# Patient Record
Sex: Female | Born: 1955 | Race: Black or African American | Hispanic: No | Smoking: Current every day smoker
Health system: Southern US, Community
[De-identification: ages and names within clinical notes are randomized; demographics above are authoritative.]

## PROBLEM LIST (undated history)

## (undated) DIAGNOSIS — I219 Acute myocardial infarction, unspecified: Secondary | ICD-10-CM

## (undated) DIAGNOSIS — R7303 Prediabetes: Secondary | ICD-10-CM

## (undated) DIAGNOSIS — Z87442 Personal history of urinary calculi: Secondary | ICD-10-CM

## (undated) DIAGNOSIS — N6315 Unspecified lump in the right breast, overlapping quadrants: Secondary | ICD-10-CM

## (undated) DIAGNOSIS — Z973 Presence of spectacles and contact lenses: Secondary | ICD-10-CM

## (undated) DIAGNOSIS — Z8619 Personal history of other infectious and parasitic diseases: Secondary | ICD-10-CM

## (undated) DIAGNOSIS — M1990 Unspecified osteoarthritis, unspecified site: Secondary | ICD-10-CM

## (undated) DIAGNOSIS — I10 Essential (primary) hypertension: Secondary | ICD-10-CM

## (undated) DIAGNOSIS — H9319 Tinnitus, unspecified ear: Secondary | ICD-10-CM

## (undated) DIAGNOSIS — E785 Hyperlipidemia, unspecified: Secondary | ICD-10-CM

## (undated) DIAGNOSIS — E119 Type 2 diabetes mellitus without complications: Secondary | ICD-10-CM

## (undated) HISTORY — DX: Essential (primary) hypertension: I10

## (undated) HISTORY — PX: LITHOTRIPSY: SUR834

## (undated) HISTORY — DX: Unspecified lump in the right breast, overlapping quadrants: N63.15

## (undated) HISTORY — PX: BREAST LUMPECTOMY: SHX2

---

## 1994-11-16 DIAGNOSIS — I219 Acute myocardial infarction, unspecified: Secondary | ICD-10-CM | POA: Insufficient documentation

## 1994-11-16 HISTORY — PX: CORONARY ANGIOPLASTY: SHX604

## 1994-11-16 HISTORY — DX: Acute myocardial infarction, unspecified: I21.9

## 2001-06-02 ENCOUNTER — Ambulatory Visit (HOSPITAL_COMMUNITY): Admission: RE | Admit: 2001-06-02 | Discharge: 2001-06-02 | Payer: Self-pay | Admitting: Internal Medicine

## 2001-06-02 ENCOUNTER — Encounter: Payer: Self-pay | Admitting: Internal Medicine

## 2001-06-16 ENCOUNTER — Other Ambulatory Visit: Admission: RE | Admit: 2001-06-16 | Discharge: 2001-06-16 | Payer: Self-pay | Admitting: Internal Medicine

## 2002-06-26 ENCOUNTER — Emergency Department (HOSPITAL_COMMUNITY): Admission: EM | Admit: 2002-06-26 | Discharge: 2002-06-26 | Payer: Self-pay | Admitting: Emergency Medicine

## 2002-06-26 ENCOUNTER — Encounter: Payer: Self-pay | Admitting: Emergency Medicine

## 2002-07-06 ENCOUNTER — Ambulatory Visit (HOSPITAL_COMMUNITY): Admission: RE | Admit: 2002-07-06 | Discharge: 2002-07-06 | Payer: Self-pay | Admitting: Urology

## 2002-07-06 ENCOUNTER — Encounter: Payer: Self-pay | Admitting: Urology

## 2002-07-21 ENCOUNTER — Ambulatory Visit (HOSPITAL_COMMUNITY): Admission: RE | Admit: 2002-07-21 | Discharge: 2002-07-21 | Payer: Self-pay | Admitting: Internal Medicine

## 2002-07-21 ENCOUNTER — Encounter: Payer: Self-pay | Admitting: Internal Medicine

## 2002-09-06 ENCOUNTER — Ambulatory Visit (HOSPITAL_COMMUNITY): Admission: RE | Admit: 2002-09-06 | Discharge: 2002-09-06 | Payer: Self-pay | Admitting: Urology

## 2002-09-06 ENCOUNTER — Encounter: Payer: Self-pay | Admitting: Urology

## 2003-08-09 ENCOUNTER — Ambulatory Visit (HOSPITAL_COMMUNITY): Admission: RE | Admit: 2003-08-09 | Discharge: 2003-08-09 | Payer: Self-pay | Admitting: Family Medicine

## 2003-08-09 ENCOUNTER — Encounter: Payer: Self-pay | Admitting: Family Medicine

## 2003-08-24 ENCOUNTER — Ambulatory Visit (HOSPITAL_COMMUNITY): Admission: RE | Admit: 2003-08-24 | Discharge: 2003-08-24 | Payer: Self-pay | Admitting: Family Medicine

## 2003-08-24 ENCOUNTER — Encounter: Payer: Self-pay | Admitting: Family Medicine

## 2003-09-11 ENCOUNTER — Ambulatory Visit (HOSPITAL_COMMUNITY): Admission: RE | Admit: 2003-09-11 | Discharge: 2003-09-11 | Payer: Self-pay | Admitting: General Surgery

## 2004-08-29 ENCOUNTER — Ambulatory Visit (HOSPITAL_COMMUNITY): Admission: RE | Admit: 2004-08-29 | Discharge: 2004-08-29 | Payer: Self-pay | Admitting: Family Medicine

## 2005-08-31 ENCOUNTER — Ambulatory Visit (HOSPITAL_COMMUNITY): Admission: RE | Admit: 2005-08-31 | Discharge: 2005-08-31 | Payer: Self-pay | Admitting: Family Medicine

## 2005-09-03 ENCOUNTER — Ambulatory Visit (HOSPITAL_COMMUNITY): Admission: RE | Admit: 2005-09-03 | Discharge: 2005-09-03 | Payer: Self-pay | Admitting: Family Medicine

## 2005-09-09 ENCOUNTER — Ambulatory Visit: Payer: Self-pay | Admitting: *Deleted

## 2005-11-19 ENCOUNTER — Ambulatory Visit (HOSPITAL_COMMUNITY): Admission: RE | Admit: 2005-11-19 | Discharge: 2005-11-19 | Payer: Self-pay | Admitting: General Surgery

## 2005-11-19 ENCOUNTER — Encounter (INDEPENDENT_AMBULATORY_CARE_PROVIDER_SITE_OTHER): Payer: Self-pay | Admitting: General Surgery

## 2005-12-31 ENCOUNTER — Ambulatory Visit (HOSPITAL_COMMUNITY): Admission: RE | Admit: 2005-12-31 | Discharge: 2005-12-31 | Payer: Self-pay | Admitting: Family Medicine

## 2006-01-06 ENCOUNTER — Ambulatory Visit: Payer: Self-pay | Admitting: Orthopedic Surgery

## 2006-12-07 ENCOUNTER — Ambulatory Visit (HOSPITAL_COMMUNITY): Admission: RE | Admit: 2006-12-07 | Discharge: 2006-12-07 | Payer: Self-pay | Admitting: Family Medicine

## 2006-12-07 ENCOUNTER — Ambulatory Visit: Payer: Self-pay | Admitting: Family Medicine

## 2006-12-13 ENCOUNTER — Encounter: Payer: Self-pay | Admitting: Family Medicine

## 2006-12-13 DIAGNOSIS — I2510 Atherosclerotic heart disease of native coronary artery without angina pectoris: Secondary | ICD-10-CM | POA: Insufficient documentation

## 2006-12-13 DIAGNOSIS — I252 Old myocardial infarction: Secondary | ICD-10-CM | POA: Insufficient documentation

## 2006-12-13 DIAGNOSIS — E785 Hyperlipidemia, unspecified: Secondary | ICD-10-CM | POA: Insufficient documentation

## 2006-12-13 DIAGNOSIS — M1990 Unspecified osteoarthritis, unspecified site: Secondary | ICD-10-CM | POA: Insufficient documentation

## 2006-12-13 DIAGNOSIS — Z860100 Personal history of colon polyps, unspecified: Secondary | ICD-10-CM | POA: Insufficient documentation

## 2006-12-13 DIAGNOSIS — I10 Essential (primary) hypertension: Secondary | ICD-10-CM | POA: Insufficient documentation

## 2006-12-13 DIAGNOSIS — F17200 Nicotine dependence, unspecified, uncomplicated: Secondary | ICD-10-CM | POA: Insufficient documentation

## 2006-12-13 LAB — CONVERTED CEMR LAB
ALT: 36 units/L — ABNORMAL HIGH (ref 0–35)
AST: 19 units/L (ref 0–37)
Albumin: 3.8 g/dL (ref 3.5–5.2)
Alkaline Phosphatase: 206 units/L — ABNORMAL HIGH (ref 39–117)
BUN: 12 mg/dL (ref 6–23)
Basophils Absolute: 0 10*3/uL (ref 0.0–0.1)
Basophils Relative: 1 % (ref 0–1)
CO2: 21 meq/L (ref 19–32)
Calcium: 9 mg/dL (ref 8.4–10.5)
Chloride: 104 meq/L (ref 96–112)
Cholesterol: 146 mg/dL (ref 0–200)
Creatinine, Ser: 0.75 mg/dL (ref 0.40–1.20)
Eosinophils Absolute: 0.1 10*3/uL (ref 0.0–0.7)
Eosinophils Relative: 2 % (ref 0–5)
Glucose, Bld: 115 mg/dL — ABNORMAL HIGH (ref 70–99)
HCT: 42.4 % (ref 36.0–46.0)
HDL: 38 mg/dL — ABNORMAL LOW (ref >39)
Hemoglobin: 14.3 g/dL (ref 12.0–15.0)
LDL Cholesterol: 87 mg/dL (ref 0–99)
Lymphocytes Relative: 31 % (ref 12–46)
Lymphs Abs: 2.4 10*3/uL (ref 0.7–3.3)
MCHC: 33.7 g/dL (ref 30.0–36.0)
MCV: 89.3 fL (ref 78.0–100.0)
Monocytes Absolute: 0.5 10*3/uL (ref 0.2–0.7)
Monocytes Relative: 6 % (ref 3–11)
Neutro Abs: 4.7 10*3/uL (ref 1.7–7.7)
Neutrophils Relative %: 61 % (ref 43–77)
Platelets: 236 10*3/uL (ref 150–400)
Potassium: 3.8 meq/L (ref 3.5–5.3)
RBC: 4.75 M/uL (ref 3.87–5.11)
RDW: 14.2 % — ABNORMAL HIGH (ref 11.5–14.0)
Sodium: 141 meq/L (ref 135–145)
TSH: 2.422 microintl units/mL (ref 0.350–5.50)
Total Bilirubin: 0.5 mg/dL (ref 0.3–1.2)
Total CHOL/HDL Ratio: 3.8
Total Protein: 6.8 g/dL (ref 6.0–8.3)
Triglycerides: 105 mg/dL (ref <150)
VLDL: 21 mg/dL (ref 0–40)
WBC: 7.7 10*3/uL (ref 4.0–10.5)

## 2007-01-13 ENCOUNTER — Encounter (INDEPENDENT_AMBULATORY_CARE_PROVIDER_SITE_OTHER): Payer: Self-pay | Admitting: Family Medicine

## 2008-07-13 ENCOUNTER — Ambulatory Visit: Payer: Self-pay | Admitting: Family Medicine

## 2008-07-13 LAB — CONVERTED CEMR LAB
Bilirubin Urine: NEGATIVE
Cholesterol, target level: 200 mg/dL
Glucose, Urine, Semiquant: NEGATIVE
HDL goal, serum: 40 mg/dL
Ketones, urine, test strip: NEGATIVE
LDL Goal: 100 mg/dL
Nitrite: NEGATIVE
Protein, U semiquant: 30
Specific Gravity, Urine: 1.015
Urobilinogen, UA: 0.2
pH: 6

## 2008-07-14 ENCOUNTER — Encounter (INDEPENDENT_AMBULATORY_CARE_PROVIDER_SITE_OTHER): Payer: Self-pay | Admitting: Family Medicine

## 2008-07-17 LAB — CONVERTED CEMR LAB
Bilirubin Urine: NEGATIVE
Ketones, ur: NEGATIVE mg/dL
Leukocytes, UA: NEGATIVE
Nitrite: NEGATIVE
Protein, ur: NEGATIVE mg/dL
Specific Gravity, Urine: 1.013 (ref 1.005–1.03)
Urine Glucose: NEGATIVE mg/dL
Urobilinogen, UA: 0.2 (ref 0.0–1.0)
pH: 6 (ref 5.0–8.0)

## 2008-07-20 ENCOUNTER — Encounter (INDEPENDENT_AMBULATORY_CARE_PROVIDER_SITE_OTHER): Payer: Self-pay | Admitting: Family Medicine

## 2008-07-27 LAB — CONVERTED CEMR LAB
ALT: 18 units/L (ref 0–35)
AST: 16 units/L (ref 0–37)
Albumin: 3.9 g/dL (ref 3.5–5.2)
Alkaline Phosphatase: 176 units/L — ABNORMAL HIGH (ref 39–117)
BUN: 10 mg/dL (ref 6–23)
Basophils Absolute: 0.1 10*3/uL (ref 0.0–0.1)
Basophils Relative: 1 % (ref 0–1)
CO2: 25 meq/L (ref 19–32)
Calcium: 9.2 mg/dL (ref 8.4–10.5)
Chloride: 106 meq/L (ref 96–112)
Cholesterol: 316 mg/dL — ABNORMAL HIGH (ref 0–200)
Creatinine, Ser: 0.92 mg/dL (ref 0.40–1.20)
Eosinophils Absolute: 0.1 10*3/uL (ref 0.0–0.7)
Eosinophils Relative: 2 % (ref 0–5)
Glucose, Bld: 117 mg/dL — ABNORMAL HIGH (ref 70–99)
HCT: 46.7 % — ABNORMAL HIGH (ref 36.0–46.0)
HDL: 45 mg/dL (ref >39)
Hemoglobin: 15.2 g/dL — ABNORMAL HIGH (ref 12.0–15.0)
LDL Cholesterol: 242 mg/dL — ABNORMAL HIGH (ref 0–99)
Lymphocytes Relative: 43 % (ref 12–46)
Lymphs Abs: 3 10*3/uL (ref 0.7–4.0)
MCHC: 32.5 g/dL (ref 30.0–36.0)
MCV: 89 fL (ref 78.0–100.0)
Monocytes Absolute: 0.5 10*3/uL (ref 0.1–1.0)
Monocytes Relative: 7 % (ref 3–12)
Neutro Abs: 3.4 10*3/uL (ref 1.7–7.7)
Neutrophils Relative %: 48 % (ref 43–77)
Platelets: 231 10*3/uL (ref 150–400)
Potassium: 4.2 meq/L (ref 3.5–5.3)
RBC: 5.25 M/uL — ABNORMAL HIGH (ref 3.87–5.11)
RDW: 14.9 % (ref 11.5–15.5)
Sodium: 141 meq/L (ref 135–145)
TSH: 2.584 microintl units/mL (ref 0.350–4.50)
Total Bilirubin: 0.5 mg/dL (ref 0.3–1.2)
Total CHOL/HDL Ratio: 7
Total Protein: 7.3 g/dL (ref 6.0–8.3)
Triglycerides: 147 mg/dL (ref <150)
VLDL: 29 mg/dL (ref 0–40)
WBC: 7.1 10*3/uL (ref 4.0–10.5)

## 2008-07-31 ENCOUNTER — Other Ambulatory Visit: Admission: RE | Admit: 2008-07-31 | Discharge: 2008-07-31 | Payer: Self-pay | Admitting: Family Medicine

## 2008-07-31 ENCOUNTER — Encounter (INDEPENDENT_AMBULATORY_CARE_PROVIDER_SITE_OTHER): Payer: Self-pay | Admitting: Family Medicine

## 2008-07-31 ENCOUNTER — Ambulatory Visit: Payer: Self-pay | Admitting: Family Medicine

## 2008-07-31 DIAGNOSIS — Z87442 Personal history of urinary calculi: Secondary | ICD-10-CM | POA: Insufficient documentation

## 2008-07-31 DIAGNOSIS — D751 Secondary polycythemia: Secondary | ICD-10-CM | POA: Insufficient documentation

## 2008-07-31 DIAGNOSIS — R748 Abnormal levels of other serum enzymes: Secondary | ICD-10-CM | POA: Insufficient documentation

## 2008-07-31 DIAGNOSIS — R3129 Other microscopic hematuria: Secondary | ICD-10-CM | POA: Insufficient documentation

## 2008-07-31 LAB — CONVERTED CEMR LAB
Glucose, Urine, Semiquant: NEGATIVE
Ketones, urine, test strip: NEGATIVE
LDL Goal: 70 mg/dL
Nitrite: NEGATIVE
Protein, U semiquant: 30
Specific Gravity, Urine: 1.015
Urobilinogen, UA: 1
pH: 6.5

## 2008-08-02 ENCOUNTER — Ambulatory Visit (HOSPITAL_COMMUNITY): Admission: RE | Admit: 2008-08-02 | Discharge: 2008-08-02 | Payer: Self-pay | Admitting: *Deleted

## 2008-08-02 ENCOUNTER — Encounter (INDEPENDENT_AMBULATORY_CARE_PROVIDER_SITE_OTHER): Payer: Self-pay | Admitting: Family Medicine

## 2008-08-02 DIAGNOSIS — M948X9 Other specified disorders of cartilage, unspecified sites: Secondary | ICD-10-CM | POA: Insufficient documentation

## 2008-08-08 ENCOUNTER — Ambulatory Visit (HOSPITAL_COMMUNITY): Admission: RE | Admit: 2008-08-08 | Discharge: 2008-08-08 | Payer: Self-pay | Admitting: Family Medicine

## 2008-08-24 ENCOUNTER — Ambulatory Visit (HOSPITAL_COMMUNITY): Admission: RE | Admit: 2008-08-24 | Discharge: 2008-08-24 | Payer: Self-pay | Admitting: Urology

## 2008-08-29 ENCOUNTER — Ambulatory Visit: Payer: Self-pay | Admitting: Family Medicine

## 2008-08-30 ENCOUNTER — Encounter (INDEPENDENT_AMBULATORY_CARE_PROVIDER_SITE_OTHER): Payer: Self-pay | Admitting: Family Medicine

## 2008-08-31 LAB — CONVERTED CEMR LAB
ALT: 17 units/L (ref 0–35)
AST: 15 units/L (ref 0–37)
Albumin: 4.2 g/dL (ref 3.5–5.2)
Alkaline Phosphatase: 187 units/L — ABNORMAL HIGH (ref 39–117)
BUN: 10 mg/dL (ref 6–23)
CO2: 22 meq/L (ref 19–32)
Calcium: 10 mg/dL (ref 8.4–10.5)
Chloride: 102 meq/L (ref 96–112)
Creatinine, Ser: 0.88 mg/dL (ref 0.40–1.20)
Glucose, Bld: 118 mg/dL — ABNORMAL HIGH (ref 70–99)
Potassium: 3.8 meq/L (ref 3.5–5.3)
Sodium: 136 meq/L (ref 135–145)
Total Bilirubin: 0.4 mg/dL (ref 0.3–1.2)
Total Protein: 7.3 g/dL (ref 6.0–8.3)

## 2008-10-10 ENCOUNTER — Ambulatory Visit: Payer: Self-pay | Admitting: Family Medicine

## 2008-10-16 LAB — CONVERTED CEMR LAB
ALT: 14 units/L (ref 0–35)
AST: 15 units/L (ref 0–37)
Albumin: 3.9 g/dL (ref 3.5–5.2)
Alkaline Phosphatase: 169 units/L — ABNORMAL HIGH (ref 39–117)
BUN: 10 mg/dL (ref 6–23)
CO2: 22 meq/L (ref 19–32)
Calcium: 9.3 mg/dL (ref 8.4–10.5)
Chloride: 105 meq/L (ref 96–112)
Cholesterol: 155 mg/dL (ref 0–200)
Creatinine, Ser: 0.89 mg/dL (ref 0.40–1.20)
Glucose, Bld: 110 mg/dL — ABNORMAL HIGH (ref 70–99)
HDL: 34 mg/dL — ABNORMAL LOW (ref >39)
LDL Cholesterol: 100 mg/dL — ABNORMAL HIGH (ref 0–99)
Potassium: 3.8 meq/L (ref 3.5–5.3)
Sodium: 140 meq/L (ref 135–145)
Total Bilirubin: 0.4 mg/dL (ref 0.3–1.2)
Total CHOL/HDL Ratio: 4.6
Total Protein: 7.1 g/dL (ref 6.0–8.3)
Triglycerides: 105 mg/dL (ref <150)
VLDL: 21 mg/dL (ref 0–40)

## 2009-12-10 ENCOUNTER — Ambulatory Visit (HOSPITAL_COMMUNITY): Admission: RE | Admit: 2009-12-10 | Discharge: 2009-12-10 | Payer: Self-pay | Admitting: Internal Medicine

## 2010-12-06 ENCOUNTER — Encounter: Payer: Self-pay | Admitting: General Surgery

## 2010-12-08 ENCOUNTER — Encounter: Payer: Self-pay | Admitting: Family Medicine

## 2011-04-03 NOTE — H&P (Signed)
NAME:  Leah [name], Leah [name]                 ACCOUNT NO.:  [id number]   MEDICAL RECORD NO.:  [id number]          PATIENT TYPE:  AMB   LOCATION:  DAY                           FACILITY:  [facility]   PHYSICIAN:  Jerolyn Shin C. Katrinka Blazing, M.D.   DATE OF BIRTH:  December 26, 1955   DATE OF ADMISSION:  DATE OF DISCHARGE:  LH                                HISTORY & PHYSICAL   HISTORY:  A 55 year old female referred for screening colonoscopy.  She has  no abdominal pain.  No family history of colon cancer or colon problems.  Negative stool guaiac's.  She is scheduled for routine initial screening  colonoscopy.   PAST HISTORY:  She has atherosclerotic heart disease status post right  coronary artery stenting.  She also has hypertension.   SURGERY:  Right breast biopsy.   MEDICATIONS:  1.  Lipitor 80 mg daily.  2.  Hyzaar 80/12.5 mg daily.  3.  Aspirin 81 mg daily.   EXAMINATION:  VITAL SIGNS:  Blood pressure 136/76, pulse 68, respirations  20, weight 239 pounds.  HEENT:  Unremarkable.  NECK:  Supple.  No JVD, bruit, adenopathy, or thyromegaly.  CHEST:  Clear to auscultation.  HEART:  Regular rate and rhythm without murmur, gallop, or rub.  ABDOMEN:  Soft, nontender; no masses.  RECTAL:  Normal sphincter tone; no masses.  Stool guaiac negative.  EXTREMITIES:  No cyanosis, clubbing, or edema.  NEUROLOGIC:  No focal motor, sensory, or cerebellar deficit.   IMPRESSION:  1.  Need for screening colonoscopy.  2.  Atherosclerotic heart disease, stable.  3.  Hypertension.   PLAN:  Screening colonoscopy.      Dirk Dress. Katrinka Blazing, M.D.  Electronically Signed     LCS/MEDQ  D:  11/18/2005  T:  11/18/2005  Job:  [id number]

## 2011-04-03 NOTE — H&P (Signed)
   NAME:  Leah [name], Leah [name]                           ACCOUNT NO.:  [id number]   MEDICAL RECORD NO.:  [id number]                   PATIENT TYPE:  AMB   LOCATION:  DAY                                  FACILITY:  [facility]   PHYSICIAN:  Leroy C. Katrinka Blazing, M.D.                DATE OF BIRTH:  1956/04/30   DATE OF ADMISSION:  DATE OF DISCHARGE:                                HISTORY & PHYSICAL   HISTORY OF PRESENT ILLNESS:  [age]-year-old female with [name] history of  finding of [name] 7 x 5 x 6-mm nodule of her right breast.  The patient was given  the option of having ultrasound-guided biopsy or needle localization with  excision.  The patient opted for excision.   PAST HISTORY:  She has coronary artery disease, status post myocardial  infarction, status post stenting of right coronary artery in 1998.  She has  been very vigorous in her activity and is asymptomatic.  Other problems  include hypertension, hyperlipidemia, osteoarthritis and history of kidney  stones.   MEDICATIONS:  1. Toprol-XL 50 mg one and [name] half tablets daily.  2. Lipitor 20 mg daily.  3. Hyzaar 50/12.5 mg daily.  4. Aspirin 81 mg daily.   SURGERY:  None.   ALLERGIES:  CODEINE.   SOCIAL HISTORY:  The patient smokes one pack of cigarettes per day.   PHYSICAL EXAMINATION:  VITAL SIGNS:  Blood pressure 150/78, pulse 88,  respirations 20.  Weight 239 pounds.  HEENT:  Unremarkable.  NECK:  Neck is supple without JVD or bruit.  CHEST:  Chest clear to auscultation.  HEART:  Heart regular rate and rhythm without murmur, gallop or rub.  BREASTS:  Macromastia without dominant masses.  ABDOMEN:  Abdomen obese, soft, nontender.  No masses.  Normoactive bowel  sounds.  EXTREMITIES:  No cyanosis, clubbing or edema.  NEUROLOGIC:  No focal motor, sensory or cerebellar deficit.   IMPRESSION:  1. Right breast mass.  2. Atherosclerotic heart disease, status post myocardial infarction, status     post stenting of right coronary artery,  symptomatic.  3. Hypertension.  4.     Osteoarthritis.  5. Hyperlipidemia.   PLAN:  Needle localization and right partial mastectomy.     ___________________________________________                                         Dirk Dress Katrinka Blazing, M.D.   LCS/MEDQ  D:  09/10/2003  T:  09/11/2003  Job:  [id number]

## 2011-04-03 NOTE — Op Note (Signed)
   NAME:  [name], Leah A                           ACCOUNT NO.:  [id number]   MEDICAL RECORD NO.:  [id number]                   PATIENT TYPE:  AMB   LOCATION:  DAY                                  FACILITY:  [facility]   PHYSICIAN:  Leroy C. Katrinka Blazing, M.D.                DATE OF BIRTH:  05-24-1956   DATE OF PROCEDURE:  DATE OF DISCHARGE:                                 OPERATIVE REPORT   PREOPERATIVE DIAGNOSIS:  Right breast mass.   POSTOPERATIVE DIAGNOSIS:  Right breast mass.   PROCEDURE:  Right partial mastectomy.   SURGEON:  Dirk Dress. Katrinka Blazing, M.D.   DESCRIPTION OF PROCEDURE:  The patient was taken to radiology prior to the  procedure to have needle localization.  The needle localization was done;  however, only one view was sent.  This was what appeared to be a true  lateral film.  The needle, however, was somewhat serpiginous in its course,  and it was very difficult to determine exactly where the needle was in  reference to the lesion.  Nevertheless, the patient was prepped and draped  in a sterile field.   The portable C-arm was brought in, and I attempted to localize the lesion in  two views, but intensity was not adequate to see the wire in reference to  the lesion.  Therefore, a curvilinear incision was made in the upper outer  quadrant between 1 and 3 o'clock.  The lesion was followed down along what I  thought might be the course of the wire.  While dissecting about 2 cm away  from the course of the wire, I came in contact with the curved edge of the  wire.  I therefore did a much wider dissection circumferentially around the  wire, and I extended the dissection about 2 cm distal to the tip of the wire  in order to try to encompass the mass.  Once the mass was excised, it was  sent to radiology.  The radiologist report was that the specimen contained  the original lesion.  The wound was therefore closed using 2-0 Biosyn and 3-  0 Biosyn without difficulty.  The skin was closed  with subcuticular 4-0  Dexon.  A dressing was placed.   The patient was awakened from anesthesia uneventfully, transferred to a bed,  and taken to the postanesthetic care unit.      ___________________________________________                                            Dirk Dress. Katrinka Blazing, M.D.   LCS/MEDQ  D:  09/11/2003  T:  09/11/2003  Job:  [id number]

## 2011-08-31 ENCOUNTER — Other Ambulatory Visit: Payer: Self-pay | Admitting: Obstetrics & Gynecology

## 2011-08-31 ENCOUNTER — Other Ambulatory Visit (HOSPITAL_COMMUNITY)
Admission: RE | Admit: 2011-08-31 | Discharge: 2011-08-31 | Disposition: A | Discharge status: Home / Self Care | Payer: BC Managed Care – PPO | Source: Ambulatory Visit | Attending: Obstetrics & Gynecology | Admitting: Obstetrics & Gynecology

## 2011-08-31 DIAGNOSIS — Z01419 Encounter for gynecological examination (general) (routine) without abnormal findings: Secondary | ICD-10-CM | POA: Insufficient documentation

## 2015-05-09 ENCOUNTER — Other Ambulatory Visit: Payer: Self-pay | Admitting: Obstetrics & Gynecology

## 2015-05-10 ENCOUNTER — Ambulatory Visit (INDEPENDENT_AMBULATORY_CARE_PROVIDER_SITE_OTHER): Payer: BLUE CROSS/BLUE SHIELD | Admitting: Obstetrics & Gynecology

## 2015-05-10 ENCOUNTER — Encounter: Payer: Self-pay | Admitting: Obstetrics & Gynecology

## 2015-05-10 ENCOUNTER — Other Ambulatory Visit (HOSPITAL_COMMUNITY)
Admission: RE | Admit: 2015-05-10 | Discharge: 2015-05-10 | Disposition: A | Discharge status: Home / Self Care | Payer: BLUE CROSS/BLUE SHIELD | Source: Ambulatory Visit | Attending: Obstetrics & Gynecology | Admitting: Obstetrics & Gynecology

## 2015-05-10 ENCOUNTER — Other Ambulatory Visit (HOSPITAL_COMMUNITY): Payer: Self-pay | Admitting: Internal Medicine

## 2015-05-10 VITALS — BP 148/78 | HR 60 | Ht 63.0 in | Wt 194.5 lb

## 2015-05-10 DIAGNOSIS — Z1231 Encounter for screening mammogram for malignant neoplasm of breast: Secondary | ICD-10-CM

## 2015-05-10 DIAGNOSIS — Z1212 Encounter for screening for malignant neoplasm of rectum: Secondary | ICD-10-CM

## 2015-05-10 DIAGNOSIS — Z01411 Encounter for gynecological examination (general) (routine) with abnormal findings: Secondary | ICD-10-CM | POA: Diagnosis not present

## 2015-05-10 DIAGNOSIS — Z1211 Encounter for screening for malignant neoplasm of colon: Secondary | ICD-10-CM

## 2015-05-10 DIAGNOSIS — Z01419 Encounter for gynecological examination (general) (routine) without abnormal findings: Secondary | ICD-10-CM

## 2015-05-10 DIAGNOSIS — Z1151 Encounter for screening for human papillomavirus (HPV): Secondary | ICD-10-CM | POA: Diagnosis present

## 2015-05-10 NOTE — Progress Notes (Signed)
Patient ID: [name], female   DOB: 10/24/1956, 59 y.o.   MRN: [id number] Subjective:     KASUMI DITULLIO is a 59 y.o. female here for a routine exam.  No LMP recorded. Patient is postmenopausal. No obstetric history on file. Birth Control Method:  none Menstrual Calendar(currently): post menopausal since 2003  Current complaints: none, some GI upset today.   Current acute medical issues:  none   Recent Gynecologic History No LMP recorded. Patient is postmenopausal. Last Pap: 2012  normal Last mammogram: 2009,  normal  Past Medical History  Diagnosis Date  . Hypertension   . Herpes     Past Surgical History  Procedure Laterality Date  . Breast lumpectomy      OB History    No data available      History   Social History  . Marital Status: Married    Spouse Name: N/A  . Number of Children: N/A  . Years of Education: N/A   Social History Main Topics  . Smoking status: Current Every Day Smoker -- 1.00 packs/day    Types: Cigarettes  . Smokeless tobacco: Never Used  . Alcohol Use: No  . Drug Use: No  . Sexual Activity: Not on file   Other Topics Concern  . None   Social History Narrative  . None    Family History  Problem Relation Age of Onset  . Heart disease Mother   . Diabetes Mother   . Cancer Father     bone  . Stroke Maternal Grandmother      Current outpatient prescriptions:  .  aspirin 81 MG tablet, Take 81 mg by mouth daily., Disp: , Rfl:  .  Cholecalciferol (VITAMIN D-3) 1000 UNITS CAPS, Take 1 capsule by mouth 2 (two) times daily., Disp: , Rfl:  .  Cyanocobalamin (VITAMIN B 12 PO), Take 1 tablet by mouth daily., Disp: , Rfl:  .  folic acid (FOLVITE) 295 MCG tablet, Take 800 mcg by mouth daily., Disp: , Rfl:  .  metoprolol (LOPRESSOR) 50 MG tablet, Take 50 mg by mouth 2 (two) times daily. , Disp: , Rfl: 2 .  Selenium 200 MCG CAPS, Take 1 capsule by mouth daily., Disp: , Rfl:  .  vitamin E 400 UNIT capsule, Take 400 Units by mouth daily.,  Disp: , Rfl:   Review of Systems  Review of Systems  Constitutional: Negative for fever, chills, weight loss, malaise/fatigue and diaphoresis.  HENT: Negative for hearing loss, ear pain, nosebleeds, congestion, sore throat, neck pain, tinnitus and ear discharge.   Eyes: Negative for blurred vision, double vision, photophobia, pain, discharge and redness.  Respiratory: Negative for cough, hemoptysis, sputum production, shortness of breath, wheezing and stridor.   Cardiovascular: Negative for chest pain, palpitations, orthopnea, claudication, leg swelling and PND.  Gastrointestinal: negative for abdominal pain. Negative for heartburn, nausea, vomiting, diarrhea, constipation, blood in stool and melena.  Genitourinary: Negative for dysuria, urgency, frequency, hematuria and flank pain.  Musculoskeletal: Negative for myalgias, back pain, joint pain and falls.  Skin: Negative for itching and rash.  Neurological: Negative for dizziness, tingling, tremors, sensory change, speech change, focal weakness, seizures, loss of consciousness, weakness and headaches.  Endo/Heme/Allergies: Negative for environmental allergies and polydipsia. Does not bruise/bleed easily.  Psychiatric/Behavioral: Negative for depression, suicidal ideas, hallucinations, memory loss and substance abuse. The patient is not nervous/anxious and does not have insomnia.        Objective:  Blood pressure 148/78, pulse 60, height 5\' 3"  (  1.6 m), weight 194 lb 8 oz (88.225 kg).   Physical Exam  Vitals reviewed. Constitutional: She is oriented to person, place, and time. She appears well-developed and well-nourished.  HENT:  Head: Normocephalic and atraumatic.        Right Ear: External ear normal.  Left Ear: External ear normal.  Nose: Nose normal.  Mouth/Throat: Oropharynx is clear and moist.  Eyes: Conjunctivae and EOM are normal. Pupils are equal, round, and reactive to light. Right eye exhibits no discharge. Left eye exhibits  no discharge. No scleral icterus.  Neck: Normal range of motion. Neck supple. No tracheal deviation present. No thyromegaly present.  Cardiovascular: Normal rate, regular rhythm, normal heart sounds and intact distal pulses.  Exam reveals no gallop and no friction rub.   No murmur heard. Respiratory: Effort normal and breath sounds normal. No respiratory distress. She has no wheezes. She has no rales. She exhibits no tenderness.  GI: Soft. Bowel sounds are normal. She exhibits no distension and no mass. There is no tenderness. There is no rebound and no guarding.  Genitourinary:  Breasts no masses skin changes or nipple changes bilaterally      Vulva is normal without lesions Vagina is pink moist without discharge Cervix normal in appearance and pap is done Uterus is normal size shape and contour Adnexa is negative with normal sized ovaries  {Rectal    hemoccult negative, normal tone, no masses  Musculoskeletal: Normal range of motion. She exhibits no edema and no tenderness.  Neurological: She is alert and oriented to person, place, and time. She has normal reflexes. She displays normal reflexes. No cranial nerve deficit. She exhibits normal muscle tone. Coordination normal.  Skin: Skin is warm and dry. No rash noted. No erythema. No pallor.  Psychiatric: She has a normal mood and affect. Her behavior is normal. Judgment and thought content normal.       Assessment:    Healthy female exam.    Plan:    Follow up in: 2 years.

## 2015-05-13 LAB — CYTOLOGY - PAP

## 2015-05-24 ENCOUNTER — Ambulatory Visit (HOSPITAL_COMMUNITY)
Admission: RE | Admit: 2015-05-24 | Discharge: 2015-05-24 | Disposition: A | Discharge status: Home / Self Care | Payer: BLUE CROSS/BLUE SHIELD | Source: Ambulatory Visit | Attending: Internal Medicine | Admitting: Internal Medicine

## 2015-05-24 DIAGNOSIS — Z1231 Encounter for screening mammogram for malignant neoplasm of breast: Secondary | ICD-10-CM | POA: Diagnosis present

## 2015-08-21 ENCOUNTER — Encounter (HOSPITAL_COMMUNITY): Payer: Self-pay | Admitting: Emergency Medicine

## 2015-08-21 ENCOUNTER — Emergency Department (HOSPITAL_COMMUNITY): Payer: BLUE CROSS/BLUE SHIELD

## 2015-08-21 ENCOUNTER — Emergency Department (HOSPITAL_COMMUNITY)
Admission: EM | Admit: 2015-08-21 | Discharge: 2015-08-21 | Disposition: A | Discharge status: Home / Self Care | Payer: BLUE CROSS/BLUE SHIELD | Attending: Emergency Medicine | Admitting: Emergency Medicine

## 2015-08-21 DIAGNOSIS — Z8619 Personal history of other infectious and parasitic diseases: Secondary | ICD-10-CM | POA: Insufficient documentation

## 2015-08-21 DIAGNOSIS — Z79899 Other long term (current) drug therapy: Secondary | ICD-10-CM | POA: Diagnosis not present

## 2015-08-21 DIAGNOSIS — R109 Unspecified abdominal pain: Secondary | ICD-10-CM

## 2015-08-21 DIAGNOSIS — Z7982 Long term (current) use of aspirin: Secondary | ICD-10-CM | POA: Diagnosis not present

## 2015-08-21 DIAGNOSIS — Z720 Tobacco use: Secondary | ICD-10-CM | POA: Diagnosis not present

## 2015-08-21 DIAGNOSIS — R1031 Right lower quadrant pain: Secondary | ICD-10-CM | POA: Insufficient documentation

## 2015-08-21 DIAGNOSIS — I10 Essential (primary) hypertension: Secondary | ICD-10-CM | POA: Insufficient documentation

## 2015-08-21 LAB — CBC WITH DIFFERENTIAL/PLATELET
Basophils Absolute: 0.1 10*3/uL (ref 0.0–0.1)
Basophils Relative: 1 %
EOS ABS: 0.1 10*3/uL (ref 0.0–0.7)
EOS PCT: 1 %
HCT: 46.6 % — ABNORMAL HIGH (ref 36.0–46.0)
Hemoglobin: 16.3 g/dL — ABNORMAL HIGH (ref 12.0–15.0)
LYMPHS ABS: 1.9 10*3/uL (ref 0.7–4.0)
LYMPHS PCT: 31 %
MCH: 30.9 pg (ref 26.0–34.0)
MCHC: 35 g/dL (ref 30.0–36.0)
MCV: 88.3 fL (ref 78.0–100.0)
MONO ABS: 0.3 10*3/uL (ref 0.1–1.0)
MONOS PCT: 5 %
Neutro Abs: 3.8 10*3/uL (ref 1.7–7.7)
Neutrophils Relative %: 62 %
PLATELETS: 200 10*3/uL (ref 150–400)
RBC: 5.28 MIL/uL — ABNORMAL HIGH (ref 3.87–5.11)
RDW: 13.8 % (ref 11.5–15.5)
WBC: 6.2 10*3/uL (ref 4.0–10.5)

## 2015-08-21 LAB — COMPREHENSIVE METABOLIC PANEL
ALK PHOS: 130 U/L — AB (ref 38–126)
ALT: 17 U/L (ref 14–54)
AST: 21 U/L (ref 15–41)
Albumin: 3.9 g/dL (ref 3.5–5.0)
Anion gap: 11 (ref 5–15)
BUN: 9 mg/dL (ref 6–20)
CALCIUM: 8.7 mg/dL — AB (ref 8.9–10.3)
CHLORIDE: 105 mmol/L (ref 101–111)
CO2: 23 mmol/L (ref 22–32)
CREATININE: 0.82 mg/dL (ref 0.44–1.00)
Glucose, Bld: 105 mg/dL — ABNORMAL HIGH (ref 65–99)
Potassium: 4 mmol/L (ref 3.5–5.1)
SODIUM: 139 mmol/L (ref 135–145)
Total Bilirubin: 0.5 mg/dL (ref 0.3–1.2)
Total Protein: 7.6 g/dL (ref 6.5–8.1)

## 2015-08-21 LAB — LIPASE, BLOOD: Lipase: 21 U/L — ABNORMAL LOW (ref 22–51)

## 2015-08-21 MED ORDER — IOHEXOL 300 MG/ML  SOLN
100.0000 mL | Freq: Once | INTRAMUSCULAR | Status: AC | PRN
  Administered 2015-08-21: 100 mL via INTRAVENOUS

## 2015-08-21 MED ORDER — ONDANSETRON HCL 4 MG/2ML IJ SOLN
4.0000 mg | Freq: Once | INTRAMUSCULAR | Status: AC
  Administered 2015-08-21: 4 mg via INTRAVENOUS

## 2015-08-21 MED ORDER — ACETAMINOPHEN 325 MG PO TABS
650.0000 mg | ORAL_TABLET | Freq: Once | ORAL | Status: AC
Start: 2015-08-21 — End: 2015-08-21
  Administered 2015-08-21: 650 mg via ORAL
  Filled 2015-08-21: qty 2

## 2015-08-21 MED ORDER — ONDANSETRON HCL 4 MG/2ML IJ SOLN
4.0000 mg | Freq: Once | INTRAMUSCULAR | Status: DC
  Filled 2015-08-21: qty 2

## 2015-08-21 NOTE — ED Notes (Signed)
Assumed care of patient from [place], [place]. Pt in CT at this time.

## 2015-08-21 NOTE — ED Provider Notes (Signed)
CSN: [id number]     Arrival date & time 08/21/15  1307 History   First MD Initiated Contact with Patient 08/21/15 1605     Chief Complaint  Patient presents with  . Abdominal Pain     (Consider location/radiation/quality/duration/timing/severity/associated sxs/prior Treatment) Patient is a 59 y.o. female presenting with abdominal pain. The history is provided by the patient.  Abdominal Pain Associated symptoms: no chest pain, no diarrhea, no nausea, no shortness of breath and no vomiting    patient has had right lower quadrant abdominal pain for the last month. It'll come and go. Seems to be relieved by having a bowel movement. No fevers. No nausea vomiting. States her stool has been a little darker. She's been treated by her primary care doctor, Dr. Anastasio Champion and he was thinking it was constipation. No real weight loss. States pain is always in the same spot and goes away.  States that today it did not go away with a bowel movement.  Past Medical History  Diagnosis Date  . Hypertension   . Herpes    Past Surgical History  Procedure Laterality Date  . Breast lumpectomy     Family History  Problem Relation Age of Onset  . Heart disease Mother   . Diabetes Mother   . Cancer Father     bone  . Stroke Maternal Grandmother    Social History  Substance Use Topics  . Smoking status: Current Every Day Smoker -- 1.00 packs/day    Types: Cigarettes  . Smokeless tobacco: Never Used  . Alcohol Use: No   OB History    No data available     Review of Systems  Constitutional: Negative for activity change and appetite change.  Eyes: Negative for pain.  Respiratory: Negative for chest tightness and shortness of breath.   Cardiovascular: Negative for chest pain and leg swelling.  Gastrointestinal: Positive for abdominal pain. Negative for nausea, vomiting and diarrhea.  Genitourinary: Negative for flank pain.  Musculoskeletal: Negative for back pain and neck stiffness.  Skin: Negative  for rash.  Neurological: Negative for weakness, numbness and headaches.  Psychiatric/Behavioral: Negative for behavioral problems.      Allergies  Codeine  Home Medications   Prior to Admission medications   Medication Sig Start Date End Date Taking? Authorizing Provider  aspirin EC 81 MG tablet Take 81 mg by mouth daily.   Yes Historical Provider, MD  Cholecalciferol (VITAMIN D-3) 1000 UNITS CAPS Take 1,000 Units by mouth daily.    Yes Historical Provider, MD  metoprolol (LOPRESSOR) 50 MG tablet Take 50 mg by mouth 2 (two) times daily.  04/30/15  Yes Historical Provider, MD  vitamin B-12 (CYANOCOBALAMIN) 1000 MCG tablet Take 1,000 mcg by mouth daily.   Yes Historical Provider, MD  vitamin E 400 UNIT capsule Take 400 Units by mouth daily.   Yes Historical Provider, MD   BP 165/63 mmHg  Pulse 61  Temp(Src) 97.8 F (36.6 C) (Oral)  Resp 20  Ht 5\' 3"  (1.6 m)  Wt 190 lb (86.183 kg)  BMI 33.67 kg/m2  SpO2 100% Physical Exam  Constitutional: She is oriented to person, place, and time. She appears well-developed and well-nourished.  HENT:  Head: Normocephalic and atraumatic.  Eyes: Pupils are equal, round, and reactive to light.  Neck: Normal range of motion. Neck supple.  Cardiovascular: Normal rate, regular rhythm and normal heart sounds.   No murmur heard. Pulmonary/Chest: Effort normal and breath sounds normal. No respiratory distress. She has no wheezes.  She has no rales.  Abdominal: Soft. She exhibits no distension. There is tenderness. There is no rebound and no guarding.  Mild right lower quadrant tenderness without rebound guarding or mass.  Musculoskeletal: Normal range of motion.  Neurological: She is alert and oriented to person, place, and time. No cranial nerve deficit.  Skin: Skin is warm and dry.  Psychiatric: She has a normal mood and affect. Her speech is normal.  Nursing note and vitals reviewed.   ED Course  Procedures (including critical care time) Labs  Review Labs Reviewed  COMPREHENSIVE METABOLIC PANEL - Abnormal; Notable for the following:    Glucose, Bld 105 (*)    Calcium 8.7 (*)    Alkaline Phosphatase 130 (*)    All other components within normal limits  LIPASE, BLOOD - Abnormal; Notable for the following:    Lipase 21 (*)    All other components within normal limits  CBC WITH DIFFERENTIAL/PLATELET - Abnormal; Notable for the following:    RBC 5.28 (*)    Hemoglobin 16.3 (*)    HCT 46.6 (*)    All other components within normal limits  URINALYSIS, ROUTINE W REFLEX MICROSCOPIC (NOT AT [facility])  OCCULT BLOOD X 1 CARD TO LAB, STOOL    Imaging Review Ct Abdomen Pelvis W Contrast  08/21/2015   CLINICAL DATA:  Right-sided abdominal pain for the last month.  EXAM: CT ABDOMEN AND PELVIS WITH CONTRAST  TECHNIQUE: Multidetector CT imaging of the abdomen and pelvis was performed using the standard protocol following bolus administration of intravenous contrast.  CONTRAST:  164mL OMNIPAQUE IOHEXOL 300 MG/ML  SOLN  COMPARISON:  Multiple exams, including 08/02/2008  FINDINGS: Body habitus reduces diagnostic sensitivity and specificity.  Lower chest: Mild right middle lobe scarring. Mild scarring in the lingula. Right coronary artery atherosclerotic calcification.  Hepatobiliary: Indistinct 9 mm hypodensity in the right hepatic lobe on image 15 series 2, stable from 06/26/2002, accordingly highly likely to be benign and incidental.  Pancreas: Unremarkable  Spleen: Unremarkable  Adrenals/Urinary Tract: 3 dominant staghorn calculi of the right kidney, including a 1.6 cm right mid kidney calculus, a 1.5 cm right kidney lower pole calculus, and a 2.0 cm collecting system calculus. There is abnormal stranding surrounding the collecting system along with enhancement of the adjacent collecting system wall which is thin and probably from irritation. No definite left-sided calculi. There is fullness of the right collecting system and mild right hydronephrosis but  no significant hydroureter. Mild scarring posterolaterally in the right mid kidney, image 29 series 2.  Stomach/Bowel: The cecum sits down in the pelvis. Normal appendix. Amount of colonic stool normal.  Vascular/Lymphatic: Aortoiliac atherosclerotic vascular disease.  Reproductive: Unremarkable  Other: No supplemental non-categorized findings.  Musculoskeletal: Sclerosis along the iliac sides of both sacroiliac joints. Sclerotic lesion along the left superior endplate of L2, with some central lucency. Remote superior endplate compression at L4. Disc bulge at L3-4.  IMPRESSION: 1. Staghorn calculi in the right kidney. One is in the collecting system and associated with inflammatory findings in the collecting system, along with mild right hydronephrosis. This stone is probably intermittently obstructing the UPJ. There is also some scarring in the right mid kidney. 2.  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 4. Slow enlargement of a sclerotic lesion in the left superior endplate of L2 over the past 13 years, has been negative on prior bone scan workup, likely a slowly enlarging benign bone island.   Electronically Signed   By: Thayer Jew  Janeece Fitting M.D.   On: 08/21/2015 19:18   I have personally reviewed and evaluated these images and lab results as part of my medical decision-making.   EKG Interpretation None      MDM   Final diagnoses:  Abdominal pain     patient with right-sided abdominal pain for weeks. Labs overall reassuring. Does have staghorn calculi in right kidney. This is known to patient. Tenderness appears. More inferior than this but this is a possible cause. Will have follow-up with primary care doctor , with whom I discussed the patient.    Davonna Belling, MD 08/21/15 2012

## 2015-08-21 NOTE — Discharge Instructions (Signed)

## 2015-08-21 NOTE — ED Notes (Signed)
Patient vomited after finished contrast.

## 2015-08-21 NOTE — ED Notes (Signed)
Pt states that she has been having right sided abd pain for the past month.  States that it is usually relieved by a bowel movement.  States that it did not help today and she called her doctor and he sent her here.

## 2015-08-22 LAB — POC OCCULT BLOOD, ED
Fecal Occult Bld: NEGATIVE
Fecal Occult Bld: NEGATIVE

## 2015-11-20 ENCOUNTER — Ambulatory Visit (INDEPENDENT_AMBULATORY_CARE_PROVIDER_SITE_OTHER): Payer: BLUE CROSS/BLUE SHIELD | Admitting: Urology

## 2015-11-20 DIAGNOSIS — N200 Calculus of kidney: Secondary | ICD-10-CM

## 2015-11-20 DIAGNOSIS — R1031 Right lower quadrant pain: Secondary | ICD-10-CM | POA: Diagnosis not present

## 2015-11-21 ENCOUNTER — Other Ambulatory Visit: Payer: Self-pay | Admitting: Urology

## 2015-12-20 NOTE — Patient Instructions (Signed)
YOUR PROCEDURE IS SCHEDULED ON :  12/30/15  REPORT TO [facility] MAIN ENTRANCE FOLLOW SIGNS TO EAST ELEVATOR - GO TO 3rd FLOOR CHECK IN AT 3 EAST NURSES STATION (SHORT STAY) AT:  9:30 AM  CALL THIS NUMBER IF YOU HAVE PROBLEMS THE MORNING OF SURGERY [phone]  REMEMBER:ONLY 1 PER PERSON MAY GO TO SHORT STAY WITH YOU TO GET READY THE MORNING OF YOUR SURGERY  DO NOT EAT FOOD OR DRINK LIQUIDS AFTER MIDNIGHT  TAKE THESE MEDICINES THE MORNING OF SURGERY:  YOU MAY NOT HAVE ANY METAL ON YOUR BODY INCLUDING HAIR PINS AND PIERCING'S. DO NOT WEAR JEWELRY, MAKEUP, LOTIONS, POWDERS OR PERFUMES. DO NOT WEAR NAIL POLISH. DO NOT SHAVE 48 HRS PRIOR TO SURGERY. MEN MAY SHAVE FACE AND NECK.  DO NOT [facility]. [facility] IS NOT RESPONSIBLE FOR VALUABLES.  CONTACTS, DENTURES OR PARTIALS MAY NOT BE WORN TO SURGERY. LEAVE SUITCASE IN CAR. CAN BE BROUGHT TO ROOM AFTER SURGERY.  PATIENTS DISCHARGED THE DAY OF SURGERY WILL NOT BE ALLOWED TO DRIVE HOME.  PLEASE READ OVER THE FOLLOWING INSTRUCTION SHEETS _________________________________________________________________________________                                          [facility] - PREPARING FOR SURGERY  Before surgery, you can play an important role.  Because skin is not sterile, your skin needs to be as free of germs as possible.  You can reduce the number of germs on your skin by washing with CHG (chlorahexidine gluconate) soap before surgery.  CHG is an antiseptic cleaner which kills germs and bonds with the skin to continue killing germs even after washing. Please DO NOT use if you have an allergy to CHG or antibacterial soaps.  If your skin becomes reddened/irritated stop using the CHG and inform your nurse when you arrive at Short Stay. Do not shave (including legs and underarms) for at least 48 hours prior to the first CHG shower.  You may shave your face. Please follow these instructions carefully:   1.   Shower with CHG Soap the night before surgery and the  morning of Surgery.   2.  If you choose to wash your hair, wash your hair first as usual with your  normal  Shampoo.   3.  After you shampoo, rinse your hair and body thoroughly to remove the  shampoo.                                         4.  Use CHG as you would any other liquid soap.  You can apply chg directly  to the skin and wash . Gently wash with scrungie or clean wascloth    5.  Apply the CHG Soap to your body ONLY FROM THE NECK DOWN.   Do not use on open                           Wound or open sores. Avoid contact with eyes, ears mouth and genitals (private parts).                        Genitals (private parts) with your normal soap.  6.  Wash thoroughly, paying special attention to the area where your surgery  will be performed.   7.  Thoroughly rinse your body with warm water from the neck down.   8.  DO NOT shower/wash with your normal soap after using and rinsing off  the CHG Soap .                9.  Pat yourself dry with a clean towel.             10.  Wear clean night clothes to bed after shower             11.  Place clean sheets on your bed the night of your first shower and do not  sleep with pets.  Day of Surgery : Do not apply any lotions/deodorants the morning of surgery.  Please wear clean clothes to the hospital/surgery center.  FAILURE TO FOLLOW THESE INSTRUCTIONS MAY RESULT IN THE CANCELLATION OF YOUR SURGERY    PATIENT SIGNATURE_________________________________  ______________________________________________________________________

## 2015-12-26 ENCOUNTER — Encounter (HOSPITAL_COMMUNITY)
Admission: RE | Admit: 2015-12-26 | Discharge: 2015-12-26 | Disposition: A | Discharge status: Home / Self Care | Payer: BLUE CROSS/BLUE SHIELD | Source: Ambulatory Visit | Attending: Internal Medicine | Admitting: Internal Medicine

## 2015-12-27 ENCOUNTER — Encounter (HOSPITAL_COMMUNITY)
Admission: RE | Admit: 2015-12-27 | Discharge: 2015-12-27 | Disposition: A | Discharge status: Home / Self Care | Payer: BLUE CROSS/BLUE SHIELD | Source: Ambulatory Visit | Attending: Urology | Admitting: Urology

## 2015-12-27 ENCOUNTER — Encounter (HOSPITAL_COMMUNITY): Payer: Self-pay

## 2015-12-27 DIAGNOSIS — M1990 Unspecified osteoarthritis, unspecified site: Secondary | ICD-10-CM | POA: Diagnosis not present

## 2015-12-27 DIAGNOSIS — E119 Type 2 diabetes mellitus without complications: Secondary | ICD-10-CM | POA: Diagnosis not present

## 2015-12-27 DIAGNOSIS — I2510 Atherosclerotic heart disease of native coronary artery without angina pectoris: Secondary | ICD-10-CM | POA: Diagnosis not present

## 2015-12-27 DIAGNOSIS — F17210 Nicotine dependence, cigarettes, uncomplicated: Secondary | ICD-10-CM | POA: Diagnosis not present

## 2015-12-27 DIAGNOSIS — Z955 Presence of coronary angioplasty implant and graft: Secondary | ICD-10-CM | POA: Diagnosis not present

## 2015-12-27 DIAGNOSIS — Z79899 Other long term (current) drug therapy: Secondary | ICD-10-CM | POA: Diagnosis not present

## 2015-12-27 DIAGNOSIS — R109 Unspecified abdominal pain: Secondary | ICD-10-CM | POA: Diagnosis present

## 2015-12-27 DIAGNOSIS — E785 Hyperlipidemia, unspecified: Secondary | ICD-10-CM | POA: Diagnosis not present

## 2015-12-27 DIAGNOSIS — Z6832 Body mass index (BMI) 32.0-32.9, adult: Secondary | ICD-10-CM | POA: Diagnosis not present

## 2015-12-27 DIAGNOSIS — N200 Calculus of kidney: Secondary | ICD-10-CM | POA: Diagnosis not present

## 2015-12-27 DIAGNOSIS — I252 Old myocardial infarction: Secondary | ICD-10-CM | POA: Diagnosis not present

## 2015-12-27 DIAGNOSIS — Z791 Long term (current) use of non-steroidal anti-inflammatories (NSAID): Secondary | ICD-10-CM | POA: Diagnosis not present

## 2015-12-27 DIAGNOSIS — I10 Essential (primary) hypertension: Secondary | ICD-10-CM | POA: Diagnosis not present

## 2015-12-27 DIAGNOSIS — E669 Obesity, unspecified: Secondary | ICD-10-CM | POA: Diagnosis not present

## 2015-12-27 DIAGNOSIS — Z7982 Long term (current) use of aspirin: Secondary | ICD-10-CM | POA: Diagnosis not present

## 2015-12-27 HISTORY — DX: Personal history of other infectious and parasitic diseases: Z86.19

## 2015-12-27 HISTORY — DX: Hyperlipidemia, unspecified: E78.5

## 2015-12-27 HISTORY — DX: Prediabetes: R73.03

## 2015-12-27 HISTORY — DX: Personal history of urinary calculi: Z87.442

## 2015-12-27 HISTORY — DX: Acute myocardial infarction, unspecified: I21.9

## 2015-12-27 HISTORY — DX: Unspecified osteoarthritis, unspecified site: M19.90

## 2015-12-27 LAB — CBC
HEMATOCRIT: 47.4 % — AB (ref 36.0–46.0)
Hemoglobin: 16.3 g/dL — ABNORMAL HIGH (ref 12.0–15.0)
MCH: 30.1 pg (ref 26.0–34.0)
MCHC: 34.4 g/dL (ref 30.0–36.0)
MCV: 87.6 fL (ref 78.0–100.0)
PLATELETS: 222 10*3/uL (ref 150–400)
RBC: 5.41 MIL/uL — AB (ref 3.87–5.11)
RDW: 13.8 % (ref 11.5–15.5)
WBC: 7.9 10*3/uL (ref 4.0–10.5)

## 2015-12-27 LAB — BASIC METABOLIC PANEL
Anion gap: 9 (ref 5–15)
BUN: 9 mg/dL (ref 6–20)
CHLORIDE: 104 mmol/L (ref 101–111)
CO2: 28 mmol/L (ref 22–32)
CREATININE: 0.88 mg/dL (ref 0.44–1.00)
Calcium: 9.7 mg/dL (ref 8.9–10.3)
GFR calc non Af Amer: >60 mL/min (ref >60)
Glucose, Bld: 121 mg/dL — ABNORMAL HIGH (ref 65–99)
POTASSIUM: 4.9 mmol/L (ref 3.5–5.1)
Sodium: 141 mmol/L (ref 135–145)

## 2015-12-27 NOTE — Patient Instructions (Signed)
YOUR PROCEDURE IS SCHEDULED ON : 12/30/15  REPORT TO [facility] MAIN ENTRANCE FOLLOW SIGNS TO EAST ELEVATOR - GO TO 3rd FLOOR CHECK IN AT 3 EAST NURSES STATION (SHORT STAY) AT: 9:30 AM  CALL THIS NUMBER IF YOU HAVE PROBLEMS THE MORNING OF SURGERY [phone]  REMEMBER:ONLY 1 PER PERSON MAY GO TO SHORT STAY WITH YOU TO GET READY THE MORNING OF YOUR SURGERY  DO NOT EAT FOOD OR DRINK LIQUIDS AFTER MIDNIGHT  TAKE THESE MEDICINES THE MORNING OF SURGERY:  ATORVASTATIN/ AMLODIPINE / METOPROLOL   YOU MAY NOT HAVE ANY METAL ON YOUR BODY INCLUDING HAIR PINS AND PIERCING'S. DO NOT WEAR JEWELRY, MAKEUP, LOTIONS, POWDERS OR PERFUMES. DO NOT WEAR NAIL POLISH. DO NOT SHAVE 48 HRS PRIOR TO SURGERY. MEN MAY SHAVE FACE AND NECK.  DO NOT [place]. [facility] IS NOT RESPONSIBLE FOR VALUABLES.  CONTACTS, DENTURES OR PARTIALS MAY NOT BE WORN TO SURGERY. LEAVE SUITCASE IN CAR. CAN BE BROUGHT TO ROOM AFTER SURGERY.  PATIENTS DISCHARGED THE DAY OF SURGERY WILL NOT BE ALLOWED TO DRIVE HOME.  PLEASE READ OVER THE FOLLOWING INSTRUCTION SHEETS _________________________________________________________________________________                                           - PREPARING FOR SURGERY  Before surgery, you can play an important role.  Because skin is not sterile, your skin needs to be as free of germs as possible.  You can reduce the number of germs on your skin by washing with CHG (chlorahexidine gluconate) soap before surgery.  CHG is an antiseptic cleaner which kills germs and bonds with the skin to continue killing germs even after washing. Please DO NOT use if you have an allergy to CHG or antibacterial soaps.  If your skin becomes reddened/irritated stop using the CHG and inform your nurse when you arrive at Short Stay. Do not shave (including legs and underarms) for at least 48 hours prior to the first CHG shower.  You may shave your face. Please  follow these instructions carefully:   1.  Shower with CHG Soap the night before surgery and the  morning of Surgery.   2.  If you choose to wash your hair, wash your hair first as usual with your  normal  Shampoo.   3.  After you shampoo, rinse your hair and body thoroughly to remove the  shampoo.                                         4.  Use CHG as you would any other liquid soap.  You can apply chg directly  to the skin and wash . Gently wash with scrungie or clean wascloth    5.  Apply the CHG Soap to your body ONLY FROM THE NECK DOWN.   Do not use on open                           Wound or open sores. Avoid contact with eyes, ears mouth and genitals (private parts).                        Genitals (private parts) with your normal  soap.              6.  Wash thoroughly, paying special attention to the area where your surgery  will be performed.   7.  Thoroughly rinse your body with warm water from the neck down.   8.  DO NOT shower/wash with your normal soap after using and rinsing off  the CHG Soap .                9.  Pat yourself dry with a clean towel.             10.  Wear clean night clothes to bed after shower             11.  Place clean sheets on your bed the night of your first shower and do not  sleep with pets.  Day of Surgery : Do not apply any lotions/deodorants the morning of surgery.  Please wear clean clothes to the hospital/surgery center.  FAILURE TO FOLLOW THESE INSTRUCTIONS MAY RESULT IN THE CANCELLATION OF YOUR SURGERY    PATIENT SIGNATURE_________________________________  ______________________________________________________________________

## 2015-12-30 ENCOUNTER — Ambulatory Visit (HOSPITAL_COMMUNITY): Payer: BLUE CROSS/BLUE SHIELD

## 2015-12-30 ENCOUNTER — Encounter (HOSPITAL_COMMUNITY): Payer: Self-pay | Admitting: Certified Registered Nurse Anesthetist

## 2015-12-30 ENCOUNTER — Encounter (HOSPITAL_COMMUNITY)
Admission: RE | Disposition: A | Discharge status: Home / Self Care | Payer: Self-pay | Source: Ambulatory Visit | Attending: Urology

## 2015-12-30 ENCOUNTER — Ambulatory Visit (HOSPITAL_COMMUNITY): Payer: BLUE CROSS/BLUE SHIELD | Admitting: Certified Registered Nurse Anesthetist

## 2015-12-30 ENCOUNTER — Observation Stay (HOSPITAL_COMMUNITY)
Admission: RE | Admit: 2015-12-30 | Discharge: 2015-12-31 | Disposition: A | Discharge status: Home / Self Care | Payer: BLUE CROSS/BLUE SHIELD | Source: Ambulatory Visit | Attending: Urology | Admitting: Urology

## 2015-12-30 DIAGNOSIS — I2510 Atherosclerotic heart disease of native coronary artery without angina pectoris: Secondary | ICD-10-CM | POA: Insufficient documentation

## 2015-12-30 DIAGNOSIS — M1990 Unspecified osteoarthritis, unspecified site: Secondary | ICD-10-CM | POA: Insufficient documentation

## 2015-12-30 DIAGNOSIS — E669 Obesity, unspecified: Secondary | ICD-10-CM | POA: Insufficient documentation

## 2015-12-30 DIAGNOSIS — E785 Hyperlipidemia, unspecified: Secondary | ICD-10-CM | POA: Insufficient documentation

## 2015-12-30 DIAGNOSIS — F17210 Nicotine dependence, cigarettes, uncomplicated: Secondary | ICD-10-CM | POA: Insufficient documentation

## 2015-12-30 DIAGNOSIS — I10 Essential (primary) hypertension: Secondary | ICD-10-CM | POA: Insufficient documentation

## 2015-12-30 DIAGNOSIS — Z6832 Body mass index (BMI) 32.0-32.9, adult: Secondary | ICD-10-CM | POA: Insufficient documentation

## 2015-12-30 DIAGNOSIS — Z79899 Other long term (current) drug therapy: Secondary | ICD-10-CM | POA: Insufficient documentation

## 2015-12-30 DIAGNOSIS — E119 Type 2 diabetes mellitus without complications: Secondary | ICD-10-CM | POA: Insufficient documentation

## 2015-12-30 DIAGNOSIS — Z7982 Long term (current) use of aspirin: Secondary | ICD-10-CM | POA: Insufficient documentation

## 2015-12-30 DIAGNOSIS — I252 Old myocardial infarction: Secondary | ICD-10-CM | POA: Insufficient documentation

## 2015-12-30 DIAGNOSIS — N200 Calculus of kidney: Principal | ICD-10-CM | POA: Insufficient documentation

## 2015-12-30 DIAGNOSIS — Z955 Presence of coronary angioplasty implant and graft: Secondary | ICD-10-CM | POA: Insufficient documentation

## 2015-12-30 DIAGNOSIS — Z791 Long term (current) use of non-steroidal anti-inflammatories (NSAID): Secondary | ICD-10-CM | POA: Insufficient documentation

## 2015-12-30 HISTORY — DX: Type 2 diabetes mellitus without complications: E11.9

## 2015-12-30 HISTORY — PX: CYSTOSCOPY WITH STENT PLACEMENT: SHX5790

## 2015-12-30 HISTORY — PX: NEPHROLITHOTOMY: SHX5134

## 2015-12-30 LAB — CBC
HEMATOCRIT: 41.4 % (ref 36.0–46.0)
HEMOGLOBIN: 13.4 g/dL (ref 12.0–15.0)
MCH: 29.1 pg (ref 26.0–34.0)
MCHC: 32.4 g/dL (ref 30.0–36.0)
MCV: 90 fL (ref 78.0–100.0)
Platelets: 157 10*3/uL (ref 150–400)
RBC: 4.6 MIL/uL (ref 3.87–5.11)
RDW: 14.1 % (ref 11.5–15.5)
WBC: 7.6 10*3/uL (ref 4.0–10.5)

## 2015-12-30 LAB — GLUCOSE, CAPILLARY
Glucose-Capillary: 106 mg/dL — ABNORMAL HIGH (ref 65–99)
Glucose-Capillary: 130 mg/dL — ABNORMAL HIGH (ref 65–99)

## 2015-12-30 LAB — BASIC METABOLIC PANEL
ANION GAP: 8 (ref 5–15)
BUN: 13 mg/dL (ref 6–20)
CALCIUM: 8.6 mg/dL — AB (ref 8.9–10.3)
CHLORIDE: 106 mmol/L (ref 101–111)
CO2: 25 mmol/L (ref 22–32)
Creatinine, Ser: 1.09 mg/dL — ABNORMAL HIGH (ref 0.44–1.00)
GFR calc non Af Amer: 54 mL/min — ABNORMAL LOW (ref >60)
Glucose, Bld: 133 mg/dL — ABNORMAL HIGH (ref 65–99)
POTASSIUM: 4 mmol/L (ref 3.5–5.1)
Sodium: 139 mmol/L (ref 135–145)

## 2015-12-30 SURGERY — NEPHROLITHOTOMY PERCUTANEOUS
Anesthesia: General | Laterality: Right

## 2015-12-30 MED ORDER — HYDROMORPHONE HCL 1 MG/ML IJ SOLN
INTRAMUSCULAR | Status: AC
  Filled 2015-12-30: qty 1

## 2015-12-30 MED ORDER — ROCURONIUM BROMIDE 100 MG/10ML IV SOLN
INTRAVENOUS | Status: AC
  Filled 2015-12-30: qty 1

## 2015-12-30 MED ORDER — FENTANYL CITRATE (PF) 100 MCG/2ML IJ SOLN
INTRAMUSCULAR | Status: DC | PRN
  Administered 2015-12-30 (×2): 50 ug via INTRAVENOUS

## 2015-12-30 MED ORDER — CEFAZOLIN SODIUM-DEXTROSE 2-3 GM-% IV SOLR
2.0000 g | INTRAVENOUS | Status: AC
  Administered 2015-12-30: 2 g via INTRAVENOUS

## 2015-12-30 MED ORDER — ONDANSETRON HCL 4 MG/2ML IJ SOLN: 4.0000 mg | INTRAMUSCULAR | Status: DC | PRN

## 2015-12-30 MED ORDER — ACETAMINOPHEN 325 MG PO TABS: 650.0000 mg | ORAL_TABLET | ORAL | Status: DC | PRN

## 2015-12-30 MED ORDER — CEFAZOLIN SODIUM-DEXTROSE 2-3 GM-% IV SOLR
INTRAVENOUS | Status: AC
  Filled 2015-12-30: qty 50

## 2015-12-30 MED ORDER — IOHEXOL 300 MG/ML  SOLN
INTRAMUSCULAR | Status: DC | PRN
  Administered 2015-12-30: 50 mL

## 2015-12-30 MED ORDER — SUCCINYLCHOLINE CHLORIDE 20 MG/ML IJ SOLN
INTRAMUSCULAR | Status: DC | PRN
  Administered 2015-12-30: 100 mg via INTRAVENOUS

## 2015-12-30 MED ORDER — PHENYLEPHRINE HCL 10 MG/ML IJ SOLN
INTRAMUSCULAR | Status: AC
  Filled 2015-12-30: qty 1

## 2015-12-30 MED ORDER — SODIUM CHLORIDE 0.9 % IJ SOLN
INTRAMUSCULAR | Status: AC
  Filled 2015-12-30: qty 10

## 2015-12-30 MED ORDER — LIDOCAINE HCL (CARDIAC) 20 MG/ML IV SOLN
INTRAVENOUS | Status: DC | PRN
  Administered 2015-12-30: 100 mg via INTRAVENOUS

## 2015-12-30 MED ORDER — HYDROMORPHONE HCL 1 MG/ML IJ SOLN
0.2500 mg | INTRAMUSCULAR | Status: DC | PRN
  Administered 2015-12-30: 0.5 mg via INTRAVENOUS
  Administered 2015-12-30 (×2): 0.25 mg via INTRAVENOUS
  Administered 2015-12-30: 0.5 mg via INTRAVENOUS

## 2015-12-30 MED ORDER — PHENYLEPHRINE HCL 10 MG/ML IJ SOLN: 10.0000 mg | INTRAMUSCULAR | Status: DC | PRN

## 2015-12-30 MED ORDER — STERILE WATER FOR IRRIGATION IR SOLN
Status: DC | PRN
  Administered 2015-12-30: 3000 mL

## 2015-12-30 MED ORDER — ONDANSETRON HCL 4 MG/2ML IJ SOLN
INTRAMUSCULAR | Status: AC
  Filled 2015-12-30: qty 2

## 2015-12-30 MED ORDER — DIPHENHYDRAMINE HCL 50 MG/ML IJ SOLN: 12.5000 mg | Freq: Four times a day (QID) | INTRAMUSCULAR | Status: DC | PRN

## 2015-12-30 MED ORDER — ATORVASTATIN CALCIUM 40 MG PO TABS
80.0000 mg | ORAL_TABLET | Freq: Every morning | ORAL | Status: DC
  Administered 2015-12-31: 80 mg via ORAL
  Filled 2015-12-30: qty 2

## 2015-12-30 MED ORDER — FENTANYL CITRATE (PF) 250 MCG/5ML IJ SOLN
INTRAMUSCULAR | Status: AC
Start: 2015-12-30 — End: 2015-12-30
  Filled 2015-12-30: qty 5

## 2015-12-30 MED ORDER — PROPOFOL 10 MG/ML IV BOLUS
INTRAVENOUS | Status: DC | PRN
  Administered 2015-12-30: 130 mg via INTRAVENOUS

## 2015-12-30 MED ORDER — METOCLOPRAMIDE HCL 5 MG/ML IJ SOLN: 10.0000 mg | Freq: Once | INTRAMUSCULAR | Status: DC | PRN

## 2015-12-30 MED ORDER — MEPERIDINE HCL 50 MG/ML IJ SOLN: 6.2500 mg | INTRAMUSCULAR | Status: DC | PRN

## 2015-12-30 MED ORDER — BELLADONNA ALKALOIDS-OPIUM 16.2-60 MG RE SUPP: 1.0000 | Freq: Four times a day (QID) | RECTAL | Status: DC | PRN

## 2015-12-30 MED ORDER — AMLODIPINE BESYLATE 10 MG PO TABS
10.0000 mg | ORAL_TABLET | Freq: Every day | ORAL | Status: DC
  Administered 2015-12-31: 10 mg via ORAL
  Filled 2015-12-30: qty 1

## 2015-12-30 MED ORDER — MIDAZOLAM HCL 2 MG/2ML IJ SOLN
INTRAMUSCULAR | Status: AC
  Filled 2015-12-30: qty 2

## 2015-12-30 MED ORDER — METOPROLOL SUCCINATE ER 50 MG PO TB24
50.0000 mg | ORAL_TABLET | Freq: Every day | ORAL | Status: DC
  Administered 2015-12-31: 50 mg via ORAL
  Filled 2015-12-30: qty 1

## 2015-12-30 MED ORDER — EPHEDRINE SULFATE 50 MG/ML IJ SOLN
INTRAMUSCULAR | Status: DC | PRN
  Administered 2015-12-30: 5 mg via INTRAVENOUS
  Administered 2015-12-30 (×2): 10 mg via INTRAVENOUS

## 2015-12-30 MED ORDER — PHENYLEPHRINE HCL 10 MG/ML IJ SOLN
INTRAMUSCULAR | Status: DC | PRN
  Administered 2015-12-30: 80 ug via INTRAVENOUS
  Administered 2015-12-30: 40 ug via INTRAVENOUS
  Administered 2015-12-30: 80 ug via INTRAVENOUS

## 2015-12-30 MED ORDER — FENTANYL CITRATE (PF) 250 MCG/5ML IJ SOLN
INTRAMUSCULAR | Status: AC
  Filled 2015-12-30: qty 5

## 2015-12-30 MED ORDER — SUGAMMADEX SODIUM 200 MG/2ML IV SOLN
INTRAVENOUS | Status: AC
  Filled 2015-12-30: qty 2

## 2015-12-30 MED ORDER — ZOLPIDEM TARTRATE 5 MG PO TABS: 5.0000 mg | ORAL_TABLET | Freq: Every evening | ORAL | Status: DC | PRN

## 2015-12-30 MED ORDER — OXYCODONE-ACETAMINOPHEN 5-325 MG PO TABS: 1.0000 | ORAL_TABLET | ORAL | Status: DC | PRN

## 2015-12-30 MED ORDER — LACTATED RINGERS IV SOLN
INTRAVENOUS | Status: DC
  Administered 2015-12-30: 13:00:00 via INTRAVENOUS
  Administered 2015-12-30: 1000 mL via INTRAVENOUS

## 2015-12-30 MED ORDER — SODIUM CHLORIDE 0.9 % IV SOLN
INTRAVENOUS | Status: DC
  Administered 2015-12-30 – 2015-12-31 (×2): via INTRAVENOUS

## 2015-12-30 MED ORDER — DEXAMETHASONE SODIUM PHOSPHATE 10 MG/ML IJ SOLN
INTRAMUSCULAR | Status: AC
  Filled 2015-12-30: qty 1

## 2015-12-30 MED ORDER — DEXAMETHASONE SODIUM PHOSPHATE 10 MG/ML IJ SOLN
INTRAMUSCULAR | Status: DC | PRN
  Administered 2015-12-30: 10 mg via INTRAVENOUS

## 2015-12-30 MED ORDER — ASPIRIN EC 81 MG PO TBEC
81.0000 mg | DELAYED_RELEASE_TABLET | Freq: Every morning | ORAL | Status: DC
Start: 2015-12-31 — End: 2015-12-31
  Administered 2015-12-31: 81 mg via ORAL
  Filled 2015-12-30: qty 1

## 2015-12-30 MED ORDER — SUCCINYLCHOLINE 20MG/ML (10ML) SYRINGE FOR MEDFUSION PUMP - OPTIME: INTRAMUSCULAR | Status: DC | PRN

## 2015-12-30 MED ORDER — HYDROMORPHONE HCL 1 MG/ML IJ SOLN: 0.5000 mg | INTRAMUSCULAR | Status: DC | PRN

## 2015-12-30 MED ORDER — ROCURONIUM BROMIDE 100 MG/10ML IV SOLN
INTRAVENOUS | Status: DC | PRN
  Administered 2015-12-30: 35 mg via INTRAVENOUS

## 2015-12-30 MED ORDER — CEFAZOLIN SODIUM 1-5 GM-% IV SOLN
1.0000 g | Freq: Three times a day (TID) | INTRAVENOUS | Status: AC
  Administered 2015-12-30 – 2015-12-31 (×2): 1 g via INTRAVENOUS
  Filled 2015-12-30 (×2): qty 50

## 2015-12-30 MED ORDER — ONDANSETRON HCL 4 MG/2ML IJ SOLN
INTRAMUSCULAR | Status: DC | PRN
  Administered 2015-12-30: 4 mg via INTRAVENOUS

## 2015-12-30 MED ORDER — DIPHENHYDRAMINE HCL 12.5 MG/5ML PO ELIX: 12.5000 mg | ORAL_SOLUTION | Freq: Four times a day (QID) | ORAL | Status: DC | PRN

## 2015-12-30 MED ORDER — MIDAZOLAM HCL 5 MG/5ML IJ SOLN
INTRAMUSCULAR | Status: DC | PRN
  Administered 2015-12-30: 2 mg via INTRAVENOUS

## 2015-12-30 MED ORDER — EPHEDRINE SULFATE 50 MG/ML IJ SOLN
INTRAMUSCULAR | Status: AC
  Filled 2015-12-30: qty 1

## 2015-12-30 MED ORDER — LIDOCAINE HCL (CARDIAC) 20 MG/ML IV SOLN
INTRAVENOUS | Status: AC
  Filled 2015-12-30: qty 5

## 2015-12-30 MED ORDER — PROPOFOL 10 MG/ML IV BOLUS
INTRAVENOUS | Status: AC
  Filled 2015-12-30: qty 20

## 2015-12-30 MED ORDER — SODIUM CHLORIDE 0.9 % IR SOLN
Status: DC | PRN
  Administered 2015-12-30: 9000 mL via INTRAVESICAL

## 2015-12-30 MED ORDER — ARTIFICIAL TEARS OP OINT
TOPICAL_OINTMENT | OPHTHALMIC | Status: AC
  Filled 2015-12-30: qty 3.5

## 2015-12-30 MED ORDER — PHENYLEPHRINE 40 MCG/ML (10ML) SYRINGE FOR IV PUSH (FOR BLOOD PRESSURE SUPPORT)
PREFILLED_SYRINGE | INTRAVENOUS | Status: AC
  Filled 2015-12-30: qty 10

## 2015-12-30 MED ORDER — PHENYLEPHRINE HCL 10 MG/ML IJ SOLN
10.0000 mg | INTRAVENOUS | Status: DC | PRN
  Administered 2015-12-30: 25 ug/min via INTRAVENOUS

## 2015-12-30 SURGICAL SUPPLY — 66 items
APL SKNCLS STERI-STRIP NONHPOA (GAUZE/BANDAGES/DRESSINGS) ×2
BAG URINE DRAINAGE (UROLOGICAL SUPPLIES) ×4 IMPLANT
BASKET STONE NITINOL 3FRX115MB (UROLOGICAL SUPPLIES) IMPLANT
BASKET ZERO TIP NITINOL 2.4FR (BASKET) IMPLANT
BENZOIN TINCTURE PRP APPL 2/3 (GAUZE/BANDAGES/DRESSINGS) ×4 IMPLANT
BLADE SURG 15 STRL LF DISP TIS (BLADE) ×1 IMPLANT
BLADE SURG 15 STRL SS (BLADE) ×2
BSKT STON RTRVL ZERO TP 2.4FR (BASKET)
CATCHER STONE W/TUBE ADAPTER (UROLOGICAL SUPPLIES) IMPLANT
CATH FOLEY 2W COUNCIL 20FR 5CC (CATHETERS) ×1 IMPLANT
CATH FOLEY 2W COUNCIL 5CC 18FR (CATHETERS) ×1 IMPLANT
CATH FOLEY 2WAY SLVR  5CC 16FR (CATHETERS) ×1
CATH FOLEY 2WAY SLVR  5CC 18FR (CATHETERS)
CATH FOLEY 2WAY SLVR 5CC 16FR (CATHETERS) ×1 IMPLANT
CATH FOLEY 2WAY SLVR 5CC 18FR (CATHETERS) ×1 IMPLANT
CATH IMAGER II 65CM (CATHETERS) ×1 IMPLANT
CATH INTERMIT  6FR 70CM (CATHETERS) ×2 IMPLANT
CATH ROBINSON RED A/P 20FR (CATHETERS) IMPLANT
CATH X-FORCE N30 NEPHROSTOMY (TUBING) ×3 IMPLANT
COVER SURGICAL LIGHT HANDLE (MISCELLANEOUS) ×1 IMPLANT
DRAPE C-ARM 42X120 X-RAY (DRAPES) ×2 IMPLANT
DRAPE LINGEMAN PERC (DRAPES) ×2 IMPLANT
DRAPE SHEET LG 3/4 BI-LAMINATE (DRAPES) ×2 IMPLANT
DRAPE SURG IRRIG POUCH 19X23 (DRAPES) ×2 IMPLANT
DRSG PAD ABDOMINAL 8X10 ST (GAUZE/BANDAGES/DRESSINGS) ×4 IMPLANT
DRSG TEGADERM 8X12 (GAUZE/BANDAGES/DRESSINGS) ×4 IMPLANT
FIBER LASER FLEXIVA 1000 (UROLOGICAL SUPPLIES) IMPLANT
FIBER LASER FLEXIVA 200 (UROLOGICAL SUPPLIES) IMPLANT
FIBER LASER FLEXIVA 365 (UROLOGICAL SUPPLIES) IMPLANT
FIBER LASER FLEXIVA 550 (UROLOGICAL SUPPLIES) IMPLANT
FIBER LASER TRAC TIP (UROLOGICAL SUPPLIES) IMPLANT
GAUZE SPONGE 4X4 12PLY STRL (GAUZE/BANDAGES/DRESSINGS) ×2 IMPLANT
GLOVE BIO SURGEON STRL SZ8 (GLOVE) ×5 IMPLANT
GLOVE BIOGEL PI IND STRL 8 (GLOVE) IMPLANT
GLOVE BIOGEL PI INDICATOR 8 (GLOVE)
GOWN STRL REUS W/TWL LRG LVL3 (GOWN DISPOSABLE) ×4 IMPLANT
GOWN STRL REUS W/TWL XL LVL3 (GOWN DISPOSABLE) ×2 IMPLANT
GUIDEWIRE AMPLAZ .035X145 (WIRE) ×2 IMPLANT
GUIDEWIRE ANG ZIPWIRE 038X150 (WIRE) ×1 IMPLANT
GUIDEWIRE STR DUAL SENSOR (WIRE) ×4 IMPLANT
INSERT SILICONE FOR G14464 (MISCELLANEOUS) ×1 IMPLANT
KIT BASIN OR (CUSTOM PROCEDURE TRAY) ×2 IMPLANT
MANIFOLD NEPTUNE II (INSTRUMENTS) ×2 IMPLANT
NDL TROCAR 18X15 ECHO (NEEDLE) IMPLANT
NDL TROCAR 18X20 (NEEDLE) IMPLANT
NEEDLE TROCAR 18X15 ECHO (NEEDLE) IMPLANT
NEEDLE TROCAR 18X20 (NEEDLE) IMPLANT
NS IRRIG 1000ML POUR BTL (IV SOLUTION) ×2 IMPLANT
PACK CYSTO (CUSTOM PROCEDURE TRAY) ×2 IMPLANT
PROBE LITHOCLAST ULTRA 3.8X403 (UROLOGICAL SUPPLIES) IMPLANT
PROBE PNEUMATIC 1.0MMX570MM (UROLOGICAL SUPPLIES) IMPLANT
SET AMPLATZ RENAL DILATOR (MISCELLANEOUS) ×1 IMPLANT
SET IRRIG Y TYPE TUR BLADDER L (SET/KITS/TRAYS/PACK) ×4 IMPLANT
SHEATH PEELAWAY SET 9 (SHEATH) ×1 IMPLANT
SPONGE LAP 4X18 X RAY DECT (DISPOSABLE) ×2 IMPLANT
STENT CONTOUR 6FRX26X.038 (STENTS) IMPLANT
STONE CATCHER W/TUBE ADAPTER (UROLOGICAL SUPPLIES) IMPLANT
SUT SILK 2 0 30  PSL (SUTURE) ×1
SUT SILK 2 0 30 PSL (SUTURE) ×1 IMPLANT
SYR 20CC LL (SYRINGE) ×4 IMPLANT
SYRINGE 10CC LL (SYRINGE) ×2 IMPLANT
SYRINGE 60CC LL (MISCELLANEOUS) ×1 IMPLANT
TOWEL OR 17X26 10 PK STRL BLUE (TOWEL DISPOSABLE) ×2 IMPLANT
TUBING CONNECTING 10 (TUBING) ×4 IMPLANT
TUBING TUR DISP (UROLOGICAL SUPPLIES) ×1 IMPLANT
WATER STERILE IRR 1500ML POUR (IV SOLUTION) ×1 IMPLANT

## 2015-12-30 NOTE — Progress Notes (Signed)
CBC and B Met results noted.

## 2015-12-30 NOTE — Progress Notes (Signed)
CBC and B MET drawn by lab.

## 2015-12-30 NOTE — Anesthesia Postprocedure Evaluation (Signed)
Anesthesia Post Note  Patient: Leah [name]  Procedure(s) Performed: Procedure(s) (LRB): RIght Nephrostomy with access (Right) CYSTOSCOPY WITH STENT PLACEMENT (Right)  Patient location during evaluation: PACU Anesthesia Type: General Level of consciousness: awake and alert and oriented Pain management: pain level controlled Vital Signs Assessment: post-procedure vital signs reviewed and stable Respiratory status: nonlabored ventilation, respiratory function stable and spontaneous breathing Cardiovascular status: blood pressure returned to baseline and stable Postop Assessment: no signs of nausea or vomiting Anesthetic complications: no    Last Vitals:  Filed Vitals:   12/30/15 1545 12/30/15 1600  BP: 127/77 133/69  Pulse: 65 67  Temp:    Resp: 12 12    Last Pain:  Filed Vitals:   12/30/15 1601  PainSc: 4                  Brach Birdsall A.

## 2015-12-30 NOTE — Anesthesia Procedure Notes (Signed)
Procedure Name: Intubation Date/Time: 12/30/2015 11:58 AM Performed by: Maxwell Caul Pre-anesthesia Checklist: Patient identified, Emergency Drugs available, Suction available and Patient being monitored Patient Re-evaluated:Patient Re-evaluated prior to inductionOxygen Delivery Method: Circle System Utilized Preoxygenation: Pre-oxygenation with 100% oxygen Intubation Type: IV induction Ventilation: Mask ventilation without difficulty Laryngoscope Size: Mac and 4 Grade View: Grade I Tube type: Oral Tube size: 7.5 mm Number of attempts: 1 Airway Equipment and Method: Stylet Placement Confirmation: ETT inserted through vocal cords under direct vision,  positive ETCO2 and breath sounds checked- equal and bilateral Secured at: 21 cm Tube secured with: Tape Dental Injury: Teeth and Oropharynx as per pre-operative assessment

## 2015-12-30 NOTE — H&P (Signed)
Urology Admission H&P  Chief Complaint: right flank pain  History of Present Illness: Leah [name] is a 60yo with a hx of right abdominal pain who underwent CT scan and was found to have a large right renal pelvis stone burden. Currently she has dull intermittent nonradiating right abdominal pain. She denies LUTS. No gross hematuria  Past Medical History  Diagnosis Date  . Hypertension   . Myocardial infarction ([place]) 1996     HAD ANGIOPLASTY WITH 2 STENTS  . Hyperlipidemia   . Arthritis   . History of shingles   . History of kidney stones   . Borderline diabetes   . Diabetes mellitus without complication ([place])     no meds   Past Surgical History  Procedure Laterality Date  . Breast lumpectomy  >10 YRS AGO    RT BREAST  . Coronary angioplasty  1996    ANGIOPLASTY / 2 STENTS  . Lithotripsy      Home Medications:  Prescriptions prior to admission  Medication Sig Dispense Refill Last Dose  . amLODipine (NORVASC) 10 MG tablet Take 10 mg by mouth daily.   12/30/2015 at 0730  . aspirin EC 81 MG tablet Take 81 mg by mouth every morning.    12/27/2015  . atorvastatin (LIPITOR) 80 MG tablet Take 80 mg by mouth every morning.   12/30/2015 at 0730  . Cholecalciferol (VITAMIN D-3) 1000 UNITS CAPS Take 1,000 Units by mouth 2 (two) times daily.    2 wk  . folic acid (FOLVITE) Q000111Q MCG tablet Take 1 mcg by mouth every morning.   2 wk  . ibuprofen (ADVIL) 200 MG tablet Take 200 mg by mouth every 6 (six) hours as needed for headache or moderate pain.   12/28/2015  . lisinopril (PRINIVIL,ZESTRIL) 10 MG tablet Take 10 mg by mouth every morning.   12/30/2015 at 0730  . metoprolol succinate (TOPROL-XL) 50 MG 24 hr tablet Take 50 mg by mouth daily. Take with or immediately following a meal.   12/30/2015 at 0730  . Multiple Vitamin (MULTIVITAMIN WITH MINERALS) TABS tablet Take 1 tablet by mouth every morning.   2 wk  . Omega-3 Fatty Acids (OMEGA 3 500 PO) Take 500 mg by mouth every morning.   2 wk  . Selenium  200 MCG TABS Take 200 mcg by mouth every morning.   2 wk   Allergies:  Allergies  Allergen Reactions  . Codeine     REACTION: " Makes me high and balcked out in the 1980s"    Family History  Problem Relation Age of Onset  . Heart disease Mother   . Diabetes Mother   . Cancer Father     bone  . Stroke Maternal Grandmother    Social History:  reports that she has been smoking Cigarettes.  She has been smoking about 1.00 pack per day. She has never used smokeless tobacco. She reports that she does not drink alcohol or use illicit drugs.  Review of Systems  Genitourinary: Positive for flank pain.  All other systems reviewed and are negative.   Physical Exam:  Vital signs in last 24 hours: Temp:  [98.3 F (36.8 C)] 98.3 F (36.8 C) (02/13 0909) Pulse Rate:  [84] 84 (02/13 0909) Resp:  [18] 18 (02/13 0909) BP: (152)/(75) 152/75 mmHg (02/13 0909) SpO2:  [100 %] 100 % (02/13 0909) Weight:  [82.555 kg (182 lb)] 82.555 kg (182 lb) (02/13 0941) Physical Exam  Constitutional: She is oriented to person, place, and  time. She appears well-developed and well-nourished.  HENT:  Head: Normocephalic and atraumatic.  Eyes: EOM are normal. Pupils are equal, round, and reactive to light.  Neck: Normal range of motion. No thyromegaly present.  Cardiovascular: Normal rate and regular rhythm.   Respiratory: Effort normal. No respiratory distress.  GI: Soft. She exhibits no distension and no mass. There is no tenderness. There is no rebound and no guarding.  Musculoskeletal: Normal range of motion.  Neurological: She is alert and oriented to person, place, and time.  Skin: Skin is warm and dry.  Psychiatric: She has a normal mood and affect. Her behavior is normal. Judgment and thought content normal.    Laboratory Data:  Results for orders placed or performed during the hospital encounter of 12/30/15 (from the past 24 hour(s))  Glucose, capillary     Status: Abnormal   Collection Time:  12/30/15  9:42 AM  Result Value Ref Range   Glucose-Capillary 106 (H) 65 - 99 mg/dL   No results found for this or any previous visit (from the past 240 hour(s)). Creatinine:  Recent Labs  12/27/15 1150  CREATININE 0.88   Baseline Creatinine: 0.9  Impression/Assessment:  59yo with right renal calculi  Plan:  The risks/benefits/alternatives to R PCNL was explained to the patient and she understands and wishes to proceed with surgery  Emileo Semel L 12/30/2015, 11:44 AM

## 2015-12-30 NOTE — Transfer of Care (Signed)
Immediate Anesthesia Transfer of Care Note  Patient: Leah [name]  Procedure(s) Performed: Procedure(s): RIght Nephrostomy with access (Right) CYSTOSCOPY WITH STENT PLACEMENT (Right)  Patient Location: PACU  Anesthesia Type:General  Level of Consciousness:  sedated, patient cooperative and responds to stimulation  Airway & Oxygen Therapy:Patient Spontanous Breathing and Patient connected to face mask oxgen  Post-op Assessment:  Report given to PACU RN and Post -op Vital signs reviewed and stable  Post vital signs:  Reviewed and stable  Last Vitals:  Filed Vitals:   12/30/15 0909  BP: 152/75  Pulse: 84  Temp: 36.8 C  Resp: 18    Complications: No apparent anesthesia complications

## 2015-12-30 NOTE — Brief Op Note (Signed)
12/30/2015  2:18 PM  PATIENT:  Leah [name]  60 y.o. female  PRE-OPERATIVE DIAGNOSIS:  RIGHT RENAL CALCULI  POST-OPERATIVE DIAGNOSIS:  RIGHT RENAL CALCULI  PROCEDURE:  Procedure(s): RIght Nephrostomy with access (Right) CYSTOSCOPY WITH STENT PLACEMENT (Right)  SURGEON:  Surgeon(s) and Role:    * Cleon Gustin, MD - Primary  PHYSICIAN ASSISTANT:   ASSISTANTS: none   ANESTHESIA:   general  EBL:  Total I/O In: 1000 [I.V.:1000] Out: 525 [Urine:125; Blood:400]  BLOOD ADMINISTERED:none  DRAINS: Urinary Catheter (Foley), right nephrostomy tube  LOCAL MEDICATIONS USED:  NONE  SPECIMEN:  No Specimen  DISPOSITION OF SPECIMEN:  N/A  COUNTS:  YES  TOURNIQUET:  * No tourniquets in log *  DICTATION: .Note written in EPIC  PLAN OF CARE: Admit for overnight observation  PATIENT DISPOSITION:  PACU - hemodynamically stable.   Delay start of Pharmacological VTE agent (>24hrs) due to surgical blood loss or risk of bleeding: not applicable

## 2015-12-30 NOTE — Anesthesia Preprocedure Evaluation (Addendum)
Anesthesia Evaluation  Patient identified by MRN, date of birth, ID band Patient awake    Reviewed: Allergy & Precautions, NPO status , Patient's Chart, lab work & pertinent test results, reviewed documented beta blocker date and time   Airway Mallampati: II  TM Distance: >3 FB Neck ROM: Full    Dental no notable dental hx. (+) Edentulous Upper, Edentulous Lower   Pulmonary Current Smoker,    Pulmonary exam normal breath sounds clear to auscultation       Cardiovascular Exercise Tolerance: Good hypertension, Pt. on medications and Pt. on home beta blockers + CAD and + Past MI  Normal cardiovascular exam Rhythm:Regular Rate:Normal  MI in 1996 PTCA with stent x 2   Neuro/Psych PSYCHIATRIC DISORDERS negative neurological ROS     GI/Hepatic negative GI ROS, Neg liver ROS,   Endo/Other  diabetes, Well Controlled, Type 2Obesity Hyperlipidemia  Renal/GU Right renal calculus  negative genitourinary   Musculoskeletal  (+) Arthritis ,   Abdominal (+) + obese,   Peds  Hematology Erythrocytosis   Anesthesia Other Findings   Reproductive/Obstetrics negative OB ROS                           Anesthesia Physical Anesthesia Plan  ASA: III  Anesthesia Plan: General   Post-op Pain Management:    Induction: Intravenous  Airway Management Planned: Oral ETT  Additional Equipment:   Intra-op Plan:   Post-operative Plan: Extubation in OR  Informed Consent: I have reviewed the patients History and Physical, chart, labs and discussed the procedure including the risks, benefits and alternatives for the proposed anesthesia with the patient or authorized representative who has indicated his/her understanding and acceptance.   Dental advisory given  Plan Discussed with: CRNA, Anesthesiologist and Surgeon  Anesthesia Plan Comments:         Anesthesia Quick Evaluation

## 2015-12-31 ENCOUNTER — Encounter (HOSPITAL_COMMUNITY): Payer: Self-pay | Admitting: Urology

## 2015-12-31 ENCOUNTER — Other Ambulatory Visit: Payer: Self-pay | Admitting: Urology

## 2015-12-31 DIAGNOSIS — N200 Calculus of kidney: Secondary | ICD-10-CM | POA: Diagnosis not present

## 2015-12-31 LAB — CBC
HCT: 38.2 % (ref 36.0–46.0)
Hemoglobin: 12.5 g/dL (ref 12.0–15.0)
MCH: 29.2 pg (ref 26.0–34.0)
MCHC: 32.7 g/dL (ref 30.0–36.0)
MCV: 89.3 fL (ref 78.0–100.0)
PLATELETS: 168 10*3/uL (ref 150–400)
RBC: 4.28 MIL/uL (ref 3.87–5.11)
RDW: 14.2 % (ref 11.5–15.5)
WBC: 15 10*3/uL — ABNORMAL HIGH (ref 4.0–10.5)

## 2015-12-31 LAB — BASIC METABOLIC PANEL
Anion gap: 9 (ref 5–15)
BUN: 13 mg/dL (ref 6–20)
CHLORIDE: 107 mmol/L (ref 101–111)
CO2: 24 mmol/L (ref 22–32)
Calcium: 8.6 mg/dL — ABNORMAL LOW (ref 8.9–10.3)
Creatinine, Ser: 0.87 mg/dL (ref 0.44–1.00)
GFR calc Af Amer: >60 mL/min (ref >60)
GFR calc non Af Amer: >60 mL/min (ref >60)
GLUCOSE: 152 mg/dL — AB (ref 65–99)
POTASSIUM: 3.9 mmol/L (ref 3.5–5.1)
Sodium: 140 mmol/L (ref 135–145)

## 2015-12-31 MED ORDER — OXYCODONE-ACETAMINOPHEN 5-325 MG PO TABS: 1.0000 | ORAL_TABLET | ORAL | Status: DC | PRN

## 2015-12-31 NOTE — Discharge Instructions (Signed)
Percutaneous Nephrostomy, Care After °Refer to this sheet in the next few weeks. These instructions provide you with information on caring for yourself after your procedure. Your health care provider may also give you more specific instructions. Your treatment has been planned according to current medical practices, but problems sometimes occur. Call your health care provider if you have any problems or questions after your procedure. °WHAT TO EXPECT AFTER THE PROCEDURE °You will need to remain lying down for several hours. °HOME CARE INSTRUCTIONS °· Your nephrostomy tube is connected to a leg bag or bedside drainage bag. Always keep the tubing, the leg bag, or the bedside drainage bags below the level of the kidney so that the urine drains freely. °· During the day, if you are connecting the nephrostomy tube to a leg bag, be sure there are no kinks in the tubing and that the urine is draining freely. °· At night, you may want to connect the nephrostomy tube or the leg bag to a larger bedside drainage bag. °· Change the dressing as often as directed by your health care provider, or if it becomes wet. °¨ Gently remove the tapes and dressing from around the nephrostomy tube. Be careful not to pull on the tube while removing the dressing. °¨ Wash the skin around the tube, rinse well, and dry. °¨ Place two split drain sponges in and around the tube exit site. °¨ Place tape around edge of the dressing. °¨ Secure the nephrostomy tubing. Remember to make certain that the nephrostomy tube does not kink or become pinched closed. It can be useful to wrap any exposed tubing going from the nephrostomy tube to any of the connecting tubes to either the leg bag or drainage bag with an elastic bandage. °· Every three weeks, replace the leg bag, drainage bag, and any extension tubing connected to your nephrostomy tube. Your health care provider will explain how to change the drainage bag and extension tubing. °SEEK MEDICAL CARE  IF: °· You experience any problems with any of the valves or tubing. °· You have persistent pain or soreness in your back. °· You have a fever or chills. °SEEK IMMEDIATE MEDICAL CARE IF: °· You have abdominal pain during the first week. °· You have a new appearance of blood in your urine. °· You have back pain that is not relieved by your medicine. °· You have drainage, redness, swelling, or pain at the tube insertion site. °· You have decreased urine output. °· Your nephrostomy tube comes out. °  °This information is not intended to replace advice given to you by your health care provider. Make sure you discuss any questions you have with your health care provider. °  °Document Released: 06/25/2004 Document Revised: 11/23/2014 Document Reviewed: 06/29/2013 °Elsevier Interactive Patient Education ©2016 [employer] ° °

## 2016-01-06 NOTE — Op Note (Signed)
Preoperative diagnosis: Right renal stone  Postoperative diagnosis: Same  Procedure 1.  Right percutaneous nephrostolithotomy for stone less than 2 cm 2.  Right nephrostogram 3.  Intraoperative fluoroscopy, under 1 hour, with interpretation 4.  Percutaneous access into the Right renal collecting system 5.  Placement of a 53 French nephrostomy tube 6.  Dilation of percutaneous tract  Attending: Dr. Alyson Ingles  Anesthesia: General  Estimated blood loss: 300cc  Antibiotics: ancef  Drains: 1.  30 French Foley catheter 2.  18 French nephrostomy tube  Specimens: none  Findings: right UPJ calculus, right lower and upper pole calculus. Access into the mid pole  Indications: Patient is a 60 year old female with a history of large right renal calculi.  After discussing treatment options and they decided to proceed with right percutaneous nephrostolithotomy.    Procedure in detail: Prior to procedure consent was obtained.  Patient was brought to the operating room and a timeout was done to ensure correct patient, correct procedure, and correct site.  General anesthesia was administered. The patients genetalia was prepped and draped. A flexible cystoscope was passed into the urethra and then the bladder. No masses or lesions were seen in the bladder. Ureteral orifices were in the normal anatomic location. A sensor wire was passed into the right ureter and up to the renal pelvis. A 6 french ureteral catheter was advanced over the wire and up to the renal pelvis the wire was then removed and so was the cystoscope.  A 16 French Foley catheter was in place and the ureteral catheter was secured with a 0 silk tie.  The patient was then placed in the prone position.   The right flank was then prepped and draped in usual sterile fashion.  Contrast was instilled throught the ureteral catheter and the bullseye technique was used to gain access into the mid pole. Once we gained access a sensor wire was advanced  through the spinal needle and down the ureter to the bladder. We then used an 8 french and 10 french dilater to dilate the tract. We then used a sheath to place a second wire down to the bladder.  We then made an incision at the level of the skin and over the wire we then placed a NephroMax dilator.  We dilated the nephrostomy tract to 30 [place] and held this 18 cm of water for 1 minute.  We then  placed the access sheath over the balloon.  The balloon was then deflated.  We then used a rigid nephroscope to perform nephroscopy.  We encountered a large lower pole, and UPJ stone. We used the grapser to remove smaller stones in the lower pole. Visualization then became difficult due to bleeding and we elected to place a nephrostomy tube. We then placed a 18 French nephrostomy tube through the sheath into the renal pelvis.  The balloon was inflated with 3 mls of contrast.  We then removed the access sheath in and obtained another nephrostogram.  We noted minimal extravasation of contrast.  We then secured the nephrostomy tube with 0 silks in interrupted fashion.  Dressing was placed over the nephrostomy tube site and this then concluded the procedure was well-tolerated by the patient.  Complications: None  Condition: Stable, extubated, transferred to PACU  Plan: Patient is to be admitted overnight for observation.  Foley catheter through the morning.  Pt is then to be discharged home and followup in 2 weeks second look percutaneous nephrostolithotomy

## 2016-01-06 NOTE — Discharge Summary (Signed)
Physician Discharge Summary  Patient ID: Leah [name] MRN: [id number] DOB/AGE: 1956-07-13 60 y.o.  Admit date: 12/30/2015 Discharge date: 12/31/2015  Admission Diagnoses: renal calculi  Discharge Diagnoses:  Active Problems:   Renal calculi   Discharged Condition: good  Hospital Course: The patient tolerated the procedure well and was transferred to the floor on IV pain meds, IV fluid. On POD#1 foley was removed, pt was started on regular diet and they ambulated in the halls. Prior to discharge the pt was tolerating a regular diet, pain was controlled on PO pain meds, they were ambulating without difficulty, and they had normal bowel function.   Consults: None  Significant Diagnostic Studies: none  Treatments: IV hydration and surgery: R PCNL  Discharge Exam: Blood pressure 155/87, pulse 78, temperature 98.2 F (36.8 C), temperature source Oral, resp. rate 16, height 5\' 3"  (1.6 m), weight 82.555 kg (182 lb), SpO2 100 %. General appearance: alert, cooperative and appears stated age Head: Normocephalic, without obvious abnormality, atraumatic Eyes: conjunctivae/corneas clear. PERRL, EOM's intact. Fundi benign. Neck: no adenopathy, no carotid bruit, no JVD, supple, symmetrical, trachea midline and thyroid not enlarged, symmetric, no tenderness/mass/nodules Resp: clear to auscultation bilaterally Cardio: regular rate and rhythm, S1, S2 normal, no murmur, click, rub or gallop GI: soft, non-tender; bowel sounds normal; no masses,  no organomegaly Extremities: extremities normal, atraumatic, no cyanosis or edema Neurologic: Grossly normal  Disposition: 01-Home or Self Care     Medication List    TAKE these medications        ADVIL 200 MG tablet  Generic drug:  ibuprofen  Take 200 mg by mouth every 6 (six) hours as needed for headache or moderate pain.     amLODipine 10 MG tablet  Commonly known as:  NORVASC  Take 10 mg by mouth daily.     aspirin EC 81 MG tablet  Take  81 mg by mouth every morning.     folic acid Q000111Q MCG tablet  Commonly known as:  FOLVITE  Take 1 mcg by mouth every morning.     LIPITOR 80 MG tablet  Generic drug:  atorvastatin  Take 80 mg by mouth every morning.     lisinopril 10 MG tablet  Commonly known as:  PRINIVIL,ZESTRIL  Take 10 mg by mouth every morning.     metoprolol succinate 50 MG 24 hr tablet  Commonly known as:  TOPROL-XL  Take 50 mg by mouth daily. Take with or immediately following a meal.     multivitamin with minerals Tabs tablet  Take 1 tablet by mouth every morning.     OMEGA 3 500 PO  Take 500 mg by mouth every morning.     oxyCODONE-acetaminophen 5-325 MG tablet  Commonly known as:  PERCOCET/ROXICET  Take 1-2 tablets by mouth every 4 (four) hours as needed for moderate pain.     Selenium 200 MCG Tabs  Take 200 mcg by mouth every morning.     Vitamin D-3 1000 units Caps  Take 1,000 Units by mouth 2 (two) times daily.         SignedCleon Gustin 01/06/2016, 6:27 AM

## 2016-01-08 ENCOUNTER — Encounter (HOSPITAL_COMMUNITY): Payer: Self-pay

## 2016-01-08 ENCOUNTER — Encounter (HOSPITAL_COMMUNITY)
Admission: RE | Admit: 2016-01-08 | Discharge: 2016-01-08 | Disposition: A | Discharge status: Home / Self Care | Payer: BLUE CROSS/BLUE SHIELD | Source: Ambulatory Visit | Attending: Urology | Admitting: Urology

## 2016-01-08 DIAGNOSIS — Z791 Long term (current) use of non-steroidal anti-inflammatories (NSAID): Secondary | ICD-10-CM | POA: Diagnosis not present

## 2016-01-08 DIAGNOSIS — Z23 Encounter for immunization: Secondary | ICD-10-CM | POA: Diagnosis not present

## 2016-01-08 DIAGNOSIS — I10 Essential (primary) hypertension: Secondary | ICD-10-CM | POA: Diagnosis not present

## 2016-01-08 DIAGNOSIS — F17210 Nicotine dependence, cigarettes, uncomplicated: Secondary | ICD-10-CM | POA: Diagnosis not present

## 2016-01-08 DIAGNOSIS — E119 Type 2 diabetes mellitus without complications: Secondary | ICD-10-CM | POA: Diagnosis not present

## 2016-01-08 DIAGNOSIS — Z7982 Long term (current) use of aspirin: Secondary | ICD-10-CM | POA: Diagnosis not present

## 2016-01-08 DIAGNOSIS — I252 Old myocardial infarction: Secondary | ICD-10-CM | POA: Diagnosis not present

## 2016-01-08 DIAGNOSIS — M1990 Unspecified osteoarthritis, unspecified site: Secondary | ICD-10-CM | POA: Diagnosis not present

## 2016-01-08 DIAGNOSIS — R109 Unspecified abdominal pain: Secondary | ICD-10-CM | POA: Diagnosis present

## 2016-01-08 DIAGNOSIS — N200 Calculus of kidney: Secondary | ICD-10-CM | POA: Diagnosis not present

## 2016-01-08 DIAGNOSIS — Z87442 Personal history of urinary calculi: Secondary | ICD-10-CM | POA: Diagnosis not present

## 2016-01-08 DIAGNOSIS — I2510 Atherosclerotic heart disease of native coronary artery without angina pectoris: Secondary | ICD-10-CM | POA: Diagnosis not present

## 2016-01-08 DIAGNOSIS — Z955 Presence of coronary angioplasty implant and graft: Secondary | ICD-10-CM | POA: Diagnosis not present

## 2016-01-08 DIAGNOSIS — Z79899 Other long term (current) drug therapy: Secondary | ICD-10-CM | POA: Diagnosis not present

## 2016-01-08 DIAGNOSIS — E785 Hyperlipidemia, unspecified: Secondary | ICD-10-CM | POA: Diagnosis not present

## 2016-01-08 HISTORY — DX: Tinnitus, unspecified ear: H93.19

## 2016-01-08 HISTORY — DX: Presence of spectacles and contact lenses: Z97.3

## 2016-01-08 LAB — CBC
HCT: 38.5 % (ref 36.0–46.0)
HEMOGLOBIN: 12.9 g/dL (ref 12.0–15.0)
MCH: 29.6 pg (ref 26.0–34.0)
MCHC: 33.5 g/dL (ref 30.0–36.0)
MCV: 88.3 fL (ref 78.0–100.0)
PLATELETS: 324 10*3/uL (ref 150–400)
RBC: 4.36 MIL/uL (ref 3.87–5.11)
RDW: 14.1 % (ref 11.5–15.5)
WBC: 10 10*3/uL (ref 4.0–10.5)

## 2016-01-08 NOTE — Progress Notes (Signed)
EKG / epic 12/27/2015. Dr Lyndle Herrlich / anesthesia reviewed pts H&P and EKG from 12/27/2015. No orders given. Anesthesia to see pt day of surgery.  BMP / epic 12/31/2015

## 2016-01-08 NOTE — Progress Notes (Signed)
Pt stated still had enough chlorahexidine soap at home to complete showers as prescribed prior to surgical procedure

## 2016-01-08 NOTE — Patient Instructions (Signed)
SHYLEE CLERE  01/08/2016   Your procedure is scheduled on: Thursday January 09, 2016  Report to [facility] Main  Entrance take [place]  elevators to 3rd floor to  [place] at 10:00 AM.  Call this number if you have problems the morning of surgery [phone]   Remember: ONLY 1 PERSON MAY GO WITH YOU TO SHORT STAY TO GET  READY MORNING OF [place].  Do not eat food or drink liquids :After Midnight.     Take these medicines the morning of surgery with A SIP OF WATER: Amlodipine (Norvasc); Metoprolol: Oxycodone-Acetaminophen if needed  DO NOT TAKE ANY DIABETIC MEDICATIONS DAY OF YOUR SURGERY                               You may not have any metal on your body including hair pins and              piercings  Do not wear jewelry, make-up, lotions, powders or perfumes, deodorant             Do not wear nail polish.  Do not shave  48 hours prior to surgery.            Do not bring valuables to the hospital. [place].  Contacts, dentures or bridgework may not be worn into surgery.  Leave suitcase in the car. After surgery it may be brought to your room.            _____________________________________________________________________             [facility] - Preparing for Surgery Before surgery, you can play an important role.  Because skin is not sterile, your skin needs to be as free of germs as possible.  You can reduce the number of germs on your skin by washing with CHG (chlorahexidine gluconate) soap before surgery.  CHG is an antiseptic cleaner which kills germs and bonds with the skin to continue killing germs even after washing. Please DO NOT use if you have an allergy to CHG or antibacterial soaps.  If your skin becomes reddened/irritated stop using the CHG and inform your nurse when you arrive at Short Stay. Do not shave (including legs and underarms) for at least 48 hours prior to the first  CHG shower.  You may shave your face/neck. Please follow these instructions carefully:  1.  Shower with CHG Soap the night before surgery and the  morning of Surgery.  2.  If you choose to wash your hair, wash your hair first as usual with your  normal  shampoo.  3.  After you shampoo, rinse your hair and body thoroughly to remove the  shampoo.                           4.  Use CHG as you would any other liquid soap.  You can apply chg directly  to the skin and wash                       Gently with a scrungie or clean washcloth.  5.  Apply the CHG Soap to your body ONLY FROM THE NECK DOWN.  Do not use on face/ open                           Wound or open sores. Avoid contact with eyes, ears mouth and genitals (private parts).                       Wash face,  Genitals (private parts) with your normal soap.             6.  Wash thoroughly, paying special attention to the area where your surgery  will be performed.  7.  Thoroughly rinse your body with warm water from the neck down.  8.  DO NOT shower/wash with your normal soap after using and rinsing off  the CHG Soap.                9.  Pat yourself dry with a clean towel.            10.  Wear clean pajamas.            11.  Place clean sheets on your bed the night of your first shower and do not  sleep with pets. Day of Surgery : Do not apply any lotions/deodorants the morning of surgery.  Please wear clean clothes to the hospital/surgery center.  FAILURE TO FOLLOW THESE INSTRUCTIONS MAY RESULT IN THE CANCELLATION OF YOUR SURGERY PATIENT SIGNATURE_________________________________  NURSE SIGNATURE__________________________________  ________________________________________________________________________

## 2016-01-09 ENCOUNTER — Ambulatory Visit (HOSPITAL_COMMUNITY): Payer: BLUE CROSS/BLUE SHIELD

## 2016-01-09 ENCOUNTER — Ambulatory Visit (HOSPITAL_COMMUNITY): Payer: BLUE CROSS/BLUE SHIELD | Admitting: Certified Registered Nurse Anesthetist

## 2016-01-09 ENCOUNTER — Encounter (HOSPITAL_COMMUNITY)
Admission: RE | Disposition: A | Discharge status: Home / Self Care | Payer: Self-pay | Source: Ambulatory Visit | Attending: Urology

## 2016-01-09 ENCOUNTER — Encounter (HOSPITAL_COMMUNITY): Payer: Self-pay | Admitting: *Deleted

## 2016-01-09 ENCOUNTER — Observation Stay (HOSPITAL_COMMUNITY)
Admission: RE | Admit: 2016-01-09 | Discharge: 2016-01-10 | Disposition: A | Discharge status: Home / Self Care | Payer: BLUE CROSS/BLUE SHIELD | Source: Ambulatory Visit | Attending: Urology | Admitting: Urology

## 2016-01-09 DIAGNOSIS — F17210 Nicotine dependence, cigarettes, uncomplicated: Secondary | ICD-10-CM | POA: Insufficient documentation

## 2016-01-09 DIAGNOSIS — N200 Calculus of kidney: Principal | ICD-10-CM | POA: Insufficient documentation

## 2016-01-09 DIAGNOSIS — E785 Hyperlipidemia, unspecified: Secondary | ICD-10-CM | POA: Insufficient documentation

## 2016-01-09 DIAGNOSIS — Z23 Encounter for immunization: Secondary | ICD-10-CM | POA: Insufficient documentation

## 2016-01-09 DIAGNOSIS — Z79899 Other long term (current) drug therapy: Secondary | ICD-10-CM | POA: Insufficient documentation

## 2016-01-09 DIAGNOSIS — Z791 Long term (current) use of non-steroidal anti-inflammatories (NSAID): Secondary | ICD-10-CM | POA: Insufficient documentation

## 2016-01-09 DIAGNOSIS — Z87442 Personal history of urinary calculi: Secondary | ICD-10-CM | POA: Insufficient documentation

## 2016-01-09 DIAGNOSIS — I252 Old myocardial infarction: Secondary | ICD-10-CM | POA: Insufficient documentation

## 2016-01-09 DIAGNOSIS — Z955 Presence of coronary angioplasty implant and graft: Secondary | ICD-10-CM | POA: Insufficient documentation

## 2016-01-09 DIAGNOSIS — M1990 Unspecified osteoarthritis, unspecified site: Secondary | ICD-10-CM | POA: Insufficient documentation

## 2016-01-09 DIAGNOSIS — Z7982 Long term (current) use of aspirin: Secondary | ICD-10-CM | POA: Insufficient documentation

## 2016-01-09 DIAGNOSIS — E119 Type 2 diabetes mellitus without complications: Secondary | ICD-10-CM | POA: Insufficient documentation

## 2016-01-09 DIAGNOSIS — I10 Essential (primary) hypertension: Secondary | ICD-10-CM | POA: Insufficient documentation

## 2016-01-09 DIAGNOSIS — I2510 Atherosclerotic heart disease of native coronary artery without angina pectoris: Secondary | ICD-10-CM | POA: Insufficient documentation

## 2016-01-09 HISTORY — PX: CYSTOSCOPY WITH STENT PLACEMENT: SHX5790

## 2016-01-09 HISTORY — PX: NEPHROLITHOTOMY: SHX5134

## 2016-01-09 HISTORY — PX: HOLMIUM LASER APPLICATION: SHX5852

## 2016-01-09 LAB — BASIC METABOLIC PANEL WITH GFR
Anion gap: 8 (ref 5–15)
BUN: 8 mg/dL (ref 6–20)
CO2: 26 mmol/L (ref 22–32)
Calcium: 8.8 mg/dL — ABNORMAL LOW (ref 8.9–10.3)
Chloride: 109 mmol/L (ref 101–111)
Creatinine, Ser: 0.85 mg/dL (ref 0.44–1.00)
GFR calc Af Amer: >60 mL/min
GFR calc non Af Amer: >60 mL/min
Glucose, Bld: 122 mg/dL — ABNORMAL HIGH (ref 65–99)
Potassium: 4 mmol/L (ref 3.5–5.1)
Sodium: 143 mmol/L (ref 135–145)

## 2016-01-09 LAB — CBC
HCT: 38.5 % (ref 36.0–46.0)
Hemoglobin: 13.1 g/dL (ref 12.0–15.0)
MCH: 30.4 pg (ref 26.0–34.0)
MCHC: 34 g/dL (ref 30.0–36.0)
MCV: 89.3 fL (ref 78.0–100.0)
Platelets: 293 K/uL (ref 150–400)
RBC: 4.31 MIL/uL (ref 3.87–5.11)
RDW: 14.2 % (ref 11.5–15.5)
WBC: 10 K/uL (ref 4.0–10.5)

## 2016-01-09 LAB — GLUCOSE, CAPILLARY
GLUCOSE-CAPILLARY: 130 mg/dL — AB (ref 65–99)
GLUCOSE-CAPILLARY: 113 mg/dL — AB (ref 65–99)

## 2016-01-09 LAB — HEMOGLOBIN A1C
HEMOGLOBIN A1C: 6.6 % — AB (ref 4.8–5.6)
MEAN PLASMA GLUCOSE: 143 mg/dL

## 2016-01-09 SURGERY — NEPHROLITHOTOMY PERCUTANEOUS SECOND LOOK
Anesthesia: General | Site: Bladder | Laterality: Right

## 2016-01-09 MED ORDER — PHENYLEPHRINE HCL 10 MG/ML IJ SOLN
INTRAMUSCULAR | Status: DC | PRN
  Administered 2016-01-09: 80 ug via INTRAVENOUS
  Administered 2016-01-09 (×2): 40 ug via INTRAVENOUS
  Administered 2016-01-09 (×3): 80 ug via INTRAVENOUS

## 2016-01-09 MED ORDER — BELLADONNA ALKALOIDS-OPIUM 16.2-60 MG RE SUPP: 1.0000 | Freq: Four times a day (QID) | RECTAL | Status: DC | PRN

## 2016-01-09 MED ORDER — EPHEDRINE SULFATE 50 MG/ML IJ SOLN
INTRAMUSCULAR | Status: DC | PRN
  Administered 2016-01-09: 5 mg via INTRAVENOUS
  Administered 2016-01-09: 10 mg via INTRAVENOUS
  Administered 2016-01-09: 5 mg via INTRAVENOUS

## 2016-01-09 MED ORDER — HYDROMORPHONE HCL 1 MG/ML IJ SOLN: 0.5000 mg | INTRAMUSCULAR | Status: DC | PRN

## 2016-01-09 MED ORDER — DIPHENHYDRAMINE HCL 12.5 MG/5ML PO ELIX: 12.5000 mg | ORAL_SOLUTION | Freq: Four times a day (QID) | ORAL | Status: DC | PRN

## 2016-01-09 MED ORDER — FENTANYL CITRATE (PF) 100 MCG/2ML IJ SOLN
INTRAMUSCULAR | Status: AC
  Filled 2016-01-09: qty 2

## 2016-01-09 MED ORDER — LIDOCAINE HCL (CARDIAC) 20 MG/ML IV SOLN
INTRAVENOUS | Status: DC | PRN
  Administered 2016-01-09 (×2): 80 mg via INTRAVENOUS

## 2016-01-09 MED ORDER — ASPIRIN EC 81 MG PO TBEC
81.0000 mg | DELAYED_RELEASE_TABLET | Freq: Every morning | ORAL | Status: DC
  Administered 2016-01-10: 81 mg via ORAL
  Filled 2016-01-09: qty 1

## 2016-01-09 MED ORDER — PROMETHAZINE HCL 25 MG/ML IJ SOLN: 6.2500 mg | INTRAMUSCULAR | Status: DC | PRN

## 2016-01-09 MED ORDER — DEXAMETHASONE SODIUM PHOSPHATE 10 MG/ML IJ SOLN
INTRAMUSCULAR | Status: AC
  Filled 2016-01-09: qty 1

## 2016-01-09 MED ORDER — LIDOCAINE HCL (CARDIAC) 20 MG/ML IV SOLN
INTRAVENOUS | Status: AC
  Filled 2016-01-09: qty 5

## 2016-01-09 MED ORDER — ROCURONIUM BROMIDE 100 MG/10ML IV SOLN
INTRAVENOUS | Status: AC
  Filled 2016-01-09: qty 1

## 2016-01-09 MED ORDER — FENTANYL CITRATE (PF) 250 MCG/5ML IJ SOLN
INTRAMUSCULAR | Status: DC | PRN
  Administered 2016-01-09 (×2): 50 ug via INTRAVENOUS

## 2016-01-09 MED ORDER — LACTATED RINGERS IV SOLN
INTRAVENOUS | Status: DC
  Administered 2016-01-09: 1000 mL via INTRAVENOUS
  Administered 2016-01-09: 14:00:00 via INTRAVENOUS

## 2016-01-09 MED ORDER — DEXTROSE 5 % IV SOLN
INTRAVENOUS | Status: AC
  Filled 2016-01-09: qty 2

## 2016-01-09 MED ORDER — DOCUSATE SODIUM 100 MG PO CAPS
100.0000 mg | ORAL_CAPSULE | Freq: Two times a day (BID) | ORAL | Status: DC
  Administered 2016-01-10: 100 mg via ORAL
  Filled 2016-01-09: qty 1

## 2016-01-09 MED ORDER — PROPOFOL 10 MG/ML IV BOLUS
INTRAVENOUS | Status: DC | PRN
  Administered 2016-01-09: 130 mg via INTRAVENOUS

## 2016-01-09 MED ORDER — METOPROLOL SUCCINATE ER 50 MG PO TB24
50.0000 mg | ORAL_TABLET | Freq: Every day | ORAL | Status: DC
  Administered 2016-01-10: 50 mg via ORAL
  Filled 2016-01-09: qty 1

## 2016-01-09 MED ORDER — MIDAZOLAM HCL 2 MG/2ML IJ SOLN
INTRAMUSCULAR | Status: AC
  Filled 2016-01-09: qty 2

## 2016-01-09 MED ORDER — DEXTROSE 5 % IV SOLN
1.0000 g | INTRAVENOUS | Status: DC
  Administered 2016-01-10: 1 g via INTRAVENOUS
  Filled 2016-01-09: qty 10

## 2016-01-09 MED ORDER — INFLUENZA VAC SPLIT QUAD 0.5 ML IM SUSY
0.5000 mL | PREFILLED_SYRINGE | INTRAMUSCULAR | Status: AC
  Administered 2016-01-10: 0.5 mL via INTRAMUSCULAR
  Filled 2016-01-09 (×2): qty 0.5

## 2016-01-09 MED ORDER — IOHEXOL 300 MG/ML  SOLN
INTRAMUSCULAR | Status: DC | PRN
  Administered 2016-01-09: 10 mL via ORAL

## 2016-01-09 MED ORDER — ATORVASTATIN CALCIUM 80 MG PO TABS
80.0000 mg | ORAL_TABLET | Freq: Every morning | ORAL | Status: DC
  Administered 2016-01-10: 80 mg via ORAL
  Filled 2016-01-09: qty 1

## 2016-01-09 MED ORDER — SUCCINYLCHOLINE 20MG/ML (10ML) SYRINGE FOR MEDFUSION PUMP - OPTIME
INTRAMUSCULAR | Status: DC | PRN
  Administered 2016-01-09: 100 mg via INTRAVENOUS

## 2016-01-09 MED ORDER — PROPOFOL 10 MG/ML IV BOLUS
INTRAVENOUS | Status: AC
  Filled 2016-01-09: qty 20

## 2016-01-09 MED ORDER — SODIUM CHLORIDE 0.9 % IV SOLN
INTRAVENOUS | Status: DC
  Administered 2016-01-09 – 2016-01-10 (×2): via INTRAVENOUS

## 2016-01-09 MED ORDER — ACETAMINOPHEN 325 MG PO TABS: 650.0000 mg | ORAL_TABLET | ORAL | Status: DC | PRN

## 2016-01-09 MED ORDER — ZOLPIDEM TARTRATE 5 MG PO TABS: 5.0000 mg | ORAL_TABLET | Freq: Every evening | ORAL | Status: DC | PRN

## 2016-01-09 MED ORDER — DIPHENHYDRAMINE HCL 50 MG/ML IJ SOLN: 12.5000 mg | Freq: Four times a day (QID) | INTRAMUSCULAR | Status: DC | PRN

## 2016-01-09 MED ORDER — ROCURONIUM BROMIDE 100 MG/10ML IV SOLN
INTRAVENOUS | Status: DC | PRN
  Administered 2016-01-09: 10 mg via INTRAVENOUS
  Administered 2016-01-09: 30 mg via INTRAVENOUS
  Administered 2016-01-09: 10 mg via INTRAVENOUS

## 2016-01-09 MED ORDER — SODIUM CHLORIDE 0.9 % IR SOLN
Status: DC | PRN
  Administered 2016-01-09: 5000 mL

## 2016-01-09 MED ORDER — OXYCODONE-ACETAMINOPHEN 5-325 MG PO TABS
1.0000 | ORAL_TABLET | ORAL | Status: DC | PRN
  Administered 2016-01-09: 1 via ORAL
  Filled 2016-01-09: qty 2

## 2016-01-09 MED ORDER — ONDANSETRON HCL 4 MG/2ML IJ SOLN: 4.0000 mg | INTRAMUSCULAR | Status: DC | PRN

## 2016-01-09 MED ORDER — DEXTROSE 5 % IV SOLN
2.0000 g | INTRAVENOUS | Status: AC
  Administered 2016-01-09: 2 g via INTRAVENOUS

## 2016-01-09 MED ORDER — ONDANSETRON HCL 4 MG/2ML IJ SOLN
INTRAMUSCULAR | Status: AC
  Filled 2016-01-09: qty 2

## 2016-01-09 MED ORDER — SUGAMMADEX SODIUM 200 MG/2ML IV SOLN
INTRAVENOUS | Status: DC | PRN
  Administered 2016-01-09: 160 mg via INTRAVENOUS

## 2016-01-09 MED ORDER — MIDAZOLAM HCL 5 MG/5ML IJ SOLN
INTRAMUSCULAR | Status: DC | PRN
  Administered 2016-01-09: 2 mg via INTRAVENOUS

## 2016-01-09 MED ORDER — SUGAMMADEX SODIUM 200 MG/2ML IV SOLN
INTRAVENOUS | Status: AC
  Filled 2016-01-09: qty 2

## 2016-01-09 MED ORDER — HYDROMORPHONE HCL 1 MG/ML IJ SOLN: 0.2500 mg | INTRAMUSCULAR | Status: DC | PRN

## 2016-01-09 MED ORDER — DEXAMETHASONE SODIUM PHOSPHATE 10 MG/ML IJ SOLN
INTRAMUSCULAR | Status: DC | PRN
  Administered 2016-01-09: 10 mg via INTRAVENOUS

## 2016-01-09 SURGICAL SUPPLY — 69 items
APL SKNCLS STERI-STRIP NONHPOA (GAUZE/BANDAGES/DRESSINGS) ×4
BAG URINE DRAINAGE (UROLOGICAL SUPPLIES) ×6 IMPLANT
BASKET STONE NITINOL 3FR/115 (UROLOGICAL SUPPLIES) ×1 IMPLANT
BASKET ZERO TIP NITINOL 2.4FR (BASKET) IMPLANT
BENZOIN TINCTURE PRP APPL 2/3 (GAUZE/BANDAGES/DRESSINGS) ×6 IMPLANT
BLADE SURG 15 STRL LF DISP TIS (BLADE) ×2 IMPLANT
BLADE SURG 15 STRL SS (BLADE) ×3
BSKT STON RTRVL ZERO TP 2.4FR (BASKET)
CATCHER STONE W/TUBE ADAPTER (UROLOGICAL SUPPLIES) IMPLANT
CATH FOLEY 2W COUNCIL 20FR 5CC (CATHETERS) IMPLANT
CATH FOLEY 2WAY SLVR  5CC 16FR (CATHETERS) ×2
CATH FOLEY 2WAY SLVR  5CC 18FR (CATHETERS) ×1
CATH FOLEY 2WAY SLVR 5CC 16FR (CATHETERS) ×2 IMPLANT
CATH FOLEY 2WAY SLVR 5CC 18FR (CATHETERS) ×2 IMPLANT
CATH IMAGER II 65CM (CATHETERS) ×3 IMPLANT
CATH INTERMIT  6FR 70CM (CATHETERS) ×3 IMPLANT
CATH ROBINSON RED A/P 20FR (CATHETERS) IMPLANT
CATH X-FORCE N30 NEPHROSTOMY (TUBING) ×3 IMPLANT
COVER SURGICAL LIGHT HANDLE (MISCELLANEOUS) ×3 IMPLANT
DRAPE C-ARM 42X120 X-RAY (DRAPES) ×3 IMPLANT
DRAPE LINGEMAN PERC (DRAPES) ×3 IMPLANT
DRAPE SHEET LG 3/4 BI-LAMINATE (DRAPES) ×3 IMPLANT
DRAPE SURG IRRIG POUCH 19X23 (DRAPES) ×3 IMPLANT
DRSG PAD ABDOMINAL 8X10 ST (GAUZE/BANDAGES/DRESSINGS) ×5 IMPLANT
DRSG TEGADERM 4X4.75 (GAUZE/BANDAGES/DRESSINGS) ×1 IMPLANT
DRSG TEGADERM 8X12 (GAUZE/BANDAGES/DRESSINGS) ×5 IMPLANT
FIBER LASER FLEXIVA 1000 (UROLOGICAL SUPPLIES) IMPLANT
FIBER LASER FLEXIVA 200 (UROLOGICAL SUPPLIES) ×1 IMPLANT
FIBER LASER FLEXIVA 365 (UROLOGICAL SUPPLIES) ×1 IMPLANT
FIBER LASER FLEXIVA 550 (UROLOGICAL SUPPLIES) IMPLANT
FIBER LASER TRAC TIP (UROLOGICAL SUPPLIES) IMPLANT
GAUZE SPONGE 4X4 12PLY STRL (GAUZE/BANDAGES/DRESSINGS) ×3 IMPLANT
GLOVE BIO SURGEON STRL SZ8 (GLOVE) ×9 IMPLANT
GLOVE BIOGEL PI IND STRL 8 (GLOVE) IMPLANT
GLOVE BIOGEL PI INDICATOR 8 (GLOVE)
GOWN STRL REUS W/TWL LRG LVL3 (GOWN DISPOSABLE) ×6 IMPLANT
GOWN STRL REUS W/TWL XL LVL3 (GOWN DISPOSABLE) ×3 IMPLANT
GUIDEWIRE AMPLATZ STIFF 0.35 (WIRE) ×2 IMPLANT
GUIDEWIRE AMPLAZ .035X145 (WIRE) ×3 IMPLANT
GUIDEWIRE STR DUAL SENSOR (WIRE) ×6 IMPLANT
INSERT SILICONE FOR G14464 (MISCELLANEOUS) ×3 IMPLANT
IV NS IRRIG 3000ML ARTHROMATIC (IV SOLUTION) ×5 IMPLANT
KIT BASIN OR (CUSTOM PROCEDURE TRAY) ×3 IMPLANT
MANIFOLD NEPTUNE II (INSTRUMENTS) ×3 IMPLANT
NDL TROCAR 18X15 ECHO (NEEDLE) IMPLANT
NDL TROCAR 18X20 (NEEDLE) IMPLANT
NEEDLE TROCAR 18X15 ECHO (NEEDLE) IMPLANT
NEEDLE TROCAR 18X20 (NEEDLE) IMPLANT
NS IRRIG 1000ML POUR BTL (IV SOLUTION) ×3 IMPLANT
PACK CYSTO (CUSTOM PROCEDURE TRAY) ×3 IMPLANT
PAD ABD 8X10 STRL (GAUZE/BANDAGES/DRESSINGS) ×2 IMPLANT
PROBE LITHOCLAST ULTRA 3.8X403 (UROLOGICAL SUPPLIES) IMPLANT
PROBE PNEUMATIC 1.0MMX570MM (UROLOGICAL SUPPLIES) IMPLANT
SET AMPLATZ RENAL DILATOR (MISCELLANEOUS) IMPLANT
SET IRRIG Y TYPE TUR BLADDER L (SET/KITS/TRAYS/PACK) ×6 IMPLANT
SHEATH PEELAWAY SET 9 (SHEATH) IMPLANT
SPONGE LAP 4X18 X RAY DECT (DISPOSABLE) ×3 IMPLANT
STENT CONTOUR 6FRX26X.038 (STENTS) IMPLANT
STENT URET 6FRX24 CONTOUR (STENTS) ×1 IMPLANT
STONE CATCHER W/TUBE ADAPTER (UROLOGICAL SUPPLIES) IMPLANT
SUT SILK 2 0 30  PSL (SUTURE) ×1
SUT SILK 2 0 30 PSL (SUTURE) ×2 IMPLANT
SUT SILK 3 0 SH 30 (SUTURE) ×1 IMPLANT
SYR 20CC LL (SYRINGE) ×6 IMPLANT
SYRINGE 10CC LL (SYRINGE) ×3 IMPLANT
TAPE STRIPS DRAPE STRL (GAUZE/BANDAGES/DRESSINGS) ×1 IMPLANT
TOWEL OR 17X26 10 PK STRL BLUE (TOWEL DISPOSABLE) ×3 IMPLANT
TUBING CONNECTING 10 (TUBING) ×6 IMPLANT
WATER STERILE IRR 1500ML POUR (IV SOLUTION) ×3 IMPLANT

## 2016-01-09 NOTE — Interval H&P Note (Signed)
History and Physical Interval Note:  01/09/2016 12:08 PM  Leah [name]  has presented today for surgery, with the diagnosis of RIGHT RENAL STONES  The various methods of treatment have been discussed with the patient and family. After consideration of risks, benefits and other options for treatment, the patient has consented to  Procedure(s): RIGHT PERCUTANEOUS NEPHROLITHOTOMY SECOND LOOK , POSSIBLE DILATION OF TRACT (Right) CYSTOSCOPY WITH STENT PLACEMENT (Right) HOLMIUM LASER APPLICATION (Right) as a surgical intervention .  The patient's history has been reviewed, patient examined, no change in status, stable for surgery.  I have reviewed the patient's chart and labs.  Questions were answered to the patient's satisfaction.     [name] L

## 2016-01-09 NOTE — Anesthesia Preprocedure Evaluation (Addendum)
Anesthesia Evaluation  Patient identified by MRN, date of birth, ID band Patient awake    Reviewed: Allergy & Precautions, NPO status , Patient's Chart, lab work & pertinent test results  Airway Mallampati: II  TM Distance: >3 FB Neck ROM: Full    Dental no notable dental hx.    Pulmonary Current Smoker,    Pulmonary exam normal breath sounds clear to auscultation       Cardiovascular hypertension, Pt. on medications and Pt. on home beta blockers + CAD and + Past MI  Normal cardiovascular exam Rhythm:Regular Rate:Normal     Neuro/Psych PSYCHIATRIC DISORDERS negative neurological ROS     GI/Hepatic negative GI ROS, Neg liver ROS,   Endo/Other  diabetes  Renal/GU Renal disease  negative genitourinary   Musculoskeletal  (+) Arthritis ,   Abdominal   Peds negative pediatric ROS (+)  Hematology negative hematology ROS (+)   Anesthesia Other Findings   Reproductive/Obstetrics negative OB ROS                            Anesthesia Physical Anesthesia Plan  ASA: III  Anesthesia Plan: General   Post-op Pain Management:    Induction: Intravenous  Airway Management Planned: Oral ETT  Additional Equipment:   Intra-op Plan:   Post-operative Plan: Extubation in OR  Informed Consent: I have reviewed the patients History and Physical, chart, labs and discussed the procedure including the risks, benefits and alternatives for the proposed anesthesia with the patient or authorized representative who has indicated his/her understanding and acceptance.   Dental advisory given  Plan Discussed with: CRNA  Anesthesia Plan Comments:        Anesthesia Quick Evaluation

## 2016-01-09 NOTE — Anesthesia Procedure Notes (Signed)
Procedure Name: Intubation Date/Time: 01/09/2016 12:25 PM Performed by: West Pugh Pre-anesthesia Checklist: Patient identified, Emergency Drugs available, Suction available, Patient being monitored and Timeout performed Patient Re-evaluated:Patient Re-evaluated prior to inductionOxygen Delivery Method: Circle system utilized Preoxygenation: Pre-oxygenation with 100% oxygen Intubation Type: IV induction Ventilation: Mask ventilation without difficulty Laryngoscope Size: Mac and 3 Grade View: Grade II Tube type: Oral Tube size: 7.0 mm Number of attempts: 1 Airway Equipment and Method: Stylet Placement Confirmation: ETT inserted through vocal cords under direct vision,  positive ETCO2,  CO2 detector and breath sounds checked- equal and bilateral Secured at: 20 cm Tube secured with: Tape Dental Injury: Teeth and Oropharynx as per pre-operative assessment

## 2016-01-09 NOTE — H&P (View-Only) (Signed)
Urology Admission H&P  Chief Complaint: right flank pain  History of Present Illness: Leah [name] is a 60yo with a hx of right abdominal pain who underwent CT scan and was found to have a large right renal pelvis stone burden. Currently she has dull intermittent nonradiating right abdominal pain. She denies LUTS. No gross hematuria  Past Medical History  Diagnosis Date  . Hypertension   . Myocardial infarction ([place]) 1996     HAD ANGIOPLASTY WITH 2 STENTS  . Hyperlipidemia   . Arthritis   . History of shingles   . History of kidney stones   . Borderline diabetes   . Diabetes mellitus without complication ([place])     no meds   Past Surgical History  Procedure Laterality Date  . Breast lumpectomy  >10 YRS AGO    RT BREAST  . Coronary angioplasty  1996    ANGIOPLASTY / 2 STENTS  . Lithotripsy      Home Medications:  Prescriptions prior to admission  Medication Sig Dispense Refill Last Dose  . amLODipine (NORVASC) 10 MG tablet Take 10 mg by mouth daily.   12/30/2015 at 0730  . aspirin EC 81 MG tablet Take 81 mg by mouth every morning.    12/27/2015  . atorvastatin (LIPITOR) 80 MG tablet Take 80 mg by mouth every morning.   12/30/2015 at 0730  . Cholecalciferol (VITAMIN D-3) 1000 UNITS CAPS Take 1,000 Units by mouth 2 (two) times daily.    2 wk  . folic acid (FOLVITE) Q000111Q MCG tablet Take 1 mcg by mouth every morning.   2 wk  . ibuprofen (ADVIL) 200 MG tablet Take 200 mg by mouth every 6 (six) hours as needed for headache or moderate pain.   12/28/2015  . lisinopril (PRINIVIL,ZESTRIL) 10 MG tablet Take 10 mg by mouth every morning.   12/30/2015 at 0730  . metoprolol succinate (TOPROL-XL) 50 MG 24 hr tablet Take 50 mg by mouth daily. Take with or immediately following a meal.   12/30/2015 at 0730  . Multiple Vitamin (MULTIVITAMIN WITH MINERALS) TABS tablet Take 1 tablet by mouth every morning.   2 wk  . Omega-3 Fatty Acids (OMEGA 3 500 PO) Take 500 mg by mouth every morning.   2 wk  . Selenium  200 MCG TABS Take 200 mcg by mouth every morning.   2 wk   Allergies:  Allergies  Allergen Reactions  . Codeine     REACTION: " Makes me high and balcked out in the 1980s"    Family History  Problem Relation Age of [place]  . Heart disease Mother   . Diabetes Mother   . Cancer Father     bone  . Stroke Maternal Grandmother    Social History:  reports that she has been smoking Cigarettes.  She has been smoking about 1.00 pack per day. She has never used smokeless tobacco. She reports that she does not drink alcohol or use illicit drugs.  Review of Systems  Genitourinary: Positive for flank pain.  All other systems reviewed and are negative.   Physical Exam:  Vital signs in last 24 hours: Temp:  [98.3 F (36.8 C)] 98.3 F (36.8 C) (02/13 0909) Pulse Rate:  [84] 84 (02/13 0909) Resp:  [18] 18 (02/13 0909) BP: (152)/(75) 152/75 mmHg (02/13 0909) SpO2:  [100 %] 100 % (02/13 0909) Weight:  [82.555 kg (182 lb)] 82.555 kg (182 lb) (02/13 0941) Physical Exam  Constitutional: She is oriented to person, place, and  time. She appears well-developed and well-nourished.  HENT:  Head: Normocephalic and atraumatic.  Eyes: EOM are normal. Pupils are equal, round, and reactive to light.  Neck: Normal range of motion. No thyromegaly present.  Cardiovascular: Normal rate and regular rhythm.   Respiratory: Effort normal. No respiratory distress.  GI: Soft. She exhibits no distension and no mass. There is no tenderness. There is no rebound and no guarding.  Musculoskeletal: Normal range of motion.  Neurological: She is alert and oriented to person, place, and time.  Skin: Skin is warm and dry.  Psychiatric: She has a normal mood and affect. Her behavior is normal. Judgment and thought content normal.    Laboratory Data:  Results for orders placed or performed during the hospital encounter of 12/30/15 (from the past 24 hour(s))  Glucose, capillary     Status: Abnormal   Collection Time:  12/30/15  9:42 AM  Result Value Ref Range   Glucose-Capillary 106 (H) 65 - 99 mg/dL   No results found for this or any previous visit (from the past 240 hour(s)). Creatinine:  Recent Labs  12/27/15 1150  CREATININE 0.88   Baseline Creatinine: 0.9  Impression/Assessment:  59yo with right renal calculi  Plan:  The risks/benefits/alternatives to R PCNL was explained to the patient and she understands and wishes to proceed with surgery  Marah Park L 12/30/2015, 11:44 AM

## 2016-01-09 NOTE — Transfer of Care (Signed)
Immediate Anesthesia Transfer of Care Note  Patient: Leah [name]  Procedure(s) Performed: Procedure(s): RIGHT PERCUTANEOUS NEPHROLITHOTOMY SECOND LOOK , POSSIBLE DILATION OF TRACT (Right) CYSTOSCOPY WITH STENT PLACEMENT (Right) HOLMIUM LASER APPLICATION (Right)  Patient Location: PACU  Anesthesia Type:General  Level of Consciousness:  sedated, patient cooperative and responds to stimulation  Airway & Oxygen Therapy:Patient Spontanous Breathing and Patient connected to face mask oxgen  Post-op Assessment:  Report given to PACU RN and Post -op Vital signs reviewed and stable  Post vital signs:  Reviewed and stable  Last Vitals:  Filed Vitals:   01/09/16 1024 01/09/16 1508  BP: 152/75   Pulse: 94 85  Temp: 36.6 C   Resp: 18 13    Complications: No apparent anesthesia complications

## 2016-01-09 NOTE — Brief Op Note (Signed)
01/09/2016  3:08 PM  PATIENT:  Leah [name]  60 y.o. female  PRE-OPERATIVE DIAGNOSIS:  RIGHT RENAL STONES  POST-OPERATIVE DIAGNOSIS:  RIGHT RENAL STONES  PROCEDURE:  Procedure(s): RIGHT PERCUTANEOUS NEPHROLITHOTOMY SECOND LOOK , POSSIBLE DILATION OF TRACT (Right) CYSTOSCOPY WITH STENT PLACEMENT (Right) HOLMIUM LASER APPLICATION (Right)  SURGEON:  Surgeon(s) and Role:    * Cleon Gustin, MD - Primary  PHYSICIAN ASSISTANT:   ASSISTANTS: none   ANESTHESIA:   general  EBL:  Total I/O In: 1000 [I.V.:1000] Out: 200 [Urine:200]  BLOOD ADMINISTERED:none  DRAINS: 1. Foley catheter 2. Right 6x26 JJ ureteral stent 3. Right 16 french nephrostomy tube  LOCAL MEDICATIONS USED:  NONE  SPECIMEN:  Source of Specimen:  right renal stone  DISPOSITION OF SPECIMEN:  N/A  COUNTS:  YES  TOURNIQUET:  * No tourniquets in log *  DICTATION: .Note written in EPIC  PLAN OF CARE: Admit for overnight observation  PATIENT DISPOSITION:  PACU - hemodynamically stable.   Delay start of Pharmacological VTE agent (>24hrs) due to surgical blood loss or risk of bleeding: no

## 2016-01-09 NOTE — Anesthesia Postprocedure Evaluation (Signed)
Anesthesia Post Note  Patient: Leah [name]  Procedure(s) Performed: Procedure(s) (LRB): RIGHT PERCUTANEOUS NEPHROLITHOTOMY SECOND LOOK , POSSIBLE DILATION OF TRACT (Right) CYSTOSCOPY WITH STENT PLACEMENT (Right) HOLMIUM LASER APPLICATION (Right)  Patient location during evaluation: PACU Anesthesia Type: General Level of consciousness: awake and alert Pain management: pain level controlled Vital Signs Assessment: post-procedure vital signs reviewed and stable Respiratory status: spontaneous breathing, nonlabored ventilation, respiratory function stable and patient connected to nasal cannula oxygen Cardiovascular status: blood pressure returned to baseline and stable Postop Assessment: no signs of nausea or vomiting Anesthetic complications: no    Last Vitals:  Filed Vitals:   01/09/16 1530 01/09/16 1545  BP: 138/69 126/81  Pulse: 78 73  Temp:    Resp: 13 15    Last Pain:  Filed Vitals:   01/09/16 1549  PainSc: 0-No pain                 Taite Baldassari J

## 2016-01-10 ENCOUNTER — Encounter (HOSPITAL_COMMUNITY): Payer: Self-pay | Admitting: Urology

## 2016-01-10 DIAGNOSIS — N200 Calculus of kidney: Secondary | ICD-10-CM | POA: Diagnosis not present

## 2016-01-10 LAB — CBC
HEMATOCRIT: 31.8 % — AB (ref 36.0–46.0)
Hemoglobin: 10.4 g/dL — ABNORMAL LOW (ref 12.0–15.0)
MCH: 29.5 pg (ref 26.0–34.0)
MCHC: 32.7 g/dL (ref 30.0–36.0)
MCV: 90.1 fL (ref 78.0–100.0)
PLATELETS: 299 10*3/uL (ref 150–400)
RBC: 3.53 MIL/uL — ABNORMAL LOW (ref 3.87–5.11)
RDW: 14.5 % (ref 11.5–15.5)
WBC: 13.3 10*3/uL — AB (ref 4.0–10.5)

## 2016-01-10 LAB — BASIC METABOLIC PANEL
Anion gap: 9 (ref 5–15)
BUN: 8 mg/dL (ref 6–20)
CHLORIDE: 105 mmol/L (ref 101–111)
CO2: 24 mmol/L (ref 22–32)
CREATININE: 0.71 mg/dL (ref 0.44–1.00)
Calcium: 8.2 mg/dL — ABNORMAL LOW (ref 8.9–10.3)
GFR calc non Af Amer: >60 mL/min (ref >60)
Glucose, Bld: 163 mg/dL — ABNORMAL HIGH (ref 65–99)
Potassium: 3.9 mmol/L (ref 3.5–5.1)
Sodium: 138 mmol/L (ref 135–145)

## 2016-01-10 NOTE — Progress Notes (Signed)
At initial assessment of nephrostomy tube last night, dressing was noted to have some drainage present. This am however dressing is completely saturated, as is the linens. Draining was so prolific that dressing couldn't not be reinforced and was changed. New dressing includes 2 split gauze, 4x4 gauze, abdominal pad and large tegaderm. Will continue to monitor pt's condition.

## 2016-01-10 NOTE — Care Management Note (Signed)
Case Management Note  Patient Details  Name: Leah [name] MRN: [id number] Date of Birth: Feb 24, 1956  Subjective/Objective: 61 y/o f admitted w/renal calculi. From home.                   Action/Plan:d/c home no needs or orders.   Expected Discharge Date:                  Expected Discharge Plan:  Home/Self Care  In-House Referral:     Discharge planning Services  CM Consult  Post Acute Care Choice:    Choice offered to:     DME Arranged:    DME Agency:     HH Arranged:    [place] Agency:     Status of Service:  Completed, signed off  Medicare Important Message Given:    Date Medicare IM Given:    Medicare IM give by:    Date Additional Medicare IM Given:    Additional Medicare Important Message give by:     If discussed at [place] of Stay Meetings, dates discussed:    Additional Comments:  Dessa Phi, RN 01/10/2016, 10:11 AM

## 2016-01-10 NOTE — Progress Notes (Signed)
Patient is alert and oriented x4, ambulatory.Discharge instructions reviewed. Questions, concerns denied. Pt discharged with nephrostomy tube. Bag and tubing provided by IR tech.

## 2016-01-10 NOTE — Discharge Instructions (Signed)
Percutaneous Nephrostomy, Care After °Refer to this sheet in the next few weeks. These instructions provide you with information on caring for yourself after your procedure. Your health care provider may also give you more specific instructions. Your treatment has been planned according to current medical practices, but problems sometimes occur. Call your health care provider if you have any problems or questions after your procedure. °WHAT TO EXPECT AFTER THE PROCEDURE °You will need to remain lying down for several hours. °HOME CARE INSTRUCTIONS °· Your nephrostomy tube is connected to a leg bag or bedside drainage bag. Always keep the tubing, the leg bag, or the bedside drainage bags below the level of the kidney so that the urine drains freely. °· During the day, if you are connecting the nephrostomy tube to a leg bag, be sure there are no kinks in the tubing and that the urine is draining freely. °· At night, you may want to connect the nephrostomy tube or the leg bag to a larger bedside drainage bag. °· Change the dressing as often as directed by your health care provider, or if it becomes wet. °¨ Gently remove the tapes and dressing from around the nephrostomy tube. Be careful not to pull on the tube while removing the dressing. °¨ Wash the skin around the tube, rinse well, and dry. °¨ Place two split drain sponges in and around the tube exit site. °¨ Place tape around edge of the dressing. °¨ Secure the nephrostomy tubing. Remember to make certain that the nephrostomy tube does not kink or become pinched closed. It can be useful to wrap any exposed tubing going from the nephrostomy tube to any of the connecting tubes to either the leg bag or drainage bag with an elastic bandage. °· Every three weeks, replace the leg bag, drainage bag, and any extension tubing connected to your nephrostomy tube. Your health care provider will explain how to change the drainage bag and extension tubing. °SEEK MEDICAL CARE  IF: °· You experience any problems with any of the valves or tubing. °· You have persistent pain or soreness in your back. °· You have a fever or chills. °SEEK IMMEDIATE MEDICAL CARE IF: °· You have abdominal pain during the first week. °· You have a new appearance of blood in your urine. °· You have back pain that is not relieved by your medicine. °· You have drainage, redness, swelling, or pain at the tube insertion site. °· You have decreased urine output. °· Your nephrostomy tube comes out. °  °This information is not intended to replace advice given to you by your health care provider. Make sure you discuss any questions you have with your health care provider. °  °Document Released: 06/25/2004 Document Revised: 11/23/2014 Document Reviewed: 06/29/2013 °Elsevier Interactive Patient Education ©2016 [employer] ° °

## 2016-01-10 NOTE — Progress Notes (Signed)
Foley catheter was discontinued at 1015. Pt was able to void successfully 300 cc out

## 2016-01-13 NOTE — Op Note (Signed)
Preoperative diagnosis: Right renal stone  Postoperative diagnosis: Same  Procedure 1.  Right percutaneous nephrostolithotomy for stone greater than 2 cm 2.  Right nephrostogram 3.  Intraoperative fluoroscopy, under 1 hour, with interpretation 4.  Placement of a 6 x 26 double-J ureteral stent. 5.  Placement of a 80 French nephrostomy tube 6.  Dilation of percutaneous tract  Attending: Nicolette Bang, MD  Anesthesia: General  Estimated blood loss: Minimal  Antibiotics: Rocephin  Drains: 1.  16 French Foley catheter 2.  6 x 26 right double-J ureteral stent 3.  18 French nephrostomy tube  Specimens: Stone for analysis  Findings: numerous renal pelvis calculi. 2cm UPJ calculus. Limited drainage of contrast down the ureter prior to removing UPJ calculus. Minimal extravasation following PCNL  Indications: Patient is a 60 year old female with a history of large right renal stones.  After discussing treatment options and decided she was right percutaneous nephrostolithotomy.  Patient already has a nephrostomy tube.  Procedure in detail: Prior to procedure consent was obtained.  Patient was brought to the operating room debridement was done to ensure correct patient, correct procedure, and correct site.  General anesthesia was administered.  A 16 French Foley catheter was in place.  The patient was then placed in the prone position.  His nephrostomy tube and right flank was then prepped and draped in usual sterile fashion.  A nephrostogram was obtained and findings noted above.  Through the nephrostomy tube we then placed a sensor wire.  Sensor wire was coiled in the renal pelvis we then removed the nephrostomy tube.  We then made an incision at the level of the skin and over the wire we then placed a NephroMax dilator.  We dilated the nephrostomy tract to 30 [place] and held this 18 cm of water for 1 minute.  We then  placed the access sheath over the balloon.  The balloon was then deflated.   We then used a rigid nephroscope to perform nephroscopy.  We encountered numerous large renal pelvis calculi and large UPJ calculus.  We then used a lithoclast to fragment the stone in multiple pieces.  Using the graspers we removed stone fragments and sent for composition analysis.  Once the majority of the stone was removed we then were able to perform nephroscopy with a flexible nephroscope.   We then placed a second wire through the ureteroscope into the bladder.  We then removed the nephroscope and over the wire placed a 6 x 26 double-J ureteral stent.  The wire was then removed and good coil was noted in the renal pelvis under direct vision in the bladder under fluoroscopy.  We then placed a 20 French nephrostomy tube through the sheath into the renal pelvis.  The balloon was inflated with 3 amounts of contrast.  We then removed the access sheath in and obtain another nephrostogram.  We noted minimal extravasation of contrast.  We then secured the nephrostomy tubes with 0 silks in interrupted fashion.  Dressing was placed over the nephrostomy tube site and this then concluded the procedure was well-tolerated by the patient.  Complications: None  Condition: Stable, extubated, transferred to PACU  Plan: Patient is to be admitted overnight for observation.  Her Foley catheter through the morning. Nephrostomy tube will be removed in 2 days. She is then to be discharged home and followup in 1 week for stent removal

## 2016-01-14 NOTE — Discharge Summary (Signed)
Physician Discharge Summary  Patient ID: Leah [name] MRN: [id number] DOB/AGE: 1956/01/01 60 y.o.  Admit date: 01/09/2016 Discharge date: 01/10/2016  Admission Diagnoses: Renal calculi  Discharge Diagnoses:  Active Problems:   Renal calculi   Discharged Condition: good  Hospital Course: The patient tolerated the procedure well and was transferred to the floor on IV pain meds, IV fluid. On POD#1 foley was removed, pt was started on a regular diet and they ambulated in the halls. Prior to discharge the pt was tolerating a regular diet, pain was controlled on PO pain meds, they were ambulating without difficulty, and they had normal bowel function.   Consults: None  Significant Diagnostic Studies: none  Treatments: surgery: R PCNL  Discharge Exam: Blood pressure 172/60, pulse 68, temperature 97.9 F (36.6 C), temperature source Oral, resp. rate 20, height 5\' 3"  (1.6 m), weight 79.379 kg (175 lb), SpO2 99 %. General appearance: alert, cooperative and appears stated age Head: Normocephalic, without obvious abnormality, atraumatic Eyes: conjunctivae/corneas clear. PERRL, EOM's intact. Fundi benign. Resp: clear to auscultation bilaterally Cardio: regular rate and rhythm, S1, S2 normal, no murmur, click, rub or gallop GI: soft, non-tender; bowel sounds normal; no masses,  no organomegaly Extremities: extremities normal, atraumatic, no cyanosis or edema Neurologic: Grossly normal  Disposition: 01-Home or Self Care     Medication List    TAKE these medications        amLODipine 2.5 MG tablet  Commonly known as:  NORVASC  Take 2.5 mg by mouth daily.     aspirin EC 81 MG tablet  Take 81 mg by mouth every morning.     folic acid Q000111Q MCG tablet  Commonly known as:  FOLVITE  Take 1 mcg by mouth every morning.     LIPITOR 80 MG tablet  Generic drug:  atorvastatin  Take 80 mg by mouth every morning.     lisinopril 10 MG tablet  Commonly known as:  PRINIVIL,ZESTRIL  Take  10 mg by mouth every morning.     metoprolol succinate 50 MG 24 hr tablet  Commonly known as:  TOPROL-XL  Take 50 mg by mouth daily. Take with or immediately following a meal.     multivitamin with minerals Tabs tablet  Take 1 tablet by mouth every morning.     OMEGA 3 500 PO  Take 500 mg by mouth every morning.     oxyCODONE-acetaminophen 5-325 MG tablet  Commonly known as:  PERCOCET/ROXICET  Take 1-2 tablets by mouth every 4 (four) hours as needed for moderate pain.     Selenium 200 MCG Tabs  Take 200 mcg by mouth every morning.     Vitamin D-3 1000 units Caps  Take 1,000 Units by mouth 2 (two) times daily.           Follow-up Information    Follow up with Cleon Gustin, MD. Call on 01/15/2016.   Specialty:  Urology   Why:  nephrostomy tube removal   Contact information:   [address] [place] [address] [phone]       Signed: Cleon Gustin 01/14/2016, 1:10 PM

## 2016-03-06 ENCOUNTER — Other Ambulatory Visit: Payer: Self-pay | Admitting: Urology

## 2016-04-21 ENCOUNTER — Other Ambulatory Visit: Payer: Self-pay | Admitting: Urology

## 2016-04-21 DIAGNOSIS — N200 Calculus of kidney: Secondary | ICD-10-CM

## 2016-05-04 ENCOUNTER — Encounter (HOSPITAL_BASED_OUTPATIENT_CLINIC_OR_DEPARTMENT_OTHER): Payer: Self-pay

## 2016-05-04 ENCOUNTER — Ambulatory Visit (HOSPITAL_BASED_OUTPATIENT_CLINIC_OR_DEPARTMENT_OTHER): Admit: 2016-05-04 | Payer: BLUE CROSS/BLUE SHIELD | Admitting: Urology

## 2016-05-04 SURGERY — CYSTOSCOPY, WITH CALCULUS MANIPULATION OR REMOVAL
Anesthesia: General | Laterality: Right

## 2016-05-27 ENCOUNTER — Other Ambulatory Visit (HOSPITAL_COMMUNITY): Payer: Self-pay | Admitting: Internal Medicine

## 2016-05-27 DIAGNOSIS — Z1231 Encounter for screening mammogram for malignant neoplasm of breast: Secondary | ICD-10-CM

## 2016-06-03 ENCOUNTER — Ambulatory Visit (HOSPITAL_COMMUNITY)
Admission: RE | Admit: 2016-06-03 | Discharge: 2016-06-03 | Disposition: A | Discharge status: Home / Self Care | Payer: BLUE CROSS/BLUE SHIELD | Source: Ambulatory Visit | Attending: Internal Medicine | Admitting: Internal Medicine

## 2016-06-03 DIAGNOSIS — Z1231 Encounter for screening mammogram for malignant neoplasm of breast: Secondary | ICD-10-CM | POA: Diagnosis present

## 2016-06-22 ENCOUNTER — Ambulatory Visit (HOSPITAL_COMMUNITY)
Admission: RE | Admit: 2016-06-22 | Discharge: 2016-06-22 | Disposition: A | Discharge status: Home / Self Care | Payer: BLUE CROSS/BLUE SHIELD | Source: Ambulatory Visit | Attending: Urology | Admitting: Urology

## 2016-06-22 DIAGNOSIS — R935 Abnormal findings on diagnostic imaging of other abdominal regions, including retroperitoneum: Secondary | ICD-10-CM | POA: Insufficient documentation

## 2016-06-22 DIAGNOSIS — N200 Calculus of kidney: Secondary | ICD-10-CM | POA: Diagnosis not present

## 2016-07-01 ENCOUNTER — Ambulatory Visit (INDEPENDENT_AMBULATORY_CARE_PROVIDER_SITE_OTHER): Payer: BLUE CROSS/BLUE SHIELD | Admitting: Urology

## 2016-07-01 DIAGNOSIS — N200 Calculus of kidney: Secondary | ICD-10-CM

## 2016-10-26 ENCOUNTER — Other Ambulatory Visit (HOSPITAL_COMMUNITY): Payer: Self-pay | Admitting: Internal Medicine

## 2016-10-26 DIAGNOSIS — E2839 Other primary ovarian failure: Secondary | ICD-10-CM

## 2016-10-26 DIAGNOSIS — Z780 Asymptomatic menopausal state: Secondary | ICD-10-CM

## 2016-11-23 ENCOUNTER — Other Ambulatory Visit (HOSPITAL_COMMUNITY): Payer: BLUE CROSS/BLUE SHIELD

## 2016-11-26 ENCOUNTER — Ambulatory Visit (HOSPITAL_COMMUNITY)
Admission: RE | Admit: 2016-11-26 | Discharge: 2016-11-26 | Disposition: A | Discharge status: Home / Self Care | Payer: BLUE CROSS/BLUE SHIELD | Source: Ambulatory Visit | Attending: Internal Medicine | Admitting: Internal Medicine

## 2016-11-26 DIAGNOSIS — M85832 Other specified disorders of bone density and structure, left forearm: Secondary | ICD-10-CM | POA: Insufficient documentation

## 2016-11-26 DIAGNOSIS — M85852 Other specified disorders of bone density and structure, left thigh: Secondary | ICD-10-CM | POA: Diagnosis not present

## 2016-11-26 DIAGNOSIS — Z720 Tobacco use: Secondary | ICD-10-CM | POA: Diagnosis not present

## 2016-11-26 DIAGNOSIS — E2839 Other primary ovarian failure: Secondary | ICD-10-CM | POA: Diagnosis present

## 2016-11-26 DIAGNOSIS — Z780 Asymptomatic menopausal state: Secondary | ICD-10-CM | POA: Diagnosis not present

## 2016-12-18 IMAGING — RF DG ABDOMEN 1V
1 series · 2 of 2 positions shown · non-contrast
Comparison: 12/30/2015

CLINICAL DATA: Right renal stone removal and right ureteral stent
placement.

EXAM:
ABDOMEN - 1 VIEW; DG C-ARM GT 120 MIN-NO REPORT; DG C-ARM 61-120
MIN-NO REPORT

[Series 1: run · 2 of 2 slices shown]
[im 1/2]
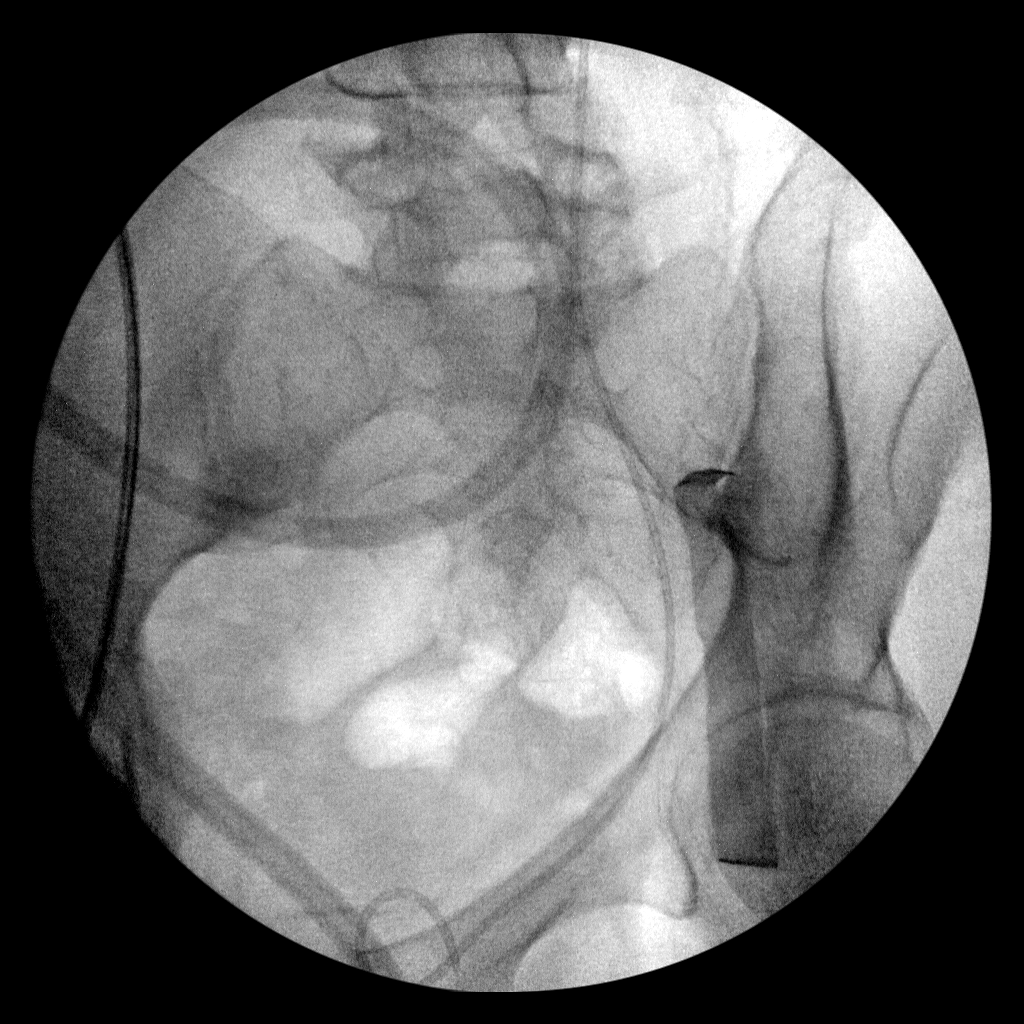
[im 2/2]
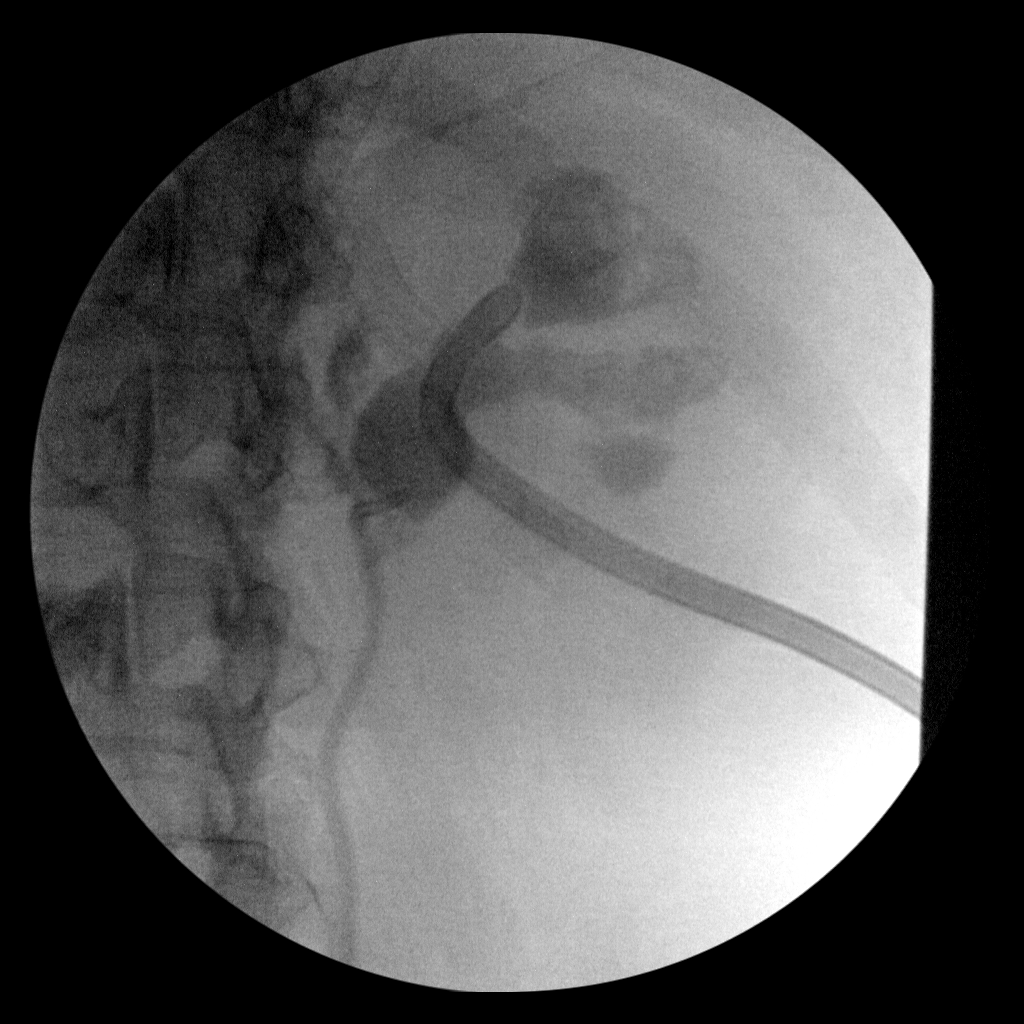

[2 of 2 positions shown; findings below may reference images not displayed]

FINDINGS: Two intraoperative views of the abdomen/pelvis are submitted
postoperatively for interpretation.

The first image demonstrates a nephrostomy tube and proximal aspect
of a ureteral stent with tip near the UPJ. Contrast within a dilated
renal collecting system noted with filling defects compatible with
right renal calculi.

The second film demonstrates the distal aspect of the ureteral stent
with tip overlying the bladder.
IMPRESSION: As above.

## 2016-12-29 ENCOUNTER — Other Ambulatory Visit: Payer: Self-pay | Admitting: Urology

## 2016-12-29 DIAGNOSIS — N200 Calculus of kidney: Secondary | ICD-10-CM

## 2017-01-15 ENCOUNTER — Ambulatory Visit (HOSPITAL_COMMUNITY)
Admission: RE | Admit: 2017-01-15 | Discharge: 2017-01-15 | Disposition: A | Discharge status: Home / Self Care | Payer: BLUE CROSS/BLUE SHIELD | Source: Ambulatory Visit | Attending: Urology | Admitting: Urology

## 2017-01-15 DIAGNOSIS — D1803 Hemangioma of intra-abdominal structures: Secondary | ICD-10-CM | POA: Insufficient documentation

## 2017-01-15 DIAGNOSIS — N200 Calculus of kidney: Secondary | ICD-10-CM | POA: Insufficient documentation

## 2017-01-20 ENCOUNTER — Ambulatory Visit (INDEPENDENT_AMBULATORY_CARE_PROVIDER_SITE_OTHER): Payer: BLUE CROSS/BLUE SHIELD | Admitting: Urology

## 2017-01-20 DIAGNOSIS — N200 Calculus of kidney: Secondary | ICD-10-CM

## 2017-04-22 ENCOUNTER — Other Ambulatory Visit (HOSPITAL_COMMUNITY): Payer: Self-pay | Admitting: Internal Medicine

## 2017-04-22 DIAGNOSIS — Z1231 Encounter for screening mammogram for malignant neoplasm of breast: Secondary | ICD-10-CM

## 2017-05-11 ENCOUNTER — Ambulatory Visit (INDEPENDENT_AMBULATORY_CARE_PROVIDER_SITE_OTHER): Payer: BLUE CROSS/BLUE SHIELD | Admitting: Obstetrics & Gynecology

## 2017-05-11 ENCOUNTER — Other Ambulatory Visit (HOSPITAL_COMMUNITY)
Admission: RE | Admit: 2017-05-11 | Discharge: 2017-05-11 | Disposition: A | Discharge status: Home / Self Care | Payer: BLUE CROSS/BLUE SHIELD | Source: Ambulatory Visit | Attending: Obstetrics & Gynecology | Admitting: Obstetrics & Gynecology

## 2017-05-11 ENCOUNTER — Encounter: Payer: Self-pay | Admitting: Obstetrics & Gynecology

## 2017-05-11 VITALS — BP 112/80 | HR 76 | Ht 63.0 in | Wt 180.0 lb

## 2017-05-11 DIAGNOSIS — Z01419 Encounter for gynecological examination (general) (routine) without abnormal findings: Secondary | ICD-10-CM | POA: Diagnosis not present

## 2017-05-11 DIAGNOSIS — Z1212 Encounter for screening for malignant neoplasm of rectum: Secondary | ICD-10-CM

## 2017-05-11 DIAGNOSIS — Z1211 Encounter for screening for malignant neoplasm of colon: Secondary | ICD-10-CM

## 2017-05-11 NOTE — Progress Notes (Signed)
Subjective:     Leah [name] is a 61 y.o. female here for a routine exam.  No LMP recorded. Patient is postmenopausal. G1P1 Birth Control Method:  menopausae Menstrual Calendar(currently): amenorrheic  Current complaints: none.   Current acute medical issues:  none   Recent Gynecologic History No LMP recorded. Patient is postmenopausal. Last Pap: 04/2015,  normal Last mammogram: 06/03/2016,  normal  Past Medical History:  Diagnosis Date  . Arthritis   . Borderline diabetes   . Diabetes mellitus without complication (HCC)    no meds  . History of kidney stones   . History of shingles   . Hyperlipidemia   . Hypertension   . Myocardial infarction ([place]) 1996    HAD ANGIOPLASTY WITH 2 STENTS  . Tinnitus    right ear   . Wears glasses     Past Surgical History:  Procedure Laterality Date  . BREAST LUMPECTOMY  >10 YRS AGO   RT BREAST  . CORONARY ANGIOPLASTY  1996   ANGIOPLASTY / 2 STENTS  . CYSTOSCOPY WITH STENT PLACEMENT Right 12/30/2015   Procedure: CYSTOSCOPY;  Surgeon: Cleon Gustin, MD;  Location: [facility];  Service: Urology;  Laterality: Right;  . CYSTOSCOPY WITH STENT PLACEMENT Right 01/09/2016   Procedure: CYSTOSCOPY WITH STENT PLACEMENT;  Surgeon: Cleon Gustin, MD;  Location: [facility];  Service: Urology;  Laterality: Right;  . HOLMIUM LASER APPLICATION Right 7/85/8850   Procedure: HOLMIUM LASER APPLICATION;  Surgeon: Cleon Gustin, MD;  Location: [facility];  Service: Urology;  Laterality: Right;  . LITHOTRIPSY    . NEPHROLITHOTOMY Right 12/30/2015   Procedure: RIght Nephrostomy with access;  Surgeon: Cleon Gustin, MD;  Location: [facility];  Service: Urology;  Laterality: Right;  . NEPHROLITHOTOMY Right 01/09/2016   Procedure: RIGHT PERCUTANEOUS NEPHROLITHOTOMY SECOND LOOK , POSSIBLE DILATION OF TRACT;  Surgeon: Cleon Gustin, MD;  Location: [facility];  Service: Urology;  Laterality: Right;    OB History    Gravida Para Term Preterm AB Living   1 1            SAB TAB Ectopic Multiple Live Births                  Social History   Social History  . Marital status: Widowed    Spouse name: N/A  . Number of children: N/A  . Years of education: N/A   Social History Main Topics  . Smoking status: Current Every Day Smoker    Packs/day: 1.00    Years: 40.00    Types: Cigarettes  . Smokeless tobacco: Never Used  . Alcohol use No  . Drug use: No  . Sexual activity: Not Asked   Other Topics Concern  . None   Social History Narrative  . None    Family History  Problem Relation Age of Onset  . Heart disease Mother   . Diabetes Mother   . Cancer Father        bone  . Stroke Maternal Grandmother      Current Outpatient Prescriptions:  .  amLODipine (NORVASC) 2.5 MG tablet, Take 2.5 mg by mouth daily., Disp: , Rfl:  .  aspirin EC 81 MG tablet, Take 81 mg by mouth every morning. , Disp: , Rfl:  .  atorvastatin (LIPITOR) 80 MG tablet, Take 80 mg by mouth every morning., Disp: , Rfl:  .  Cholecalciferol (VITAMIN D-3) 1000 UNITS CAPS, Take 1,000 Units by mouth 2 (two) times daily. ,  Disp: , Rfl:  .  lisinopril (PRINIVIL,ZESTRIL) 10 MG tablet, Take 10 mg by mouth every morning., Disp: , Rfl:  .  metoprolol succinate (TOPROL-XL) 50 MG 24 hr tablet, Take 50 mg by mouth daily. Take with or immediately following a meal., Disp: , Rfl:  .  Multiple Vitamin (MULTIVITAMIN WITH MINERALS) TABS tablet, Take 1 tablet by mouth every morning., Disp: , Rfl:  .  folic acid (FOLVITE) 852 MCG tablet, Take 1 mcg by mouth every morning., Disp: , Rfl:  .  Omega-3 Fatty Acids (OMEGA 3 500 PO), Take 500 mg by mouth every morning., Disp: , Rfl:  .  oxyCODONE-acetaminophen (PERCOCET/ROXICET) 5-325 MG tablet, Take 1-2 tablets by mouth every 4 (four) hours as needed for moderate pain. (Patient not taking: Reported on 05/11/2017), Disp: 30 tablet, Rfl: 0 .  Selenium 200 MCG TABS, Take 200 mcg by mouth every morning., Disp: , Rfl:   Review of Systems  Review of  Systems  Constitutional: Negative for fever, chills, weight loss, malaise/fatigue and diaphoresis.  HENT: Negative for hearing loss, ear pain, nosebleeds, congestion, sore throat, neck pain, tinnitus and ear discharge.   Eyes: Negative for blurred vision, double vision, photophobia, pain, discharge and redness.  Respiratory: Negative for cough, hemoptysis, sputum production, shortness of breath, wheezing and stridor.   Cardiovascular: Negative for chest pain, palpitations, orthopnea, claudication, leg swelling and PND.  Gastrointestinal: negative for abdominal pain. Negative for heartburn, nausea, vomiting, diarrhea, constipation, blood in stool and melena.  Genitourinary: Negative for dysuria, urgency, frequency, hematuria and flank pain.  Musculoskeletal: Negative for myalgias, back pain, joint pain and falls.  Skin: Negative for itching and rash.  Neurological: Negative for dizziness, tingling, tremors, sensory change, speech change, focal weakness, seizures, loss of consciousness, weakness and headaches.  Endo/Heme/Allergies: Negative for environmental allergies and polydipsia. Does not bruise/bleed easily.  Psychiatric/Behavioral: Negative for depression, suicidal ideas, hallucinations, memory loss and substance abuse. The patient is not nervous/anxious and does not have insomnia.        Objective:  Blood pressure 112/80, pulse 76, height 5\' 3"  (1.6 m), weight 180 lb (81.6 kg).   Physical Exam  Vitals reviewed. Constitutional: She is oriented to person, place, and time. She appears well-developed and well-nourished.  HENT:  Head: Normocephalic and atraumatic.        Right Ear: External ear normal.  Left Ear: External ear normal.  Nose: Nose normal.  Mouth/Throat: Oropharynx is clear and moist.  Eyes: Conjunctivae and EOM are normal. Pupils are equal, round, and reactive to light. Right eye exhibits no discharge. Left eye exhibits no discharge. No scleral icterus.  Neck: Normal range  of motion. Neck supple. No tracheal deviation present. No thyromegaly present.  Cardiovascular: Normal rate, regular rhythm, normal heart sounds and intact distal pulses.  Exam reveals no gallop and no friction rub.   No murmur heard. Respiratory: Effort normal and breath sounds normal. No respiratory distress. She has no wheezes. She has no rales. She exhibits no tenderness.  GI: Soft. Bowel sounds are normal. She exhibits no distension and no mass. There is no tenderness. There is no rebound and no guarding.  Genitourinary:  Breasts no masses skin changes or nipple changes bilaterally      Vulva is normal without lesions Vagina is pink moist without discharge Cervix normal in appearance and pap is done Uterus is normal size shape and contour Adnexa is negative with normal sized ovaries  {Rectal    hemoccult negative, normal tone, no masses  Musculoskeletal: Normal range of motion. She exhibits no edema and no tenderness.  Neurological: She is alert and oriented to person, place, and time. She has normal reflexes. She displays normal reflexes. No cranial nerve deficit. She exhibits normal muscle tone. Coordination normal.  Skin: Skin is warm and dry. No rash noted. No erythema. No pallor.  Psychiatric: She has a normal mood and affect. Her behavior is normal. Judgment and thought content normal.       Medications Ordered at today's visit: No orders of the defined types were placed in this encounter.   Other orders placed at today's visit: No orders of the defined types were placed in this encounter.     Assessment:    Healthy female exam.    Plan:    Mammogram ordered. Follow up in: 2 years.     Return in about 2 years (around 05/12/2019) for yearly, with Dr Elonda Husky.

## 2017-05-13 LAB — CYTOLOGY - PAP
Adequacy: ABSENT
DIAGNOSIS: NEGATIVE

## 2017-06-07 ENCOUNTER — Ambulatory Visit (HOSPITAL_COMMUNITY)
Admission: RE | Admit: 2017-06-07 | Discharge: 2017-06-07 | Disposition: A | Discharge status: Home / Self Care | Payer: BLUE CROSS/BLUE SHIELD | Source: Ambulatory Visit | Attending: Internal Medicine | Admitting: Internal Medicine

## 2017-06-07 DIAGNOSIS — Z1231 Encounter for screening mammogram for malignant neoplasm of breast: Secondary | ICD-10-CM | POA: Insufficient documentation

## 2017-08-11 ENCOUNTER — Ambulatory Visit (INDEPENDENT_AMBULATORY_CARE_PROVIDER_SITE_OTHER): Payer: BLUE CROSS/BLUE SHIELD | Admitting: Urology

## 2017-08-11 DIAGNOSIS — N200 Calculus of kidney: Secondary | ICD-10-CM

## 2018-11-27 ENCOUNTER — Encounter: Payer: Self-pay | Admitting: Internal Medicine

## 2019-10-17 ENCOUNTER — Ambulatory Visit (INDEPENDENT_AMBULATORY_CARE_PROVIDER_SITE_OTHER): Payer: PPO | Admitting: Nurse Practitioner

## 2019-10-17 ENCOUNTER — Other Ambulatory Visit: Payer: Self-pay

## 2019-10-17 ENCOUNTER — Encounter (INDEPENDENT_AMBULATORY_CARE_PROVIDER_SITE_OTHER): Payer: Self-pay | Admitting: Nurse Practitioner

## 2019-10-17 ENCOUNTER — Other Ambulatory Visit (INDEPENDENT_AMBULATORY_CARE_PROVIDER_SITE_OTHER): Payer: Self-pay | Admitting: Nurse Practitioner

## 2019-10-17 VITALS — BP 160/88 | HR 105 | Temp 97.6°F | Resp 14 | Ht 63.0 in | Wt 166.0 lb

## 2019-10-17 DIAGNOSIS — E559 Vitamin D deficiency, unspecified: Secondary | ICD-10-CM

## 2019-10-17 DIAGNOSIS — Z122 Encounter for screening for malignant neoplasm of respiratory organs: Secondary | ICD-10-CM

## 2019-10-17 DIAGNOSIS — L853 Xerosis cutis: Secondary | ICD-10-CM | POA: Insufficient documentation

## 2019-10-17 DIAGNOSIS — Z1211 Encounter for screening for malignant neoplasm of colon: Secondary | ICD-10-CM

## 2019-10-17 DIAGNOSIS — Z130 Encounter for screening for diseases of the blood and blood-forming organs and certain disorders involving the immune mechanism: Secondary | ICD-10-CM

## 2019-10-17 DIAGNOSIS — Z1231 Encounter for screening mammogram for malignant neoplasm of breast: Secondary | ICD-10-CM

## 2019-10-17 DIAGNOSIS — Z0001 Encounter for general adult medical examination with abnormal findings: Secondary | ICD-10-CM | POA: Insufficient documentation

## 2019-10-17 DIAGNOSIS — R5383 Other fatigue: Secondary | ICD-10-CM | POA: Diagnosis not present

## 2019-10-17 DIAGNOSIS — I10 Essential (primary) hypertension: Secondary | ICD-10-CM | POA: Diagnosis not present

## 2019-10-17 DIAGNOSIS — Z23 Encounter for immunization: Secondary | ICD-10-CM

## 2019-10-17 DIAGNOSIS — Z1329 Encounter for screening for other suspected endocrine disorder: Secondary | ICD-10-CM

## 2019-10-17 DIAGNOSIS — I2510 Atherosclerotic heart disease of native coronary artery without angina pectoris: Secondary | ICD-10-CM

## 2019-10-17 DIAGNOSIS — Z13220 Encounter for screening for lipoid disorders: Secondary | ICD-10-CM

## 2019-10-17 DIAGNOSIS — Z13228 Encounter for screening for other metabolic disorders: Secondary | ICD-10-CM

## 2019-10-17 HISTORY — DX: Vitamin D deficiency, unspecified: E55.9

## 2019-10-17 MED ORDER — LISINOPRIL 10 MG PO TABS: 10.0000 mg | ORAL_TABLET | Freq: Every morning | ORAL | 4 refills | Status: DC

## 2019-10-17 MED ORDER — METOPROLOL SUCCINATE ER 50 MG PO TB24: 50.0000 mg | ORAL_TABLET | Freq: Every day | ORAL | 4 refills | Status: DC

## 2019-10-17 MED ORDER — BLOOD PRESSURE MONITOR AUTOMAT DEVI: 1.0000 | Freq: Every day | 0 refills | Status: AC

## 2019-10-17 MED ORDER — ATORVASTATIN CALCIUM 80 MG PO TABS: 80.0000 mg | ORAL_TABLET | Freq: Every morning | ORAL | 4 refills | Status: DC

## 2019-10-17 MED ORDER — AMLODIPINE BESYLATE 2.5 MG PO TABS: 2.5000 mg | ORAL_TABLET | Freq: Every day | ORAL | 4 refills | Status: DC

## 2019-10-17 NOTE — Assessment & Plan Note (Signed)
Historically patient was on thyroid medication for the off label treatment of dry skin.  She is not currently on thyroid medication due to financial barriers and difficulties obtaining medical insurance.  We will collect thyroid panel today for further evaluation.

## 2019-10-17 NOTE — Patient Instructions (Signed)
Thank you for choosing [name] as your medical provider! If you have any questions or concerns regarding your health care, please do not hesitate to call our office.  It was great seeing you today!  Some things we discussed today: 1.  Immunizations: You will receive the flu and pneumonia shot today.  If you are interested in getting the tetanus or shingles vaccine you can discuss this with your pharmacist next time you are there.  2.  Health screenings: If you want to undergo an additional Pap smear please let this office know and we can refer you to OB/GYN for this.  We will do colon cancer screening via a stool sample, please return your stool sample to this office when you are able.  We will not do sexually transmitted infection screening per your request.  If you decide you are ready to quit smoking and would like assistance with this please let this office know.  I will send referral for you to undergo breast cancer screening via mammography. We will send you for lung cancer screening via low-dose CT scan of the chest. If you do not hear about scheduling these procedures from [facility] within 2 weeks please let our office know. Your depression screening was negative today. If you feel your mood is worsening, please let this office know. We will collect blood work for routine screenings. I will notify you of the results.   3. Chronic Conditions: I have refilled your prescriptions and sent them to your pharmacy. I have also prescribed and at-home blood pressure machine. Please check your blood pressure daily and keep a written log. We will go over this log at your next follow-up.  Please follow-up as scheduled in 2 weeks. We look forward to seeing you again soon! Have a great holiday season!!  At [facility] we value your feedback. You may receive a survey about your visit today. Please share your experience as we strive to create trusting relationships with our  patients to provide genuine, compassionate, quality care.  We appreciate your understanding and patience as we review any laboratory studies, imaging, and other diagnostic tests that are ordered as we care for you. We do our best to address any and all results in a timely manner. If you do not hear about test results within 1 week, please do not hesitate to contact us. If we referred you to a specialist during your visit or ordered imaging testing, contact the office if you have not been contacted to be scheduled within 1 weeks.  We also encourage the use of MyChart, which contains your medical information for your review as well. If you are not enrolled in this feature, an access code is on this after visit summary for your convenience. Thank you for allowing [place] to be involved in your care.    Health Maintenance, Female Adopting a healthy lifestyle and getting preventive care are important in promoting health and wellness. Ask your health care provider about:  The right schedule for you to have regular tests and exams.  Things you can do on your own to prevent diseases and keep yourself healthy. What should I know about diet, weight, and exercise? Eat a healthy diet   Eat a diet that includes plenty of vegetables, fruits, low-fat dairy products, and lean protein.  Do not eat a lot of foods that are high in solid fats, added sugars, or sodium. Maintain a healthy weight Body mass index (BMI) is used  to identify weight problems. It estimates body fat based on height and weight. Your health care provider can help determine your BMI and help you achieve or maintain a healthy weight. Get regular exercise Get regular exercise. This is one of the most important things you can do for your health. Most adults should:  Exercise for at least 150 minutes each week. The exercise should increase your heart rate and make you sweat (moderate-intensity exercise).  Do strengthening exercises at least twice  a week. This is in addition to the moderate-intensity exercise.  Spend less time sitting. Even light physical activity can be beneficial. Watch cholesterol and blood lipids Have your blood tested for lipids and cholesterol at 65 years of age, then have this test every 5 years. Have your cholesterol levels checked more often if:  Your lipid or cholesterol levels are high.  You are older than 65 years of age.  You are at high risk for heart disease. What should I know about cancer screening? Depending on your health history and family history, you may need to have cancer screening at various ages. This may include screening for:  Breast cancer.  Cervical cancer.  Colorectal cancer.  Skin cancer.  Lung cancer. What should I know about heart disease, diabetes, and high blood pressure? Blood pressure and heart disease  High blood pressure causes heart disease and increases the risk of stroke. This is more likely to develop in people who have high blood pressure readings, are of African descent, or are overweight.  Have your blood pressure checked: ? Every 3-5 years if you are 49-49 years of age. ? Every year if you are 77 years old or older. Diabetes Have regular diabetes screenings. This checks your fasting blood sugar level. Have the screening done:  Once every three years after age 50 if you are at a normal weight and have a low risk for diabetes.  More often and at a younger age if you are overweight or have a high risk for diabetes. What should I know about preventing infection? Hepatitis B If you have a higher risk for hepatitis B, you should be screened for this virus. Talk with your health care provider to find out if you are at risk for hepatitis B infection. Hepatitis C Testing is recommended for:  Everyone born from [birth year] through 1967.  Anyone with known risk factors for hepatitis C. Sexually transmitted infections (STIs)  Get screened for STIs, including  gonorrhea and chlamydia, if: ? You are sexually active and are younger than 65 years of age. ? You are older than 65 years of age and your health care provider tells you that you are at risk for this type of infection. ? Your sexual activity has changed since you were last screened, and you are at increased risk for chlamydia or gonorrhea. Ask your health care provider if you are at risk.  Ask your health care provider about whether you are at high risk for HIV. Your health care provider may recommend a prescription medicine to help prevent HIV infection. If you choose to take medicine to prevent HIV, you should first get tested for HIV. You should then be tested every 3 months for as long as you are taking the medicine. Pregnancy  If you are about to stop having your period (premenopausal) and you may become pregnant, seek counseling before you get pregnant.  Take 400 to 800 micrograms (mcg) of folic acid every day if you become pregnant.  Ask for  birth control (contraception) if you want to prevent pregnancy. Osteoporosis and menopause Osteoporosis is a disease in which the bones lose minerals and strength with aging. This can result in bone fractures. If you are 40 years old or older, or if you are at risk for osteoporosis and fractures, ask your health care provider if you should:  Be screened for bone loss.  Take a calcium or vitamin D supplement to lower your risk of fractures.  Be given hormone replacement therapy (HRT) to treat symptoms of menopause. Follow these instructions at home: Lifestyle  Do not use any products that contain nicotine or tobacco, such as cigarettes, e-cigarettes, and chewing tobacco. If you need help quitting, ask your health care provider.  Do not use street drugs.  Do not share needles.  Ask your health care provider for help if you need support or information about quitting drugs. Alcohol use  Do not drink alcohol if: ? Your health care provider  tells you not to drink. ? You are pregnant, may be pregnant, or are planning to become pregnant.  If you drink alcohol: ? Limit how much you use to 0-1 drink a day. ? Limit intake if you are breastfeeding.  Be aware of how much alcohol is in your drink. In the [place] one drink equals one 12 oz bottle of beer (355 mL), one 5 oz glass of wine (148 mL), or one 1 oz glass of hard liquor (44 mL). General instructions  Schedule regular health, dental, and eye exams.  Stay current with your vaccines.  Tell your health care provider if: ? You often feel depressed. ? You have ever been abused or do not feel safe at home. Summary  Adopting a healthy lifestyle and getting preventive care are important in promoting health and wellness.  Follow your health care provider's instructions about healthy diet, exercising, and getting tested or screened for diseases.  Follow your health care provider's instructions on monitoring your cholesterol and blood pressure. This information is not intended to replace advice given to you by your health care provider. Make sure you discuss any questions you have with your health care provider. Document Released: 05/18/2011 Document Revised: 10/26/2018 Document Reviewed: 10/26/2018 Elsevier Patient Education  2020 [place] and Cholesterol Restricted Eating Plan Eating a diet that limits fat and cholesterol may help lower your risk for heart disease and other conditions. Your body needs fat and cholesterol for basic functions, but eating too much of these things can be harmful to your health. Your health care provider may order lab tests to check your blood fat (lipid) and cholesterol levels. This helps your health care provider understand your risk for certain conditions and whether you need to make diet changes. Work with your health care provider or dietitian to make an eating plan that is right for you. Your plan includes:  Limit your fat intake to  ______% or less of your total calories a day.  Limit your saturated fat intake to ______% or less of your total calories a day.  Limit the amount of cholesterol in your diet to less than _________mg a day.  Eat ___________ g of fiber a day. What are tips for following this plan? General guidelines   If you are overweight, work with your health care provider to lose weight safely. Losing just 5-10% of your body weight can improve your overall health and help prevent diseases such as diabetes and heart disease.  Avoid: ? Foods with added  sugar. ? Fried foods. ? Foods that contain partially hydrogenated oils, including stick margarine, some tub margarines, cookies, crackers, and other baked goods.  Limit alcohol intake to no more than 1 drink a day for nonpregnant women and 2 drinks a day for men. One drink equals 12 oz of beer, 5 oz of wine, or 1 oz of hard liquor. Reading food labels  Check food labels for: ? Trans fats, partially hydrogenated oils, or high amounts of saturated fat. Avoid foods that contain saturated fat and trans fat. ? The amount of cholesterol in each serving. Try to eat no more than 200 mg of cholesterol each day. ? The amount of fiber in each serving. Try to eat at least 20-30 g of fiber each day.  Choose foods with healthy fats, such as: ? Monounsaturated and polyunsaturated fats. These include olive and canola oil, flaxseeds, walnuts, almonds, and seeds. ? Omega-3 fats. These are found in foods such as salmon, mackerel, sardines, tuna, flaxseed oil, and ground flaxseeds.  Choose grain products that have whole grains. Look for the word "whole" as the first word in the ingredient list. Cooking  Cook foods using methods other than frying. Baking, boiling, grilling, and broiling are some healthy options.  Eat more home-cooked food and less restaurant, buffet, and fast food.  Avoid cooking using saturated fats. ? Animal sources of saturated fats include meats,  butter, and cream. ? Plant sources of saturated fats include palm oil, palm kernel oil, and coconut oil. Meal planning   At meals, imagine dividing your plate into fourths: ? Fill one-half of your plate with vegetables and green salads. ? Fill one-fourth of your plate with whole grains. ? Fill one-fourth of your plate with lean protein foods.  Eat fish that is high in omega-3 fats at least two times a week.  Eat more foods that contain fiber, such as whole grains, beans, apples, broccoli, carrots, peas, and barley. These foods help promote healthy cholesterol levels in the blood. Recommended foods Grains  Whole grains, such as whole wheat or whole grain breads, crackers, cereals, and pasta. Unsweetened oatmeal, bulgur, barley, quinoa, or brown rice. Corn or whole wheat flour tortillas. Vegetables  Fresh or frozen vegetables (raw, steamed, roasted, or grilled). Green salads. Fruits  All fresh, canned (in natural juice), or frozen fruits. Meats and other protein foods  Ground beef (85% or leaner), grass-fed beef, or beef trimmed of fat. Skinless chicken or [place]. Ground chicken or [place]. Pork trimmed of fat. All fish and seafood. Egg whites. Dried beans, peas, or lentils. Unsalted nuts or seeds. Unsalted canned beans. Natural nut butters without added sugar and oil. Dairy  Low-fat or nonfat dairy products, such as skim or 1% milk, 2% or reduced-fat cheeses, low-fat and fat-free ricotta or cottage cheese, or plain low-fat and nonfat yogurt. Fats and oils  Tub margarine without trans fats. Light or reduced-fat mayonnaise and salad dressings. Avocado. Olive, canola, sesame, or safflower oils. The items listed above may not be a complete list of recommended foods or beverages. Contact your dietitian for more options. Foods to avoid Grains  White bread. White pasta. White rice. Cornbread. Bagels, pastries, and croissants. Crackers and snack foods that contain trans fat and hydrogenated  oils. Vegetables  Vegetables cooked in cheese, cream, or butter sauce. Fried vegetables. Fruits  Canned fruit in heavy syrup. Fruit in cream or butter sauce. Fried fruit. Meats and other protein foods  Fatty cuts of meat. Ribs, chicken wings, bacon, sausage, bologna, salami,  chitterlings, fatback, hot dogs, bratwurst, and packaged lunch meats. Liver and organ meats. Whole eggs and egg yolks. Chicken and [place] with skin. Fried meat. Dairy  Whole or 2% milk, cream, half-and-half, and cream cheese. Whole milk cheeses. Whole-fat or sweetened yogurt. Full-fat cheeses. Nondairy creamers and whipped toppings. Processed cheese, cheese spreads, and cheese curds. Beverages  Alcohol. Sugar-sweetened drinks such as sodas, lemonade, and fruit drinks. Fats and oils  Butter, stick margarine, lard, shortening, ghee, or bacon fat. Coconut, palm kernel, and palm oils. Sweets and desserts  Corn syrup, sugars, honey, and molasses. Candy. Jam and jelly. Syrup. Sweetened cereals. Cookies, pies, cakes, donuts, muffins, and ice cream. The items listed above may not be a complete list of foods and beverages to avoid. Contact your dietitian for more information. Summary  Your body needs fat and cholesterol for basic functions. However, eating too much of these things can be harmful to your health.  Work with your health care provider and dietitian to follow a diet low in fat and cholesterol. Doing this may help lower your risk for heart disease and other conditions.  Choose healthy fats, such as monounsaturated and polyunsaturated fats, and foods high in omega-3 fatty acids.  Eat fiber-rich foods, such as whole grains, beans, peas, fruits, and vegetables.  Limit or avoid alcohol, fried foods, and foods high in saturated fats, partially hydrogenated oils, and sugar. This information is not intended to replace advice given to you by your health care provider. Make sure you discuss any questions you have with  your health care provider. Document Released: 11/02/2005 Document Revised: 10/15/2017 Document Reviewed: 07/20/2017 Elsevier Patient Education  2020 [employer].

## 2019-10-17 NOTE — Assessment & Plan Note (Signed)
Blood pressure uncontrolled today, however she has been out of all of her blood pressure medications.  These were all refilled today.  I also sent a prescription for at home blood pressure cuff.  I have encouraged her to check her blood pressure daily and keep a written log of this.  She will follow-up in 2 weeks at which point we will check her blood pressure again.

## 2019-10-17 NOTE — Assessment & Plan Note (Signed)
Patient has been out of all medications.  Refills for antihypertensives and cholesterol medication refilled.  She will continue on all these medications.  As stated under plan for essential hypertension she will monitor blood pressure at home, and will follow-up in 2 weeks.  EKG today appeared fairly benign.  She did have some premature atrial contractions, some T wave depression noted as well.  Patient is not having any chest pain currently.  We will continue to monitor closely, may need to consider referral to cardiology for regular follow-up, but will discuss this further at her next office visit.

## 2019-10-17 NOTE — Assessment & Plan Note (Signed)
We will administer flu shot and pneumonia vaccine today.  She was encouraged to discuss tetanus and shingles vaccine with her pharmacist.  We will not refer her to OB/GYN for Pap smear, but she was encouraged to call us if she would like referral.  She is willing to undergo colon cancer screening via fecal immunoglobulin testing, kit provided to patient today.  Will not do sexually transmitted infection screening per patient request.  Smoking cessation encouraged, patient is not ready to quit today.  We will refer to [facility] radiology for breast cancer screening via mammography and lung cancer screening via low-dose CT scan.  Depression screening was negative today.  We will screen for hepatitis C today as well.  We will collect blood work for screenings as well as evaluation of chronic conditions including CBC, CMP, vitamin D, A1c, thyroid panel, lipid panel.  Further recommendations will be made based on that result.  She will follow-up as scheduled in 2 weeks, but will be due for annual exam at this time next year.

## 2019-10-17 NOTE — Assessment & Plan Note (Signed)
Patient will continue on current vitamin D supplementation.  We will collect blood work today for further evaluation.  Further recommendations may be made based on that result.

## 2019-10-17 NOTE — Progress Notes (Signed)
Subjective:  Patient ID: Leah [name], female    DOB: February 24, 1956  Age: 63 y.o. MRN: [id number]  CC:  Chief Complaint  Patient presents with   Annual Exam      HPI  This patient presents today for her annual physical exam.  Health maintenance: 1.  Immunizations: She is due for her flu and pneumonia vaccine today.  She also thinks that she is probably due for tetanus and shingles, but would like to hold off on these immunizations today. 2.  Screenings: Last Pap smear was completed in 2018.  The patient tells me she has never had a abnormal Pap smear.  She believes she had at least 2 normal Pap smears within the last 5 years.  She does not want to undergo additional Pap smear at this time.  She is due for colon cancer screening.  She tells me she thinks she had a colonoscopy when she turned 69 (which is approximately 15 years ago), she tells me she did have a benign polyp at that time.  She would like to hold off on a colonoscopy, but would be willing to undergo fecal immunoglobulin testing.  She does not want to undergo sexually transmitted infection screening today.  She is a current smoker and is not ready to quit smoking at this time.  She is due for mammogram.  She is due for depression screening.  She would like to undergo hepatitis C screening.  She would be interested in undergoing lung cancer screening with low-dose CT scan.  I did discuss with her risks and benefits of CT scan.  Specifically that benefits are diagnosis of cancer at early stage.  And that risks are exposure to radiation as well as potential for additional testing procedures.  She verbalizes understanding and would like to undergo screening.  She tells me she has been without health insurance for a while, and now has health insurance.  Thus she has not been to a doctor in a while and is out of all her medications.  Her chronic conditions include hypertension, hyperlipidemia, and vitamin D deficiency.  For  hypertension she is on amlodipine, lisinopril, and metoprolol.  For hyperlipidemia she does take Lipitor and low-dose aspirin.  She also takes vitamin D supplementation in the form of vitamin D3.   Past Medical History:  Diagnosis Date   Arthritis    Borderline diabetes    Breast lump on right side at 3 o'clock position    Benign, fatty mass   Diabetes mellitus without complication ([place])    patient reports "borderline diabetes"   History of kidney stones    History of shingles    Hyperlipidemia    Hypertension    Myocardial infarction ([place]) 1996    HAD ANGIOPLASTY WITH 2 STENTS   Tinnitus    right ear    Vitamin D deficiency 10/17/2019   Wears glasses     Health Maintenance  Topic Date Due   MAMMOGRAM  06/08/2019   INFLUENZA VACCINE  02/14/2020 (Originally 06/17/2019)   COLONOSCOPY  10/16/2020 (Originally 10/27/2004)   TETANUS/TDAP  10/16/2020 (Originally 10/27/1973)   HIV Screening  10/16/2020 (Originally 10/27/1969)   PAP SMEAR-Modifier  05/11/2020   Hepatitis C Screening  Completed    Additional Screenings/Health maintenance: 1. Cervical Cancer Screening: Most likely due 2021, but patient does not want to undergo additional cervical cancer screening. 2. Colon Cancer Screening: 2005, Due: Now 3. Sexually Transmitted Infection Screening: Will not be completed today per patient's  preference 4. Breast Cancer Screening: 2018, Due: Now 5. Diabetic Screening: Due: Now 6. Hepatitis C Screening: Due now 7. Lung Cancer Screening: Not completed, patient would qualify, Due: Now 8. Smoking Cessation: Patient is not ready to quit at this time., Due: Now, she was encouraged to call the office if she would like assistance with smoking cessation.  Family History  Problem Relation Age of Onset   Heart disease Mother    Diabetes Mother    Cancer Father        bone   Cirrhosis Father    Stroke Maternal Grandmother    Stroke Maternal Grandfather    Heart  disease Sister    Obesity Brother     Social History   Social History Narrative   Not on file   Social History   Tobacco Use   Smoking status: Current Every Day Smoker    Packs/day: 1.00    Years: 40.00    Pack years: 40.00    Types: Cigarettes   Smokeless tobacco: Never Used  Substance Use Topics   Alcohol use: No    Alcohol/week: 0.0 standard drinks     Current Meds  Medication Sig   amLODipine (NORVASC) 2.5 MG tablet Take 1 tablet (2.5 mg total) by mouth daily.   aspirin EC 81 MG tablet Take 81 mg by mouth every morning.    atorvastatin (LIPITOR) 80 MG tablet Take 1 tablet (80 mg total) by mouth every morning.   Cholecalciferol (VITAMIN D-3) 1000 UNITS CAPS Take 1,000 Units by mouth 2 (two) times daily.    Garlic [id number] MG TBEC Take 2,000 mg by mouth.   lisinopril (ZESTRIL) 10 MG tablet Take 1 tablet (10 mg total) by mouth every morning.   metoprolol succinate (TOPROL-XL) 50 MG 24 hr tablet Take 1 tablet (50 mg total) by mouth daily. Take with or immediately following a meal.   Multiple Vitamin (MULTIVITAMIN WITH MINERALS) TABS tablet Take 1 tablet by mouth every morning.   Omega-3 Fatty Acids (OMEGA 3 500 PO) Take 500 mg by mouth every morning.   Potassium Gluconate 550 (90 K) MG TABS Take 550 mg by mouth.   vitamin B-12 (CYANOCOBALAMIN) 1000 MCG tablet Take 5,000 mcg by mouth daily.   vitamin E 400 UNIT capsule Take 400 Units by mouth daily.   [DISCONTINUED] amLODipine (NORVASC) 2.5 MG tablet Take 2.5 mg by mouth daily.   [DISCONTINUED] atorvastatin (LIPITOR) 80 MG tablet Take 80 mg by mouth every morning.   [DISCONTINUED] lisinopril (PRINIVIL,ZESTRIL) 10 MG tablet Take 10 mg by mouth every morning.   [DISCONTINUED] metoprolol succinate (TOPROL-XL) 50 MG 24 hr tablet Take 50 mg by mouth daily. Take with or immediately following a meal.    ROS:  Review of Systems  Constitutional: Negative for chills, fever, malaise/fatigue and weight loss.  HENT:  Positive for tinnitus. Negative for congestion, ear discharge, ear pain, hearing loss and sore throat.   Eyes: Negative for blurred vision, double vision, pain and discharge.  Respiratory: Negative for cough, shortness of breath and wheezing.   Cardiovascular: Negative for chest pain and palpitations.  Gastrointestinal: Positive for constipation. Negative for abdominal pain, blood in stool, diarrhea, nausea and vomiting.  Genitourinary: Negative for dysuria and hematuria.  Musculoskeletal: Positive for back pain (controlled with application of heat). Negative for joint pain and myalgias.  Skin: Negative for rash.       Dry skin  Neurological: Negative for dizziness (balance is off at times; uses furniture to keep from falling),  sensory change, weakness and headaches.  Psychiatric/Behavioral: Negative for depression and suicidal ideas.     Objective:   Today's Vitals: BP (!) 160/88    Pulse (!) 105    Temp 97.6 F (36.4 C)    Resp 14    Ht 5\' 3"  (1.6 m)    Wt 166 lb (75.3 kg)    SpO2 99%    BMI 29.41 kg/m  Vitals with BMI 10/17/2019 10/17/2019 05/11/2017  Height - 5\' 3"  5\' 3"   Weight - 166 lbs 180 lbs  BMI - [id number] 32  Systolic 0000000 [id number]  Diastolic 88 88 80  Pulse - 105 76     Physical Exam Vitals signs reviewed.  Constitutional:      Appearance: Normal appearance.  HENT:     Head: Normocephalic and atraumatic.  Eyes:     General:        Right eye: No discharge.        Left eye: No discharge.     Extraocular Movements: Extraocular movements intact.     Conjunctiva/sclera: Conjunctivae normal.     Pupils: Pupils are equal, round, and reactive to light.  Neck:     Musculoskeletal: Neck supple. No muscular tenderness.     Vascular: No carotid bruit.  Cardiovascular:     Rate and Rhythm: Normal rate and regular rhythm.     Pulses: Normal pulses.     Heart sounds: Normal heart sounds. No murmur.  Pulmonary:     Effort: Pulmonary effort is normal.     Breath sounds: Normal  breath sounds.  Chest:     Breasts: Breasts are symmetrical.        Right: Normal.        Left: Normal.  Abdominal:     General: Abdomen is flat. Bowel sounds are normal. There is no distension.     Palpations: Abdomen is soft. There is no mass.     Tenderness: There is no abdominal tenderness.  Musculoskeletal:        General: No tenderness.     Right lower leg: No edema.     Left lower leg: No edema.  Lymphadenopathy:     Cervical: No cervical adenopathy.     Upper Body:     Right upper body: No supraclavicular adenopathy.     Left upper body: No supraclavicular adenopathy.  Skin:    General: Skin is warm and dry.  Neurological:     General: No focal deficit present.     Mental Status: She is alert and oriented to person, place, and time.     Motor: No weakness.     Gait: Gait normal.  Psychiatric:        Mood and Affect: Mood normal.        Behavior: Behavior normal.        Judgment: Judgment normal.          Assessment   1. Encounter for general adult medical examination with abnormal findings   2. Screen for colon cancer   3. Encounter for screening for malignant neoplasm of respiratory organs   4. Vitamin D deficiency   5. Essential hypertension   6. Fatigue, unspecified type   7. Encounter for screening mammogram for malignant neoplasm of breast   8. Visit for screening mammogram   9. Colon cancer screening   10. Screening for deficiency anemia   11. Screening for lipoid disorders   12. Screening for endocrine, metabolic and immunity disorder  13. Need for immunization against influenza   14. Need for 23-polyvalent pneumococcal polysaccharide vaccine   15. 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 Encounter for annual general medical examination with abnormal findings in adult   17. Dry skin   18. Atherosclerosis of coronary artery of native heart without angina pectoris, unspecified  vessel or lesion type       Tests ordered Orders Placed This Encounter  Procedures   Mammogram Digital Diagnostic Bilateral   CT CHEST WO/CM SCREENING   Fecal Globin By Immunochemistry   Vitamin D, 25-hydroxy   T3, Free   T4, Free   CBC   Comprehensive metabolic panel   Hemoglobin A1c   Lipid panel   Hepatitis C antibody screen   TSH   EKG 12-Lead     Plan: Please see assessment and plan per problem list below.   Meds ordered this encounter  Medications   amLODipine (NORVASC) 2.5 MG tablet    Sig: Take 1 tablet (2.5 mg total) by mouth daily.    Dispense:  30 tablet    Refill:  4    Order Specific Question:   Supervising Provider    Answer:   Hurshel Party C [id number]   atorvastatin (LIPITOR) 80 MG tablet    Sig: Take 1 tablet (80 mg total) by mouth every morning.    Dispense:  30 tablet    Refill:  4    Order Specific Question:   Supervising Provider    Answer:   Hurshel Party C [id number]   lisinopril (ZESTRIL) 10 MG tablet    Sig: Take 1 tablet (10 mg total) by mouth every morning.    Dispense:  30 tablet    Refill:  4    Order Specific Question:   Supervising Provider    Answer:   Hurshel Party C [id number]   metoprolol succinate (TOPROL-XL) 50 MG 24 hr tablet    Sig: Take 1 tablet (50 mg total) by mouth daily. Take with or immediately following a meal.    Dispense:  30 tablet    Refill:  4    Order Specific Question:   Supervising Provider    Answer:   Doree Albee [id number]   Blood Pressure Monitoring (BLOOD PRESSURE MONITOR AUTOMAT) DEVI    Sig: 1 Device by Does not apply route daily. Check blood pressure daily    Dispense:  1 each    Refill:  0    Order Specific Question:   Supervising Provider    Answer:   Doree Albee [id number]    Patient to follow-up in 2 weeks.  Ailene Ards, NP

## 2019-10-18 LAB — CBC
HCT: 48.3 % — ABNORMAL HIGH (ref 35.0–45.0)
Hemoglobin: 16.3 g/dL — ABNORMAL HIGH (ref 11.7–15.5)
MCH: 29.9 pg (ref 27.0–33.0)
MCHC: 33.7 g/dL (ref 32.0–36.0)
MCV: 88.6 fL (ref 80.0–100.0)
MPV: 11.3 fL (ref 7.5–12.5)
Platelets: 268 10*3/uL (ref 140–400)
RBC: 5.45 10*6/uL — ABNORMAL HIGH (ref 3.80–5.10)
RDW: 14.4 % (ref 11.0–15.0)
WBC: 6.3 10*3/uL (ref 3.8–10.8)

## 2019-10-18 LAB — COMPREHENSIVE METABOLIC PANEL
AG Ratio: 1.2 (calc) (ref 1.0–2.5)
ALT: 12 U/L (ref 6–29)
AST: 18 U/L (ref 10–35)
Albumin: 4 g/dL (ref 3.6–5.1)
Alkaline phosphatase (APISO): 147 U/L (ref 37–153)
BUN/Creatinine Ratio: 7 (calc) (ref 6–22)
BUN: 7 mg/dL (ref 7–25)
CO2: 24 mmol/L (ref 20–32)
Calcium: 10 mg/dL (ref 8.6–10.4)
Chloride: 101 mmol/L (ref 98–110)
Creat: 1.01 mg/dL — ABNORMAL HIGH (ref 0.50–0.99)
Globulin: 3.3 g/dL (calc) (ref 1.9–3.7)
Glucose, Bld: 86 mg/dL (ref 65–99)
Potassium: 4 mmol/L (ref 3.5–5.3)
Sodium: 139 mmol/L (ref 135–146)
Total Bilirubin: 0.3 mg/dL (ref 0.2–1.2)
Total Protein: 7.3 g/dL (ref 6.1–8.1)

## 2019-10-18 LAB — HEPATITIS C ANTIBODY
Hepatitis C Ab: NONREACTIVE
SIGNAL TO CUT-OFF: 0.07 (ref <1.00)

## 2019-10-18 LAB — HEMOGLOBIN A1C
Hgb A1c MFr Bld: 5.6 % of total Hgb (ref <5.7)
Mean Plasma Glucose: 114 (calc)
eAG (mmol/L): 6.3 (calc)

## 2019-10-18 LAB — LIPID PANEL
Cholesterol: 285 mg/dL — ABNORMAL HIGH (ref <200)
HDL: 59 mg/dL (ref >=50)
LDL Cholesterol (Calc): 188 mg/dL (calc) — ABNORMAL HIGH
Non-HDL Cholesterol (Calc): 226 mg/dL (calc) — ABNORMAL HIGH (ref <130)
Total CHOL/HDL Ratio: 4.8 (calc) (ref <5.0)
Triglycerides: 197 mg/dL — ABNORMAL HIGH (ref <150)

## 2019-10-18 LAB — VITAMIN D 25 HYDROXY (VIT D DEFICIENCY, FRACTURES): Vit D, 25-Hydroxy: 42 ng/mL (ref 30–100)

## 2019-10-18 LAB — T4, FREE: Free T4: 1.2 ng/dL (ref 0.8–1.8)

## 2019-10-18 LAB — TSH: TSH: 1.94 mIU/L (ref 0.40–4.50)

## 2019-10-18 LAB — T3, FREE: T3, Free: 2.9 pg/mL (ref 2.3–4.2)

## 2019-10-20 ENCOUNTER — Other Ambulatory Visit (INDEPENDENT_AMBULATORY_CARE_PROVIDER_SITE_OTHER): Payer: Self-pay | Admitting: Nurse Practitioner

## 2019-10-20 DIAGNOSIS — Z122 Encounter for screening for malignant neoplasm of respiratory organs: Secondary | ICD-10-CM

## 2019-10-23 ENCOUNTER — Encounter (INDEPENDENT_AMBULATORY_CARE_PROVIDER_SITE_OTHER): Payer: Self-pay | Admitting: Nurse Practitioner

## 2019-10-24 ENCOUNTER — Other Ambulatory Visit (HOSPITAL_COMMUNITY): Payer: Self-pay | Admitting: Nurse Practitioner

## 2019-10-24 DIAGNOSIS — Z1231 Encounter for screening mammogram for malignant neoplasm of breast: Secondary | ICD-10-CM

## 2019-10-26 ENCOUNTER — Other Ambulatory Visit: Payer: Self-pay

## 2019-10-26 ENCOUNTER — Ambulatory Visit (HOSPITAL_COMMUNITY)
Admission: RE | Admit: 2019-10-26 | Discharge: 2019-10-26 | Disposition: A | Discharge status: Home / Self Care | Payer: PPO | Source: Ambulatory Visit | Attending: Nurse Practitioner | Admitting: Nurse Practitioner

## 2019-10-26 DIAGNOSIS — Z1231 Encounter for screening mammogram for malignant neoplasm of breast: Secondary | ICD-10-CM | POA: Diagnosis not present

## 2019-10-31 ENCOUNTER — Other Ambulatory Visit (HOSPITAL_COMMUNITY): Payer: PPO

## 2019-10-31 ENCOUNTER — Encounter (HOSPITAL_COMMUNITY): Payer: PPO

## 2019-11-02 ENCOUNTER — Ambulatory Visit (HOSPITAL_COMMUNITY): Admission: RE | Admit: 2019-11-02 | Payer: PPO | Source: Ambulatory Visit

## 2019-11-07 ENCOUNTER — Encounter (INDEPENDENT_AMBULATORY_CARE_PROVIDER_SITE_OTHER): Payer: Self-pay | Admitting: Nurse Practitioner

## 2019-11-07 ENCOUNTER — Other Ambulatory Visit: Payer: Self-pay

## 2019-11-07 ENCOUNTER — Ambulatory Visit (INDEPENDENT_AMBULATORY_CARE_PROVIDER_SITE_OTHER): Payer: PPO | Admitting: Nurse Practitioner

## 2019-11-07 VITALS — Ht 63.0 in | Wt 165.0 lb

## 2019-11-07 DIAGNOSIS — I2510 Atherosclerotic heart disease of native coronary artery without angina pectoris: Secondary | ICD-10-CM | POA: Diagnosis not present

## 2019-11-07 DIAGNOSIS — D751 Secondary polycythemia: Secondary | ICD-10-CM | POA: Diagnosis not present

## 2019-11-07 DIAGNOSIS — E785 Hyperlipidemia, unspecified: Secondary | ICD-10-CM

## 2019-11-07 DIAGNOSIS — I10 Essential (primary) hypertension: Secondary | ICD-10-CM

## 2019-11-07 DIAGNOSIS — R9431 Abnormal electrocardiogram [ECG] [EKG]: Secondary | ICD-10-CM | POA: Diagnosis not present

## 2019-11-07 NOTE — Progress Notes (Signed)
Due to national recommendations of social distancing related to the [place] pandemic, an audio/visual tele-health visit was felt to be the most appropriate encounter type for this patient today. I connected with  Leah [name] on 11/07/19 utilizing audio/visual technology and verified that I am speaking with the correct person using two identifiers. The patient was located at their home, and I was located at the office of [facility] during the encounter. I discussed the limitations of evaluation and management by telemedicine. The patient expressed understanding and agreed to proceed.       Subjective:  Patient ID: Leah [name], female    DOB: 06-Apr-1956  Age: 63 y.o. MRN: [id number]  CC:  Chief Complaint  Patient presents with  . Follow-up      HPI  This patient presents for virtual visit for follow-up of chronic conditions.  Hypertension: She was seen a few weeks ago in this office and was restarted on all her antihypertensive medications as she just regained medical insurance.  At that time I had prescribed a at home blood pressure monitoring cuff and recommended that she start checking her blood pressure at home.  She tells me she has not yet been able to do this, but plans on picking up an at home cuff soon.  She does report taking all her medication as prescribed. (Amlodipine 2.5 mg, lisinopril 10 mg, metoprolol 50 mg).  Hyperlipidemia: She was restarted on her atorvastatin approximately 1 month ago at her last office visit.  Last lipid panel was collected at that time and showed uncontrolled cholesterol.  See below. Lipid Panel     Component Value Date/Time   CHOL 285 (H) 10/17/2019 1130   TRIG 197 (H) 10/17/2019 1130   HDL 59 10/17/2019 1130   CHOLHDL 4.8 10/17/2019 1130   VLDL 21 10/10/2008 2241   LDLCALC 188 (H) 10/17/2019 1130   Polycythemia: CBC was also collected at her last office visit and did show polycythemia.  Per chart review I note that she had an  elevated hemoglobin back in 2017.  She tells me today that she is never been notified of this nor has she ever undergone additional evaluation for this.  She does smoke cigarettes regularly and has been smoking for approximately 40 years.  She does not have any worsening fatigue or unintentional weight loss.  See below. CBC    Component Value Date/Time   WBC 6.3 10/17/2019 1130   RBC 5.45 (H) 10/17/2019 1130   HGB 16.3 (H) 10/17/2019 1130   HCT 48.3 (H) 10/17/2019 1130   PLT 268 10/17/2019 1130   MCV 88.6 10/17/2019 1130   MCH 29.9 10/17/2019 1130   MCHC 33.7 10/17/2019 1130   RDW 14.4 10/17/2019 1130   LYMPHSABS 1.9 08/21/2015 1653   MONOABS 0.3 08/21/2015 1653   EOSABS 0.1 08/21/2015 1653   BASOSABS 0.1 08/21/2015 1653    Coronary artery disease: She also has a history of coronary artery disease and had an MI back when she was 63 years of age.  This was approximately 25 years ago.  EKG was collected at last office visit we did show some T wave depression.  She denies any current chest pain.  She has not seen a cardiologist in many years.  Past Medical History:  Diagnosis Date  . Arthritis   . Borderline diabetes   . Breast lump on right side at 3 o'clock position    Benign, fatty mass  . Diabetes mellitus without complication ([place])  patient reports "borderline diabetes"  . History of kidney stones   . History of shingles   . Hyperlipidemia   . Hypertension   . Myocardial infarction ([place]) 1996    HAD ANGIOPLASTY WITH 2 STENTS  . Tinnitus    right ear   . Vitamin D deficiency 10/17/2019  . Wears glasses       Family History  Problem Relation Age of Onset  . Heart disease Mother   . Diabetes Mother   . Cancer Father        bone  . Cirrhosis Father   . Stroke Maternal Grandmother   . Stroke Maternal Grandfather   . Heart disease Sister   . Obesity Brother     Social History   Social History Narrative  . Not on file   Social History   Tobacco Use  .  Smoking status: Current Every Day Smoker    Packs/day: 1.00    Years: 40.00    Pack years: 40.00    Types: Cigarettes  . Smokeless tobacco: Never Used  Substance Use Topics  . Alcohol use: No    Alcohol/week: 0.0 standard drinks     Current Meds  Medication Sig  . amLODipine (NORVASC) 2.5 MG tablet Take 1 tablet (2.5 mg total) by mouth daily.  Marland Kitchen aspirin EC 81 MG tablet Take 81 mg by mouth every morning.   Marland Kitchen atorvastatin (LIPITOR) 80 MG tablet Take 1 tablet (80 mg total) by mouth every morning.  . Cholecalciferol (VITAMIN D-3) 1000 UNITS CAPS Take 1,000 Units by mouth 2 (two) times daily.   Marland Kitchen lisinopril (ZESTRIL) 10 MG tablet Take 1 tablet (10 mg total) by mouth every morning.  . metoprolol succinate (TOPROL-XL) 50 MG 24 hr tablet Take 1 tablet (50 mg total) by mouth daily. Take with or immediately following a meal.  . Multiple Vitamin (MULTIVITAMIN WITH MINERALS) TABS tablet Take 1 tablet by mouth every morning.  . Omega-3 Fatty Acids (OMEGA 3 500 PO) Take 500 mg by mouth every morning.  . vitamin B-12 (CYANOCOBALAMIN) 1000 MCG tablet Take 5,000 mcg by mouth daily.  . vitamin E 400 UNIT capsule Take 400 Units by mouth daily.  . [DISCONTINUED] Garlic [id number] MG TBEC Take 2,000 mg by mouth.    ROS:  Review of Systems  Constitutional: Negative for fever and malaise/fatigue.  Eyes: Negative for blurred vision.  Respiratory: Negative for cough, shortness of breath and wheezing.   Cardiovascular: Negative for chest pain and palpitations.  Gastrointestinal: Negative for abdominal pain, blood in stool and melena.  Neurological: Negative for headaches.     Objective:   Today's Vitals: Ht 5\' 3"  (1.6 m)   Wt 165 lb (74.8 kg) Comment: yesterday on at-home scale  BMI 29.23 kg/m  Vitals with BMI 11/07/2019 10/17/2019 10/17/2019  Height 5\' 3"  - 5\' 3"   Weight 165 lbs - 166 lbs  BMI 123456 - [id number]  Systolic - 0000000 [id number]  Diastolic - 88 88  Pulse - - 105     Physical Exam Comprehensive  physical exam not completed today as office visit was conducted remotely.  Patient did appear well on video, she was dressed appropriately, she answered questions appropriately, she was alert and oriented, and had appropriate judgment.      Assessment   1. Polycythemia   2. Abnormal EKG   3. Atherosclerosis of coronary artery of native heart without angina pectoris, unspecified vessel or lesion type   4. Essential hypertension   5. Hyperlipidemia, unspecified  hyperlipidemia type   6. POLYCYTHEMIA       Tests ordered Orders Placed This Encounter  Procedures  . Ambulatory referral to Cardiology  . Ambulatory referral to Hematology     Plan: Please see assessment and plan per problem list below.   No orders of the defined types were placed in this encounter.   Patient to follow-up in 2 weeks.  This video visit lasted for 17 minutes.  Ailene Ards, NP

## 2019-11-07 NOTE — Assessment & Plan Note (Signed)
Etiology most likely related to her chronic tobacco use.  However I do want her to be evaluated for other possible causes by hematologist.  I will send referral, she is agreeable to this.  We will collect CBC with differential and peripheral smear prior at her next office visit so that is available to hematologist when she is seen by them.

## 2019-11-07 NOTE — Assessment & Plan Note (Signed)
She will continue taking her medications as prescribed.  She will come into the office in 2 weeks for blood pressure check.  I again encouraged her to check her blood pressure at home when she is able to get an at home cuff.  She tells me she understands.  We will need to consider getting basic metabolic panel at next office visit for further evaluation after restarting her lisinopril.

## 2019-11-07 NOTE — Assessment & Plan Note (Signed)
She will continue on current medications as prescribed.  We will collect lipid panel and CMP at next office visit.

## 2019-11-07 NOTE — Assessment & Plan Note (Signed)
I will refer her to cardiology for further evaluation regarding her abnormal EKG.

## 2019-11-13 ENCOUNTER — Other Ambulatory Visit (HOSPITAL_COMMUNITY): Payer: Self-pay | Admitting: Internal Medicine

## 2019-11-13 ENCOUNTER — Other Ambulatory Visit: Payer: Self-pay | Admitting: Internal Medicine

## 2019-11-13 DIAGNOSIS — Z122 Encounter for screening for malignant neoplasm of respiratory organs: Secondary | ICD-10-CM

## 2019-11-20 ENCOUNTER — Ambulatory Visit (INDEPENDENT_AMBULATORY_CARE_PROVIDER_SITE_OTHER): Payer: PPO | Admitting: Nurse Practitioner

## 2019-11-20 ENCOUNTER — Telehealth (INDEPENDENT_AMBULATORY_CARE_PROVIDER_SITE_OTHER): Payer: Self-pay

## 2019-11-21 ENCOUNTER — Ambulatory Visit (HOSPITAL_COMMUNITY): Payer: PPO

## 2019-11-21 ENCOUNTER — Ambulatory Visit (HOSPITAL_COMMUNITY): Admission: RE | Admit: 2019-11-21 | Payer: PPO | Source: Ambulatory Visit

## 2019-11-21 ENCOUNTER — Other Ambulatory Visit (INDEPENDENT_AMBULATORY_CARE_PROVIDER_SITE_OTHER): Payer: Self-pay | Admitting: Nurse Practitioner

## 2019-11-21 ENCOUNTER — Ambulatory Visit (INDEPENDENT_AMBULATORY_CARE_PROVIDER_SITE_OTHER): Payer: PPO | Admitting: Internal Medicine

## 2019-11-21 ENCOUNTER — Encounter (INDEPENDENT_AMBULATORY_CARE_PROVIDER_SITE_OTHER): Payer: Self-pay | Admitting: Internal Medicine

## 2019-11-21 ENCOUNTER — Encounter (HOSPITAL_COMMUNITY): Payer: Self-pay

## 2019-11-21 ENCOUNTER — Other Ambulatory Visit: Payer: Self-pay

## 2019-11-21 DIAGNOSIS — E785 Hyperlipidemia, unspecified: Secondary | ICD-10-CM

## 2019-11-21 DIAGNOSIS — D751 Secondary polycythemia: Secondary | ICD-10-CM

## 2019-11-21 DIAGNOSIS — I10 Essential (primary) hypertension: Secondary | ICD-10-CM

## 2019-11-21 NOTE — Progress Notes (Signed)
Metrics: Intervention Frequency ACO  Documented Smoking Status Yearly  Screened one or more times in 24 months  Cessation Counseling or  Active cessation medication Past 24 months  Past 24 months   Guideline developer: UpToDate (See UpToDate for funding source) Date Released: 2014       Wellness Office Visit  Subjective:  Patient ID: Leah [name], female    DOB: 14-Apr-1956  Age: 64 y.o. MRN: [id number]  CC: This is an audio telemedicine visit with the permission of the patient who is at home and I am in my office.  I was is easily able to recognize her voice.  Unfortunately, she was exposed to COVID-19 patient and therefore would not able to come into the office.  This is a visit to follow-up on her hypertension. HPI She was seen by Judson Roch a couple of weeks ago and medications were adjusted.  Unfortunately, she has not obtained a blood pressure machine so was not able to tell me her current blood pressure.  She feels well and is tolerating the changes.  She denies any chest pain, dyspnea, palpitations or limb weakness.  Past Medical History:  Diagnosis Date  . Arthritis   . Borderline diabetes   . Breast lump on right side at 3 o'clock position    Benign, fatty mass  . Diabetes mellitus without complication [facility])    patient reports "borderline diabetes"  . History of kidney stones   . History of shingles   . Hyperlipidemia   . Hypertension   . Myocardial infarction ([place]) 1996    HAD ANGIOPLASTY WITH 2 STENTS  . Tinnitus    right ear   . Vitamin D deficiency 10/17/2019  . Wears glasses       Family History  Problem Relation Age of Onset  . Heart disease Mother   . Diabetes Mother   . Cancer Father        bone  . Cirrhosis Father   . Stroke Maternal Grandmother   . Stroke Maternal Grandfather   . Heart disease Sister   . Obesity Brother     Social History   Social History Narrative  . Not on file   Social History   Tobacco Use  . Smoking status: Current  Every Day Smoker    Packs/day: 1.00    Years: 40.00    Pack years: 40.00    Types: Cigarettes  . Smokeless tobacco: Never Used  Substance Use Topics  . Alcohol use: No    Alcohol/week: 0.0 standard drinks    Current Meds  Medication Sig  . amLODipine (NORVASC) 2.5 MG tablet Take 1 tablet (2.5 mg total) by mouth daily.  Marland Kitchen aspirin EC 81 MG tablet Take 81 mg by mouth every morning.   Marland Kitchen atorvastatin (LIPITOR) 80 MG tablet Take 1 tablet (80 mg total) by mouth every morning.  . Blood Pressure Monitoring (BLOOD PRESSURE MONITOR AUTOMAT) DEVI 1 Device by Does not apply route daily. Check blood pressure daily  . Cholecalciferol (VITAMIN D-3) 1000 UNITS CAPS Take 1,000 Units by mouth 2 (two) times daily.   Marland Kitchen lisinopril (ZESTRIL) 10 MG tablet Take 1 tablet (10 mg total) by mouth every morning.  . metoprolol succinate (TOPROL-XL) 50 MG 24 hr tablet Take 1 tablet (50 mg total) by mouth daily. Take with or immediately following a meal.  . Multiple Vitamin (MULTIVITAMIN WITH MINERALS) TABS tablet Take 1 tablet by mouth every morning.  . Omega-3 Fatty Acids (OMEGA 3 500 PO) Take 500  mg by mouth every morning.  . vitamin B-12 (CYANOCOBALAMIN) 1000 MCG tablet Take 5,000 mcg by mouth daily.  . vitamin E 400 UNIT capsule Take 400 Units by mouth daily.     Objective:   Today's Vitals: There were no vitals taken for this visit. Vitals with BMI 11/21/2019 11/07/2019 10/17/2019  Height (No Data) 5\' 3"  -  Weight (No Data) 165 lbs -  BMI - 123456 -  Systolic (No Data) - 0000000  Diastolic (No Data) - 88  Pulse - - -     Physical Exam  Her speech was normal on the phone.  She appeared to be alert and orientated.     Assessment   1. Essential hypertension       Tests ordered No orders of the defined types were placed in this encounter.    Plan: 1. She will continue with current antihypertensive therapy.  Blood work has been ordered and she will go and get this done in the next week or 2. 2. I  will see her in a month's time for follow-up in person.   No orders of the defined types were placed in this encounter.   Doree Albee, MD

## 2019-11-22 NOTE — Telephone Encounter (Signed)
.  Information verified with Hospice nurse.

## 2019-11-28 ENCOUNTER — Telehealth (INDEPENDENT_AMBULATORY_CARE_PROVIDER_SITE_OTHER): Payer: Self-pay | Admitting: Internal Medicine

## 2019-11-29 ENCOUNTER — Telehealth (INDEPENDENT_AMBULATORY_CARE_PROVIDER_SITE_OTHER): Payer: Self-pay

## 2019-11-30 ENCOUNTER — Ambulatory Visit (HOSPITAL_COMMUNITY): Payer: PPO | Admitting: Hematology

## 2019-11-30 ENCOUNTER — Ambulatory Visit (HOSPITAL_COMMUNITY): Admission: RE | Admit: 2019-11-30 | Payer: PPO | Source: Ambulatory Visit

## 2019-11-30 NOTE — Telephone Encounter (Signed)
Return call; she was given instruction how to use BP cuff at home. Ask to keep a log of BP daily to bring in office when she comes back.

## 2019-12-05 ENCOUNTER — Encounter: Payer: Self-pay | Admitting: Cardiology

## 2019-12-05 ENCOUNTER — Other Ambulatory Visit: Payer: Self-pay

## 2019-12-05 ENCOUNTER — Ambulatory Visit (INDEPENDENT_AMBULATORY_CARE_PROVIDER_SITE_OTHER): Payer: PPO | Admitting: Cardiology

## 2019-12-05 VITALS — BP 169/89 | HR 76 | Temp 97.3°F | Ht 63.0 in | Wt 166.0 lb

## 2019-12-05 DIAGNOSIS — E782 Mixed hyperlipidemia: Secondary | ICD-10-CM

## 2019-12-05 DIAGNOSIS — I25119 Atherosclerotic heart disease of native coronary artery with unspecified angina pectoris: Secondary | ICD-10-CM | POA: Diagnosis not present

## 2019-12-05 DIAGNOSIS — I10 Essential (primary) hypertension: Secondary | ICD-10-CM | POA: Diagnosis not present

## 2019-12-05 MED ORDER — AMLODIPINE BESYLATE 5 MG PO TABS: 5.0000 mg | ORAL_TABLET | Freq: Every day | ORAL | 3 refills | Status: DC

## 2019-12-05 NOTE — Patient Instructions (Signed)
Medication Instructions:  INCREASE NORVASC 5 MG DAILY   Labwork: NONE  Testing/Procedures: NONE  Follow-Up: Your physician wants you to follow-up in: 6 MONTHS.  You will receive a reminder letter in the mail two months in advance. If you don't receive a letter, please call our office to schedule the follow-up appointment.   Any Other Special Instructions Will Be Listed Below (If Applicable).  PLEASE KEEP A BLOOD PRESSURE LOG FOR 1 WEEK- THEN RETURN FOR DR. BRANCH TO REVIEW     If you need a refill on your cardiac medications before your next appointment, please call your pharmacy.

## 2019-12-05 NOTE — Progress Notes (Signed)
Clinical Summary Leah [name] is a 64 y.o.female seen as new consult, referred by NP Leah [name] for abnormal EKG  1. Abnormal EKG - noted by pcp, not scanned into epic    2. HTN - does not check regularly at home, just obtained home bp cuff -   3. History of CAD - epic notes mention prior stenting about 25 years ago. From further review she had an MI in 1998, received stenting to her RCA  - no recent chest pain, no SOB/DOE - sedentary lifestyle. Highest level of activity is housework, toelrates without troubles - compliant with meds   4. Hyperlipidemia - 10/2019 TC 285 HDL 59 TG 197 LDL 188 - had been off statin during 10/2019 labs, now back   Past Medical History:  Diagnosis Date  . Arthritis   . Borderline diabetes   . Breast lump on right side at 3 o'clock position    Benign, fatty mass  . Diabetes mellitus without complication [facility])    patient reports "borderline diabetes"  . History of kidney stones   . History of shingles   . Hyperlipidemia   . Hypertension   . Myocardial infarction ([place]) 1996    HAD ANGIOPLASTY WITH 2 STENTS  . Tinnitus    right ear   . Vitamin D deficiency 10/17/2019  . Wears glasses      Allergies  Allergen Reactions  . Codeine     REACTION: " Makes me high and blacked out in the 1980s"     Current Outpatient Medications  Medication Sig Dispense Refill  . amLODipine (NORVASC) 2.5 MG tablet Take 1 tablet (2.5 mg total) by mouth daily. 30 tablet 4  . aspirin EC 81 MG tablet Take 81 mg by mouth every morning.     Marland Kitchen atorvastatin (LIPITOR) 80 MG tablet Take 1 tablet (80 mg total) by mouth every morning. 30 tablet 4  . Blood Pressure Monitoring (BLOOD PRESSURE MONITOR AUTOMAT) DEVI 1 Device by Does not apply route daily. Check blood pressure daily 1 each 0  . Cholecalciferol (VITAMIN D-3) 1000 UNITS CAPS Take 1,000 Units by mouth 2 (two) times daily.     Marland Kitchen lisinopril (ZESTRIL) 10 MG tablet Take 1 tablet (10 mg total) by mouth every  morning. 30 tablet 4  . metoprolol succinate (TOPROL-XL) 50 MG 24 hr tablet Take 1 tablet (50 mg total) by mouth daily. Take with or immediately following a meal. 30 tablet 4  . Multiple Vitamin (MULTIVITAMIN WITH MINERALS) TABS tablet Take 1 tablet by mouth every morning.    . Omega-3 Fatty Acids (OMEGA 3 500 PO) Take 500 mg by mouth every morning.    . vitamin B-12 (CYANOCOBALAMIN) 1000 MCG tablet Take 5,000 mcg by mouth daily.    . vitamin E 400 UNIT capsule Take 400 Units by mouth daily.     No current facility-administered medications for this visit.     Past Surgical History:  Procedure Laterality Date  . BREAST LUMPECTOMY  >10 YRS AGO   RT BREAST  . CORONARY ANGIOPLASTY  1996   ANGIOPLASTY / 2 STENTS  . CYSTOSCOPY WITH STENT PLACEMENT Right 12/30/2015   Procedure: CYSTOSCOPY;  Surgeon: Cleon Gustin, MD;  Location: [facility];  Service: Urology;  Laterality: Right;  . CYSTOSCOPY WITH STENT PLACEMENT Right 01/09/2016   Procedure: CYSTOSCOPY WITH STENT PLACEMENT;  Surgeon: Cleon Gustin, MD;  Location: [facility];  Service: Urology;  Laterality: Right;  . HOLMIUM LASER APPLICATION Right [id number]  Procedure: HOLMIUM LASER APPLICATION;  Surgeon: Cleon Gustin, MD;  Location: [facility];  Service: Urology;  Laterality: Right;  . LITHOTRIPSY    . NEPHROLITHOTOMY Right 12/30/2015   Procedure: RIght Nephrostomy with access;  Surgeon: Cleon Gustin, MD;  Location: [facility];  Service: Urology;  Laterality: Right;  . NEPHROLITHOTOMY Right 01/09/2016   Procedure: RIGHT PERCUTANEOUS NEPHROLITHOTOMY SECOND LOOK , POSSIBLE DILATION OF TRACT;  Surgeon: Cleon Gustin, MD;  Location: [facility];  Service: Urology;  Laterality: Right;     Allergies  Allergen Reactions  . Codeine     REACTION: " Makes me high and blacked out in the 1980s"      Family History  Problem Relation Age of Onset  . Heart disease Mother   . Diabetes Mother   . Cancer Father        bone  . Cirrhosis  Father   . Stroke Maternal Grandmother   . Stroke Maternal Grandfather   . Heart disease Sister   . Obesity Brother      Social History Leah [name] reports that she has been smoking cigarettes. She has a 40.00 pack-year smoking history. She has never used smokeless tobacco. Leah [name] reports no history of alcohol use.   Review of Systems CONSTITUTIONAL: No weight loss, fever, chills, weakness or fatigue.  HEENT: Eyes: No visual loss, blurred vision, double vision or yellow sclerae.No hearing loss, sneezing, congestion, runny nose or sore throat.  SKIN: No rash or itching.  CARDIOVASCULAR: per hpi RESPIRATORY: No shortness of breath, cough or sputum.  GASTROINTESTINAL: No anorexia, nausea, vomiting or diarrhea. No abdominal pain or blood.  GENITOURINARY: No burning on urination, no polyuria NEUROLOGICAL: No headache, dizziness, syncope, paralysis, ataxia, numbness or tingling in the extremities. No change in bowel or bladder control.  MUSCULOSKELETAL: No muscle, back pain, joint pain or stiffness.  LYMPHATICS: No enlarged nodes. No history of splenectomy.  PSYCHIATRIC: No history of depression or anxiety.  ENDOCRINOLOGIC: No reports of sweating, cold or heat intolerance. No polyuria or polydipsia.  Marland Kitchen   Physical Examination Today's Vitals   12/05/19 0846 12/05/19 0852  BP: (!) 182/101 (!) 169/89  Pulse: 76   Temp: (!) 97.3 F (36.3 C)   TempSrc: Temporal   SpO2: 95%   Weight: 166 lb (75.3 kg)   Height: 5\' 3"  (1.6 m)    Body mass index is 29.41 kg/m.  Gen: resting comfortably, no acute distress HEENT: no scleral icterus, pupils equal round and reactive, no palptable cervical adenopathy,  CV: RRR, no m/r/g, no jvd Resp: Clear to auscultation bilaterally GI: abdomen is soft, non-tender, non-distended, normal bowel sounds, no hepatosplenomegaly MSK: extremities are warm, no edema.  Skin: warm, no rash Neuro:  no focal deficits Psych: appropriate affect    Assessment  and Plan  1. History of CAD - remote MI as reported above - no recent symptoms - EKG today SR, chronic ST/T changes when compared to prior EKGs - continue medical therapy  2. HTN - manual recheck 140/70, increase norvac to 5mg  daily, she will call with bp's in 1 week  3. Hyperlipidemia - just restart her statin last month, continue atorva 80mg  at this time.       Arnoldo Lenis, M.D.

## 2019-12-06 ENCOUNTER — Other Ambulatory Visit (INDEPENDENT_AMBULATORY_CARE_PROVIDER_SITE_OTHER): Payer: Self-pay | Admitting: Nurse Practitioner

## 2019-12-06 ENCOUNTER — Encounter (INDEPENDENT_AMBULATORY_CARE_PROVIDER_SITE_OTHER): Payer: Self-pay | Admitting: Nurse Practitioner

## 2019-12-06 ENCOUNTER — Ambulatory Visit (INDEPENDENT_AMBULATORY_CARE_PROVIDER_SITE_OTHER): Payer: PPO | Admitting: Nurse Practitioner

## 2019-12-06 VITALS — BP 160/82 | HR 74 | Temp 97.6°F | Resp 12 | Ht 63.0 in | Wt 163.2 lb

## 2019-12-06 DIAGNOSIS — E785 Hyperlipidemia, unspecified: Secondary | ICD-10-CM | POA: Diagnosis not present

## 2019-12-06 DIAGNOSIS — I10 Essential (primary) hypertension: Secondary | ICD-10-CM

## 2019-12-06 DIAGNOSIS — D751 Secondary polycythemia: Secondary | ICD-10-CM | POA: Diagnosis not present

## 2019-12-06 NOTE — Progress Notes (Signed)
Subjective:  Patient ID: Leah [name], female    DOB: Nov 15, 1956  Age: 64 y.o. MRN: [id number]  CC:  Chief Complaint  Patient presents with  . Follow-up  . Hypertension      HPI  This patient arrives today for lab follow-up.  However I do see where she was seen a couple weeks ago and blood pressure medication was changed.  So I also performed a an office visit today.  She tells me she also saw her cardiologist yesterday, and he increased her amlodipine from 5 mg to 10 mg.  She took the 10 mg dose the first time today.  She also continues on lisinopril and metoprolol.  She does have an at home blood pressure cuff, but has some difficulty using it.  Hypertension Pertinent negatives include no blurred vision, chest pain, headaches, malaise/fatigue, palpitations or shortness of breath.    Past Medical History:  Diagnosis Date  . Arthritis   . Borderline diabetes   . Breast lump on right side at 3 o'clock position    Benign, fatty mass  . Diabetes mellitus without complication [facility])    patient reports "borderline diabetes"  . History of kidney stones   . History of shingles   . Hyperlipidemia   . Hypertension   . Myocardial infarction ([place]) 1996    HAD ANGIOPLASTY WITH 2 STENTS  . Tinnitus    right ear   . Vitamin D deficiency 10/17/2019  . Wears glasses       Family History  Problem Relation Age of Onset  . Heart disease Mother   . Diabetes Mother   . Cancer Father        bone  . Cirrhosis Father   . Stroke Maternal Grandmother   . Stroke Maternal Grandfather   . Heart disease Sister   . Obesity Brother     Social History   Social History Narrative  . Not on file   Social History   Tobacco Use  . Smoking status: Current Every Day Smoker    Packs/day: 1.00    Years: 40.00    Pack years: 40.00    Types: Cigarettes  . Smokeless tobacco: Never Used  Substance Use Topics  . Alcohol use: No    Alcohol/week: 0.0 standard drinks     Current Meds   Medication Sig  . amLODipine (NORVASC) 5 MG tablet Take 1 tablet (5 mg total) by mouth daily. (Patient taking differently: Take 10 mg by mouth daily. )  . aspirin EC 81 MG tablet Take 81 mg by mouth every morning.   Marland Kitchen atorvastatin (LIPITOR) 80 MG tablet Take 1 tablet (80 mg total) by mouth every morning.  . Cholecalciferol (VITAMIN D-3) 1000 UNITS CAPS Take 1,000 Units by mouth 2 (two) times daily.   Marland Kitchen lisinopril (ZESTRIL) 10 MG tablet Take 1 tablet (10 mg total) by mouth every morning.  . metoprolol succinate (TOPROL-XL) 50 MG 24 hr tablet Take 1 tablet (50 mg total) by mouth daily. Take with or immediately following a meal.  . Multiple Vitamin (MULTIVITAMIN WITH MINERALS) TABS tablet Take 1 tablet by mouth every morning.  . Omega-3 Fatty Acids (OMEGA 3 500 PO) Take 500 mg by mouth every morning.  . vitamin B-12 (CYANOCOBALAMIN) 1000 MCG tablet Take 5,000 mcg by mouth daily.  . vitamin E 400 UNIT capsule Take 400 Units by mouth daily.    ROS:  Review of Systems  Constitutional: Negative for malaise/fatigue.  Eyes: Negative for  blurred vision.  Respiratory: Negative for shortness of breath and wheezing.   Cardiovascular: Negative for chest pain and palpitations.  Neurological: Negative for dizziness and headaches.     Objective:   Today's Vitals: Pulse 74   Temp 97.6 F (36.4 C)   Resp 12   Ht 5\' 3"  (1.6 m)   Wt 163 lb 3.2 oz (74 kg)   SpO2 99%   BMI 28.91 kg/m  Vitals with BMI 12/06/2019 12/05/2019 12/05/2019  Height 5\' 3"  - 5\' 3"   Weight 163 lbs 3 oz - 166 lbs  BMI [id number] - [id number]  Systolic - [id number] [id number]  Diastolic - 89 [id number]  Pulse 74 - 76     Physical Exam Vitals reviewed.  Constitutional:      General: She is not in acute distress.    Appearance: Normal appearance.  HENT:     Head: Normocephalic and atraumatic.  Neck:     Vascular: No carotid bruit.  Cardiovascular:     Rate and Rhythm: Normal rate and regular rhythm.     Pulses: Normal pulses.     Heart sounds:  Normal heart sounds.  Pulmonary:     Effort: Pulmonary effort is normal.     Breath sounds: Normal breath sounds.  Skin:    General: Skin is warm and dry.  Neurological:     General: No focal deficit present.     Mental Status: She is alert and oriented to person, place, and time.  Psychiatric:        Mood and Affect: Mood normal.        Behavior: Behavior normal.        Judgment: Judgment normal.          Assessment   No diagnosis found.    Tests ordered No orders of the defined types were placed in this encounter.    Plan: Please see assessment and plan per problem list below.   No orders of the defined types were placed in this encounter.   Patient to follow-up in 2 weeks as scheduled.  Ailene Ards, NP

## 2019-12-06 NOTE — Assessment & Plan Note (Signed)
Blood pressure is still above goal today.  Since she just started taking her amlodipine today I am not going to further titrate her medications up at this time.  However I see that she is scheduled for follow-up in about 2 weeks.  I will keep this follow-up and if her blood pressure remains elevated we will need to consider increasing her blood pressure medications.  I encouraged her to use her blood pressure cuff at home.  We did discuss the proper technique of checking blood pressure and that she should keep a log.  She tells me she is doing this already for Dr. Harl Bowie, and I encouraged her to continue doing this.  I told her that her goal is to be at least less than 140 over less than 90, however she would most likely benefit from blood pressure of less than 130 over less than 90.  I did provide her with a list of red flag symptoms so she knows when to go to the emergency department.

## 2019-12-06 NOTE — Patient Instructions (Signed)
Thank you for choosing [facility] as your medical provider! If you have any questions or concerns regarding your health care, please do not hesitate to call our office.   Continue all medications as prescribed. Continue monitoring your BP at home. Your goal is <140/<90. Any chest pain, difficulty breathing, sensation changes, or weakness requires immediate medical care at the ER.   Please follow-up as scheduled in 2 weeks. We look forward to seeing you again soon! !  At [facility] we value your feedback. You may receive a survey about your visit today. Please share your experience as we strive to create trusting relationships with our patients to provide genuine, compassionate, quality care.  We appreciate your understanding and patience as we review any laboratory studies, imaging, and other diagnostic tests that are ordered as we care for you. We do our best to address any and all results in a timely manner. If you do not hear about test results within 1 week, please do not hesitate to contact us. If we referred you to a specialist during your visit or ordered imaging testing, contact the office if you have not been contacted to be scheduled within 1 weeks.  We also encourage the use of MyChart, which contains your medical information for your review as well. If you are not enrolled in this feature, an access code is on this after visit summary for your convenience. Thank you for allowing [place] to be involved in your care.

## 2019-12-07 ENCOUNTER — Ambulatory Visit (INDEPENDENT_AMBULATORY_CARE_PROVIDER_SITE_OTHER): Payer: PPO | Admitting: Nurse Practitioner

## 2019-12-07 LAB — CBC WITH DIFFERENTIAL/PLATELET
Absolute Monocytes: 360 cells/uL (ref 200–950)
Basophils Absolute: 43 cells/uL (ref 0–200)
Basophils Relative: 0.7 %
Eosinophils Absolute: 61 cells/uL (ref 15–500)
Eosinophils Relative: 1 %
HCT: 45.2 % — ABNORMAL HIGH (ref 35.0–45.0)
Hemoglobin: 15.2 g/dL (ref 11.7–15.5)
Lymphs Abs: 2525 cells/uL (ref 850–3900)
MCH: 30 pg (ref 27.0–33.0)
MCHC: 33.6 g/dL (ref 32.0–36.0)
MCV: 89.2 fL (ref 80.0–100.0)
MPV: 11.2 fL (ref 7.5–12.5)
Monocytes Relative: 5.9 %
Neutro Abs: 3111 cells/uL (ref 1500–7800)
Neutrophils Relative %: 51 %
Platelets: 197 10*3/uL (ref 140–400)
RBC: 5.07 10*6/uL (ref 3.80–5.10)
RDW: 13.9 % (ref 11.0–15.0)
Total Lymphocyte: 41.4 %
WBC: 6.1 10*3/uL (ref 3.8–10.8)

## 2019-12-07 LAB — LIPID PANEL
Cholesterol: 146 mg/dL (ref <200)
HDL: 47 mg/dL — ABNORMAL LOW (ref >=50)
LDL Cholesterol (Calc): 83 mg/dL (calc)
Non-HDL Cholesterol (Calc): 99 mg/dL (calc) (ref <130)
Total CHOL/HDL Ratio: 3.1 (calc) (ref <5.0)
Triglycerides: 76 mg/dL (ref <150)

## 2019-12-07 LAB — COMPLETE METABOLIC PANEL WITH GFR
AG Ratio: 1.3 (calc) (ref 1.0–2.5)
ALT: 12 U/L (ref 6–29)
AST: 16 U/L (ref 10–35)
Albumin: 3.8 g/dL (ref 3.6–5.1)
Alkaline phosphatase (APISO): 147 U/L (ref 37–153)
BUN: 7 mg/dL (ref 7–25)
CO2: 27 mmol/L (ref 20–32)
Calcium: 9.2 mg/dL (ref 8.6–10.4)
Chloride: 106 mmol/L (ref 98–110)
Creat: 0.89 mg/dL (ref 0.50–0.99)
GFR, Est African American: 79 mL/min/{1.73_m2} (ref >=60)
GFR, Est Non African American: 68 mL/min/{1.73_m2} (ref >=60)
Globulin: 2.9 g/dL (calc) (ref 1.9–3.7)
Glucose, Bld: 117 mg/dL — ABNORMAL HIGH (ref 65–99)
Potassium: 4.2 mmol/L (ref 3.5–5.3)
Sodium: 141 mmol/L (ref 135–146)
Total Bilirubin: 0.4 mg/dL (ref 0.2–1.2)
Total Protein: 6.7 g/dL (ref 6.1–8.1)

## 2019-12-07 LAB — PATHOLOGIST SMEAR REVIEW

## 2019-12-11 ENCOUNTER — Encounter (HOSPITAL_COMMUNITY): Payer: Self-pay

## 2019-12-11 ENCOUNTER — Ambulatory Visit (HOSPITAL_COMMUNITY): Payer: PPO | Admitting: Hematology

## 2019-12-12 ENCOUNTER — Encounter (HOSPITAL_COMMUNITY): Payer: Self-pay | Admitting: Hematology

## 2019-12-12 ENCOUNTER — Other Ambulatory Visit (INDEPENDENT_AMBULATORY_CARE_PROVIDER_SITE_OTHER): Payer: Self-pay | Admitting: Internal Medicine

## 2019-12-12 ENCOUNTER — Other Ambulatory Visit: Payer: Self-pay

## 2019-12-12 ENCOUNTER — Inpatient Hospital Stay (HOSPITAL_COMMUNITY): Payer: PPO | Attending: Hematology | Admitting: Hematology

## 2019-12-12 ENCOUNTER — Inpatient Hospital Stay (HOSPITAL_COMMUNITY): Payer: PPO

## 2019-12-12 ENCOUNTER — Telehealth (INDEPENDENT_AMBULATORY_CARE_PROVIDER_SITE_OTHER): Payer: Self-pay

## 2019-12-12 VITALS — BP 173/73 | HR 98 | Temp 96.9°F | Resp 18 | Ht 63.0 in | Wt 160.9 lb

## 2019-12-12 DIAGNOSIS — I10 Essential (primary) hypertension: Secondary | ICD-10-CM | POA: Insufficient documentation

## 2019-12-12 DIAGNOSIS — F17210 Nicotine dependence, cigarettes, uncomplicated: Secondary | ICD-10-CM | POA: Insufficient documentation

## 2019-12-12 DIAGNOSIS — Z79899 Other long term (current) drug therapy: Secondary | ICD-10-CM | POA: Insufficient documentation

## 2019-12-12 DIAGNOSIS — D751 Secondary polycythemia: Secondary | ICD-10-CM | POA: Diagnosis not present

## 2019-12-12 DIAGNOSIS — Z7982 Long term (current) use of aspirin: Secondary | ICD-10-CM | POA: Insufficient documentation

## 2019-12-12 DIAGNOSIS — E559 Vitamin D deficiency, unspecified: Secondary | ICD-10-CM | POA: Insufficient documentation

## 2019-12-12 DIAGNOSIS — Z122 Encounter for screening for malignant neoplasm of respiratory organs: Secondary | ICD-10-CM

## 2019-12-12 LAB — CBC WITH DIFFERENTIAL/PLATELET
Abs Immature Granulocytes: 0.01 10*3/uL (ref 0.00–0.07)
Basophils Absolute: 0.1 10*3/uL (ref 0.0–0.1)
Basophils Relative: 1 %
Eosinophils Absolute: 0 10*3/uL (ref 0.0–0.5)
Eosinophils Relative: 1 %
HCT: 45.6 % (ref 36.0–46.0)
Hemoglobin: 14.9 g/dL (ref 12.0–15.0)
Immature Granulocytes: 0 %
Lymphocytes Relative: 39 %
Lymphs Abs: 2.6 10*3/uL (ref 0.7–4.0)
MCH: 29.7 pg (ref 26.0–34.0)
MCHC: 32.7 g/dL (ref 30.0–36.0)
MCV: 90.8 fL (ref 80.0–100.0)
Monocytes Absolute: 0.3 10*3/uL (ref 0.1–1.0)
Monocytes Relative: 5 %
Neutro Abs: 3.6 10*3/uL (ref 1.7–7.7)
Neutrophils Relative %: 54 %
Platelets: 214 10*3/uL (ref 150–400)
RBC: 5.02 MIL/uL (ref 3.87–5.11)
RDW: 13.8 % (ref 11.5–15.5)
WBC: 6.7 10*3/uL (ref 4.0–10.5)
nRBC: 0 % (ref 0.0–0.2)

## 2019-12-12 LAB — COMPREHENSIVE METABOLIC PANEL
ALT: 19 U/L (ref 0–44)
AST: 20 U/L (ref 15–41)
Albumin: 3.7 g/dL (ref 3.5–5.0)
Alkaline Phosphatase: 130 U/L — ABNORMAL HIGH (ref 38–126)
Anion gap: 7 (ref 5–15)
BUN: 8 mg/dL (ref 8–23)
CO2: 28 mmol/L (ref 22–32)
Calcium: 9.4 mg/dL (ref 8.9–10.3)
Chloride: 103 mmol/L (ref 98–111)
Creatinine, Ser: 0.82 mg/dL (ref 0.44–1.00)
GFR calc Af Amer: >60 mL/min (ref >60)
GFR calc non Af Amer: >60 mL/min (ref >60)
Glucose, Bld: 110 mg/dL — ABNORMAL HIGH (ref 70–99)
Potassium: 4.3 mmol/L (ref 3.5–5.1)
Sodium: 138 mmol/L (ref 135–145)
Total Bilirubin: 0.6 mg/dL (ref 0.3–1.2)
Total Protein: 7.1 g/dL (ref 6.5–8.1)

## 2019-12-12 LAB — LACTATE DEHYDROGENASE: LDH: 137 U/L (ref 98–192)

## 2019-12-12 NOTE — Patient Instructions (Signed)
[facility] Discharge Instructions  Follow up in 2-3 weeks   Thank you for choosing [facility] at [facility] to provide your oncology and hematology care.  To afford each patient quality time with our provider, please arrive at least 15 minutes before your scheduled appointment time.   If you have a lab appointment with the Cancer Center please come in thru the Main Entrance and check in at the main information desk.  You need to re-schedule your appointment should you arrive 10 or more minutes late.  We strive to give you quality time with our providers, and arriving late affects you and other patients whose appointments are after yours.  Also, if you no show three or more times for appointments you may be dismissed from the clinic at the providers discretion.     Again, thank you for choosing [facility].  Our hope is that these requests will decrease the amount of time that you wait before being seen by our physicians.       _____________________________________________________________  Should you have questions after your visit to [facility], please contact our office at [phone] between the hours of 8:00 a.m. and 4:30 p.m.  Voicemails left after 4:00 p.m. will not be returned until the following business day.  For prescription refill requests, have your pharmacy contact our office and allow 72 hours.    Due to Covid, you will need to wear a mask upon entering the hospital. If you do not have a mask, a mask will be given to you at the Main Entrance upon arrival. For doctor visits, patients may have 1 support person with them. For treatment visits, patients can not have anyone with them due to social distancing guidelines and our immunocompromised population.      

## 2019-12-12 NOTE — Progress Notes (Signed)
CONSULT NOTE  Patient Care Team: Doree Albee, MD as PCP - General (Internal Medicine)  CHIEF COMPLAINTS/PURPOSE OF CONSULTATION: Polycythemia  HISTORY OF PRESENTING ILLNESS:  Leah [name] 64 y.o. female was sent here by her PCP for polycythemia.  Patient has had elevated hemoglobin and hematocrit off and on since 2009.  Over the past few lab checks it has steady increased.  She was sent for a consult.  Patient does report she is an every day smoker for the past 40 years.  She smokes approximately a pack per day.  She denies any diagnosis of COPD asthma or obstructive sleep apnea.  She denies any diuretics antibiotics or steroid usage.  Patient denies any B symptoms.  Patient denies any history of any blood clots.  She denies recent chest pain on exertion, shortness of breath on minimal exertion, presyncopal episodes, or palpitations.  She has not noticed any recent bleeding such as epistasis, hematuria or hematochezia.  She has no history of donating blood or receiving blood transfusions. She has no prior history or diagnosis of cancer.  Her age-appropriate screenings are up-to-date. She has a family history of a father with bone cancer. She worked most of her adult life in [employer] with no exposure to any harmful chemicals.    MEDICAL HISTORY:  Past Medical History:  Diagnosis Date  . Arthritis   . Borderline diabetes   . Breast lump on right side at 3 o'clock position    Benign, fatty mass  . History of kidney stones   . History of shingles   . Hyperlipidemia   . Hypertension   . Myocardial infarction ([place]) 1996    HAD ANGIOPLASTY WITH 2 STENTS  . Tinnitus    right ear   . Vitamin D deficiency 10/17/2019  . Wears glasses     SURGICAL HISTORY: Past Surgical History:  Procedure Laterality Date  . BREAST LUMPECTOMY  >10 YRS AGO   RT BREAST  . CORONARY ANGIOPLASTY  1996   ANGIOPLASTY / 2 STENTS  . CYSTOSCOPY WITH STENT PLACEMENT Right 12/30/2015   Procedure:  CYSTOSCOPY;  Surgeon: Cleon Gustin, MD;  Location: [facility];  Service: Urology;  Laterality: Right;  . CYSTOSCOPY WITH STENT PLACEMENT Right 01/09/2016   Procedure: CYSTOSCOPY WITH STENT PLACEMENT;  Surgeon: Cleon Gustin, MD;  Location: [facility];  Service: Urology;  Laterality: Right;  . HOLMIUM LASER APPLICATION Right 5/37/4827   Procedure: HOLMIUM LASER APPLICATION;  Surgeon: Cleon Gustin, MD;  Location: [facility];  Service: Urology;  Laterality: Right;  . LITHOTRIPSY    . NEPHROLITHOTOMY Right 12/30/2015   Procedure: RIght Nephrostomy with access;  Surgeon: Cleon Gustin, MD;  Location: [facility];  Service: Urology;  Laterality: Right;  . NEPHROLITHOTOMY Right 01/09/2016   Procedure: RIGHT PERCUTANEOUS NEPHROLITHOTOMY SECOND LOOK , POSSIBLE DILATION OF TRACT;  Surgeon: Cleon Gustin, MD;  Location: [facility];  Service: Urology;  Laterality: Right;    SOCIAL HISTORY: Social History   Socioeconomic History  . Marital status: Widowed    Spouse name: Not on file  . Number of children: 1  . Years of education: Not on file  . Highest education level: Not on file  Occupational History  . Occupation: Retired  Tobacco Use  . Smoking status: Current Every Day Smoker    Packs/day: 1.00    Years: 40.00    Pack years: 40.00    Types: Cigarettes  . Smokeless tobacco: Never Used  Substance and Sexual  Activity  . Alcohol use: No    Alcohol/week: 0.0 standard drinks  . Drug use: No  . Sexual activity: Not Currently  Other Topics Concern  . Not on file  Social History Narrative  . Not on file   Social Determinants of Health   Financial Resource Strain:   . Difficulty of Paying Living Expenses: Not on file  Food Insecurity:   . Worried About Charity fundraiser in the Last Year: Not on file  . Ran Out of Food in the Last Year: Not on file  Transportation Needs:   . Lack of Transportation (Medical): Not on file  . Lack of Transportation (Non-Medical): Not on file   Physical Activity:   . Days of Exercise per Week: Not on file  . Minutes of Exercise per Session: Not on file  Stress:   . Feeling of Stress : Not on file  Social Connections:   . Frequency of Communication with Friends and Family: Not on file  . Frequency of Social Gatherings with Friends and Family: Not on file  . Attends Religious Services: Not on file  . Active Member of Clubs or Organizations: Not on file  . Attends Archivist Meetings: Not on file  . Marital Status: Not on file  Intimate Partner Violence:   . Fear of Current or Ex-Partner: Not on file  . Emotionally Abused: Not on file  . Physically Abused: Not on file  . Sexually Abused: Not on file    FAMILY HISTORY: Family History  Problem Relation Age of Onset  . Heart disease Mother   . Diabetes Mother   . Cancer Father        bone  . Cirrhosis Father   . Stroke Maternal Grandmother   . Stroke Maternal Grandfather   . Heart disease Sister   . Obesity Brother   . Healthy Daughter     ALLERGIES:  is allergic to codeine.  MEDICATIONS:  Current Outpatient Medications  Medication Sig Dispense Refill  . amLODipine (NORVASC) 5 MG tablet Take 1 tablet (5 mg total) by mouth daily. (Patient taking differently: Take 10 mg by mouth daily. ) 90 tablet 3  . aspirin EC 81 MG tablet Take 81 mg by mouth every morning.     Marland Kitchen atorvastatin (LIPITOR) 80 MG tablet Take 1 tablet (80 mg total) by mouth every morning. 30 tablet 4  . Blood Pressure Monitoring (BLOOD PRESSURE MONITOR AUTOMAT) DEVI 1 Device by Does not apply route daily. Check blood pressure daily 1 each 0  . Cholecalciferol (VITAMIN D-3) 1000 UNITS CAPS Take 1,000 Units by mouth 2 (two) times daily.     Marland Kitchen lisinopril (ZESTRIL) 10 MG tablet Take 1 tablet (10 mg total) by mouth every morning. 30 tablet 4  . metoprolol succinate (TOPROL-XL) 50 MG 24 hr tablet Take 1 tablet (50 mg total) by mouth daily. Take with or immediately following a meal. 30 tablet 4  .  Multiple Vitamin (MULTIVITAMIN WITH MINERALS) TABS tablet Take 1 tablet by mouth every morning.    . Omega-3 Fatty Acids (OMEGA 3 500 PO) Take 500 mg by mouth every morning.    . vitamin B-12 (CYANOCOBALAMIN) 1000 MCG tablet Take 5,000 mcg by mouth daily.    . vitamin E 400 UNIT capsule Take 400 Units by mouth daily.     No current facility-administered medications for this visit.    REVIEW OF SYSTEMS:   Constitutional: Denies fevers, chills or abnormal night sweats Eyes: Denies  blurriness of vision, double vision or watery eyes Ears, nose, mouth, throat, and face: Denies mucositis or sore throat Respiratory: Denies cough, dyspnea or wheezes Cardiovascular: Denies palpitation, chest discomfort or lower extremity swelling Gastrointestinal:  Denies nausea, heartburn or change in bowel habits Skin: Denies abnormal skin rashes Lymphatics: Denies new lymphadenopathy or easy bruising Neurological:Denies numbness, tingling or new weaknesses Behavioral/Psych: Mood is stable, no new changes  All other systems were reviewed with the patient and are negative.  PHYSICAL EXAMINATION: ECOG PERFORMANCE STATUS: 0 - Asymptomatic  Vitals:   12/12/19 1354  BP: (!) 173/73  Pulse: 98  Resp: 18  Temp: (!) 96.9 F (36.1 C)  SpO2: 100%   Filed Weights   12/12/19 1354  Weight: 160 lb 14.4 oz (73 kg)    GENERAL:alert, no distress and comfortable SKIN: skin color, texture, turgor are normal, no rashes or significant lesions NECK: supple, thyroid normal size, non-tender, without nodularity LYMPH:  no palpable lymphadenopathy in the cervical, axillary or inguinal LUNGS: clear to auscultation and percussion with normal breathing effort HEART: regular rate & irregular rhythm and no lower extremity edema ABDOMEN:abdomen soft, non-tender and normal bowel sounds Musculoskeletal:no cyanosis of digits and no clubbing  PSYCH: alert & oriented x 3 with fluent speech NEURO: no focal motor/sensory  deficits  LABORATORY DATA:  I have reviewed the data as listed Recent Results (from the past 2160 hour(s))  CBC     Status: Abnormal   Collection Time: 10/17/19 11:30 AM  Result Value Ref Range   WBC 6.3 3.8 - 10.8 Thousand/uL   RBC 5.45 (H) 3.80 - 5.10 Million/uL   Hemoglobin 16.3 (H) 11.7 - 15.5 g/dL   HCT 48.3 (H) 35.0 - 45.0 %   MCV 88.6 80.0 - 100.0 fL   MCH 29.9 27.0 - 33.0 pg   MCHC 33.7 32.0 - 36.0 g/dL   RDW 14.4 11.0 - 15.0 %   Platelets 268 140 - 400 Thousand/uL   MPV 11.3 7.5 - 12.5 fL  Comprehensive metabolic panel     Status: Abnormal   Collection Time: 10/17/19 11:30 AM  Result Value Ref Range   Glucose, Bld 86 65 - 99 mg/dL    Comment: .            Fasting reference interval .    BUN 7 7 - 25 mg/dL   Creat 1.01 (H) 0.50 - 0.99 mg/dL    Comment: For patients >5 years of age, the reference limit for Creatinine is approximately 13% higher for people identified as African-American. .    BUN/Creatinine Ratio 7 6 - 22 (calc)   Sodium 139 135 - 146 mmol/L   Potassium 4.0 3.5 - 5.3 mmol/L   Chloride 101 98 - 110 mmol/L   CO2 24 20 - 32 mmol/L   Calcium 10.0 8.6 - 10.4 mg/dL   Total Protein 7.3 6.1 - 8.1 g/dL   Albumin 4.0 3.6 - 5.1 g/dL   Globulin 3.3 1.9 - 3.7 g/dL (calc)   AG Ratio 1.2 1.0 - 2.5 (calc)   Total Bilirubin 0.3 0.2 - 1.2 mg/dL   Alkaline phosphatase (APISO) 147 37 - 153 U/L   AST 18 10 - 35 U/L   ALT 12 6 - 29 U/L  Hemoglobin A1c     Status: None   Collection Time: 10/17/19 11:30 AM  Result Value Ref Range   Hgb A1c MFr Bld 5.6 <5.7 % of total Hgb    Comment: For the purpose of screening for  the presence of diabetes: . <5.7%       Consistent with the absence of diabetes 5.7-6.4%    Consistent with increased risk for diabetes             (prediabetes) > or =6.5%  Consistent with diabetes . This assay result is consistent with a decreased risk of diabetes. . Currently, no consensus exists regarding use of hemoglobin A1c for  diagnosis of diabetes in children. . According to American Diabetes Association (ADA) guidelines, hemoglobin A1c <7.0% represents optimal control in non-pregnant diabetic patients. Different metrics may apply to specific patient populations.  Standards of Medical Care in Diabetes(ADA). .    Mean Plasma Glucose 114 (calc)   eAG (mmol/L) 6.3 (calc)  Lipid panel     Status: Abnormal   Collection Time: 10/17/19 11:30 AM  Result Value Ref Range   Cholesterol 285 (H) <200 mg/dL   HDL 59 > OR = 50 mg/dL   Triglycerides 197 (H) <150 mg/dL   LDL Cholesterol (Calc) 188 (H) mg/dL (calc)    Comment: Reference range: <100 . Desirable range <100 mg/dL for primary prevention;   <70 mg/dL for patients with CHD or diabetic patients  with > or = 2 CHD risk factors. Marland Kitchen LDL-C is now calculated using the Martin-Hopkins  calculation, which is a validated novel method providing  better accuracy than the Friedewald equation in the  estimation of LDL-C.  Cresenciano Genre et al. Annamaria Helling. 3151;761(60): 2061-2068  (http://education.[facility].com/faq/FAQ164)    Total CHOL/HDL Ratio 4.8 <5.0 (calc)   Non-HDL Cholesterol (Calc) 226 (H) <130 mg/dL (calc)    Comment: Non-HDL level > or = 220 is very high and may indicate  genetic familial hypercholesterolemia (FH). Clinical  assessment and measurement of blood lipid levels  should be considered for all first-degree relatives  of patients with an FH diagnosis. . For patients with diabetes plus 1 major ASCVD risk  factor, treating to a non-HDL-C goal of <100 mg/dL  (LDL-C of <70 mg/dL) is considered a therapeutic  option.   Hepatitis C antibody screen     Status: None   Collection Time: 10/17/19 11:30 AM  Result Value Ref Range   Hepatitis C Ab NON-REACTIVE NON-REACTI   SIGNAL TO CUT-OFF 0.07 <1.00    Comment: . HCV antibody was non-reactive. There is no laboratory  evidence of HCV infection. . In most cases, no further action is required. However, if  recent HCV exposure is suspected, a test for HCV RNA (test code [id number]) is suggested. . For additional information please refer to http://education.[facility].com/faq/FAQ22v1 (This link is being provided for informational/ educational purposes only.) .   TSH     Status: None   Collection Time: 10/17/19 11:30 AM  Result Value Ref Range   TSH 1.94 0.40 - 4.50 mIU/L  VITAMIN D 25 Hydroxy (Vit-D Deficiency, Fractures)     Status: None   Collection Time: 10/17/19 11:30 AM  Result Value Ref Range   Vit D, 25-Hydroxy 42 30 - 100 ng/mL    Comment: Vitamin D Status         25-OH Vitamin D: . Deficiency:                    <20 ng/mL Insufficiency:             20 - 29 ng/mL Optimal:                 > or = 30 ng/mL . For 25-OH Vitamin D  testing on patients on  D2-supplementation and patients for whom quantitation  of D2 and D3 fractions is required, the [facility](TM) 25-OH VIT D, (D2,D3), LC/MS/MS is recommended: order  code [id number] (patients >63yr). See Note 1 . Note 1 . For additional information, please refer to  http://education.[facility].com/faq/FAQ199  (This link is being provided for informational/ educational purposes only.)   T4, free     Status: None   Collection Time: 10/17/19 11:30 AM  Result Value Ref Range   Free T4 1.2 0.8 - 1.8 ng/dL  T3, free     Status: None   Collection Time: 10/17/19 11:30 AM  Result Value Ref Range   T3, Free 2.9 2.3 - 4.2 pg/mL  Pathologist smear review     Status: None   Collection Time: 12/06/19 11:02 AM  Result Value Ref Range   Path Review      Comment: Myeloid population consists predominantly of mature segmented neutrophils. A few lymphocytes appear reactive. No immature cells are identified. Review of the peripheral smear reveals adequate numbers of platelets. RBCs appear within normal limits. Reviewed by MFrancis GainesMRockne Coons MD  (Electronic Signature on [phone]   CBC with Differential/Platelets      Status: Abnormal   Collection Time: 12/06/19 11:02 AM  Result Value Ref Range   WBC 6.1 3.8 - 10.8 Thousand/uL   RBC 5.07 3.80 - 5.10 Million/uL   Hemoglobin 15.2 11.7 - 15.5 g/dL   HCT 45.2 (H) 35.0 - 45.0 %   MCV 89.2 80.0 - 100.0 fL   MCH 30.0 27.0 - 33.0 pg   MCHC 33.6 32.0 - 36.0 g/dL   RDW 13.9 11.0 - 15.0 %   Platelets 197 140 - 400 Thousand/uL   MPV 11.2 7.5 - 12.5 fL   Neutro Abs 3,111 1,500 - 7,800 cells/uL   Lymphs Abs 2,525 850 - 3,900 cells/uL   Absolute Monocytes 360 200 - 950 cells/uL   Eosinophils Absolute 61 15 - 500 cells/uL   Basophils Absolute 43 0 - 200 cells/uL   Neutrophils Relative % 51 %   Total Lymphocyte 41.4 %   Monocytes Relative 5.9 %   Eosinophils Relative 1.0 %   Basophils Relative 0.7 %  CMP with eGFR([facility])     Status: Abnormal   Collection Time: 12/06/19 11:02 AM  Result Value Ref Range   Glucose, Bld 117 (H) 65 - 99 mg/dL    Comment: .            Fasting reference interval . For someone without known diabetes, a glucose value between 100 and 125 mg/dL is consistent with prediabetes and should be confirmed with a follow-up test. .    BUN 7 7 - 25 mg/dL   Creat 0.89 0.50 - 0.99 mg/dL    Comment: For patients >436years of age, the reference limit for Creatinine is approximately 13% higher for people identified as African-American. .    GFR, Est Non African American 68 > OR = 60 mL/min/1.771m  GFR, Est African American 79 > OR = 60 mL/min/1.7353m BUN/Creatinine Ratio NOT APPLICABLE 6 - 22 (calc)   Sodium 141 135 - 146 mmol/L   Potassium 4.2 3.5 - 5.3 mmol/L   Chloride 106 98 - 110 mmol/L   CO2 27 20 - 32 mmol/L   Calcium 9.2 8.6 - 10.4 mg/dL   Total Protein 6.7 6.1 - 8.1 g/dL   Albumin 3.8 3.6 - 5.1 g/dL   Globulin 2.9 1.9 -  3.7 g/dL (calc)   AG Ratio 1.3 1.0 - 2.5 (calc)   Total Bilirubin 0.4 0.2 - 1.2 mg/dL   Alkaline phosphatase (APISO) 147 37 - 153 U/L   AST 16 10 - 35 U/L   ALT 12 6 - 29 U/L  Lipid Panel     Status:  Abnormal   Collection Time: 12/06/19 11:02 AM  Result Value Ref Range   Cholesterol 146 <200 mg/dL   HDL 47 (L) > OR = 50 mg/dL   Triglycerides 76 <150 mg/dL   LDL Cholesterol (Calc) 83 mg/dL (calc)    Comment: Reference range: <100 . Desirable range <100 mg/dL for primary prevention;   <70 mg/dL for patients with CHD or diabetic patients  with > or = 2 CHD risk factors. Marland Kitchen LDL-C is now calculated using the Martin-Hopkins  calculation, which is a validated novel method providing  better accuracy than the Friedewald equation in the  estimation of LDL-C.  Cresenciano Genre et al. Annamaria Helling. 7416;384(53): 2061-2068  (http://education.[facility].com/faq/FAQ164)    Total CHOL/HDL Ratio 3.1 <5.0 (calc)   Non-HDL Cholesterol (Calc) 99 <130 mg/dL (calc)    Comment: For patients with diabetes plus 1 major ASCVD risk  factor, treating to a non-HDL-C goal of <100 mg/dL  (LDL-C of <70 mg/dL) is considered a therapeutic  option.   Lactate dehydrogenase     Status: None   Collection Time: 12/12/19  3:25 PM  Result Value Ref Range   LDH 137 98 - 192 U/L    Comment: Performed at [facility], [address], [place], [place] [address]  Comprehensive metabolic panel     Status: Abnormal   Collection Time: 12/12/19  3:25 PM  Result Value Ref Range   Sodium 138 135 - 145 mmol/L   Potassium 4.3 3.5 - 5.1 mmol/L   Chloride 103 98 - 111 mmol/L   CO2 28 22 - 32 mmol/L   Glucose, Bld 110 (H) 70 - 99 mg/dL   BUN 8 8 - 23 mg/dL   Creatinine, Ser 0.82 0.44 - 1.00 mg/dL   Calcium 9.4 8.9 - 10.3 mg/dL   Total Protein 7.1 6.5 - 8.1 g/dL   Albumin 3.7 3.5 - 5.0 g/dL   AST 20 15 - 41 U/L   ALT 19 0 - 44 U/L   Alkaline Phosphatase 130 (H) 38 - 126 U/L   Total Bilirubin 0.6 0.3 - 1.2 mg/dL   GFR calc non Af Amer >60 >60 mL/min   GFR calc Af Amer >60 >60 mL/min   Anion gap 7 5 - 15    Comment: Performed at [facility], [address] [place], [place] [address]  CBC with Differential/Platelet     Status:  None   Collection Time: 12/12/19  3:25 PM  Result Value Ref Range   WBC 6.7 4.0 - 10.5 K/uL   RBC 5.02 3.87 - 5.11 MIL/uL   Hemoglobin 14.9 12.0 - 15.0 g/dL   HCT 45.6 36.0 - 46.0 %   MCV 90.8 80.0 - 100.0 fL   MCH 29.7 26.0 - 34.0 pg   MCHC 32.7 30.0 - 36.0 g/dL   RDW 13.8 11.5 - 15.5 %   Platelets 214 150 - 400 K/uL   nRBC 0.0 0.0 - 0.2 %   Neutrophils Relative % 54 %   Neutro Abs 3.6 1.7 - 7.7 K/uL   Lymphocytes Relative 39 %   Lymphs Abs 2.6 0.7 - 4.0 K/uL   Monocytes Relative 5 %   Monocytes  Absolute 0.3 0.1 - 1.0 K/uL   Eosinophils Relative 1 %   Eosinophils Absolute 0.0 0.0 - 0.5 K/uL   Basophils Relative 1 %   Basophils Absolute 0.1 0.0 - 0.1 K/uL   Immature Granulocytes 0 %   Abs Immature Granulocytes 0.01 0.00 - 0.07 K/uL    Comment: Performed at [facility], [address], [place], [place] [address]    RADIOGRAPHIC STUDIES: I have personally reviewed the radiological images as listed and agreed with the findings in the report.  ASSESSMENT & PLAN:  POLYCYTHEMIA 1.  Erythrocytosis: -CBC on 10/17/2019 shows hemoglobin of 16.3 and hematocrit 48.3.  Hematocrit on 12/06/2019 shows 45.2. -She is a current active smoker, smokes 1 pack/day for the past 40 years. -Denies any signs or symptoms of sleep apnea. -Denies any environmental exposure to carbon monoxide.  She is not on any diuretics. A CT abdomen and pelvis from 08/21/2015 showed staghorn calculi in the right kidney. -Physical examination did not reveal any palpable splenomegaly or adenopathy. -We have talked to her in detail about the differential diagnosis of elevated hemoglobin and hematocrit. -We will repeat her CBC today.  We will check JAK2 V617F mutation for ruling out primary polycythemia.  We will also check serum erythropoietin level. -If the above tests are nondiagnostic, will consider abdominal imaging for tumors/cysts in her kidneys or liver.     All questions were answered. The patient knows to call  the clinic with any problems, questions or concerns.      Derek Jack, MD 12/12/19 6:17 PM

## 2019-12-12 NOTE — Assessment & Plan Note (Addendum)
1.  Erythrocytosis: -CBC on 10/17/2019 shows hemoglobin of 16.3 and hematocrit 48.3.  Hematocrit on 12/06/2019 shows 45.2. -She is a current active smoker, smokes 1 pack/day for the past 40 years. -Denies any signs or symptoms of sleep apnea. -Denies any environmental exposure to carbon monoxide.  She is not on any diuretics. A CT abdomen and pelvis from 08/21/2015 showed staghorn calculi in the right kidney. -Physical examination did not reveal any palpable splenomegaly or adenopathy. -We have talked to her in detail about the differential diagnosis of elevated hemoglobin and hematocrit. -We will repeat her CBC today.  We will check JAK2 V617F mutation for ruling out primary polycythemia.  We will also check serum erythropoietin level. -If the above tests are nondiagnostic, will consider abdominal imaging for tumors/cysts in her kidneys or liver.

## 2019-12-12 NOTE — Telephone Encounter (Signed)
Pt has appt tomorrow; Rad Dept @ [facility] needs new order for CT low dose for tommorw. The old odrer Judson Roch enter has to be d/c.

## 2019-12-13 ENCOUNTER — Ambulatory Visit (HOSPITAL_COMMUNITY)
Admission: RE | Admit: 2019-12-13 | Discharge: 2019-12-13 | Disposition: A | Discharge status: Home / Self Care | Payer: PPO | Source: Ambulatory Visit | Attending: Internal Medicine | Admitting: Internal Medicine

## 2019-12-13 DIAGNOSIS — F17210 Nicotine dependence, cigarettes, uncomplicated: Secondary | ICD-10-CM | POA: Diagnosis not present

## 2019-12-13 DIAGNOSIS — Z122 Encounter for screening for malignant neoplasm of respiratory organs: Secondary | ICD-10-CM | POA: Insufficient documentation

## 2019-12-14 LAB — ERYTHROPOIETIN: Erythropoietin: 11.9 m[IU]/mL (ref 2.6–18.5)

## 2019-12-20 NOTE — Telephone Encounter (Signed)
Done

## 2019-12-21 LAB — CALR + JAK2 E12-15 + MPL (REFLEXED)

## 2019-12-21 LAB — JAK2 V617F, W REFLEX TO CALR/E12/MPL

## 2019-12-28 ENCOUNTER — Ambulatory Visit (INDEPENDENT_AMBULATORY_CARE_PROVIDER_SITE_OTHER): Payer: PPO | Admitting: Internal Medicine

## 2019-12-28 ENCOUNTER — Encounter (INDEPENDENT_AMBULATORY_CARE_PROVIDER_SITE_OTHER): Payer: Self-pay | Admitting: Internal Medicine

## 2019-12-28 ENCOUNTER — Other Ambulatory Visit: Payer: Self-pay

## 2019-12-28 VITALS — BP 130/80 | HR 89 | Temp 97.4°F | Resp 18 | Ht 63.0 in | Wt 163.0 lb

## 2019-12-28 DIAGNOSIS — E559 Vitamin D deficiency, unspecified: Secondary | ICD-10-CM | POA: Diagnosis not present

## 2019-12-28 DIAGNOSIS — I10 Essential (primary) hypertension: Secondary | ICD-10-CM | POA: Diagnosis not present

## 2019-12-28 DIAGNOSIS — E785 Hyperlipidemia, unspecified: Secondary | ICD-10-CM | POA: Diagnosis not present

## 2019-12-28 NOTE — Progress Notes (Signed)
Metrics: Intervention Frequency ACO  Documented Smoking Status Yearly  Screened one or more times in 24 months  Cessation Counseling or  Active cessation medication Past 24 months  Past 24 months   Guideline developer: UpToDate (See UpToDate for funding source) Date Released: 2014       Wellness Office Visit  Subjective:  Patient ID: Leah [name], female    DOB: August 19, 1956  Age: 64 y.o. MRN: [id number]  CC: This lady comes in for follow-up of hypertension, hyperlipidemia, coronary artery disease, vitamin D deficiency HPI  She is doing reasonably well.  She is being investigated for erythrocytosis, which I suspect is likely due to her smoking history but she has a follow-up coming up with the hematologist/oncologist in 4 days time. Otherwise she denies any chest pain, dyspnea, palpitations or limb weakness. Unfortunately, she continues smoking cigarettes and I am not sure she will ever quit. Past Medical History:  Diagnosis Date  . Arthritis   . Borderline diabetes   . Breast lump on right side at 3 o'clock position    Benign, fatty mass  . History of kidney stones   . History of shingles   . Hyperlipidemia   . Hypertension   . Myocardial infarction ([place]) 1996    HAD ANGIOPLASTY WITH 2 STENTS  . Tinnitus    right ear   . Vitamin D deficiency 10/17/2019  . Wears glasses       Family History  Problem Relation Age of Onset  . Heart disease Mother   . Diabetes Mother   . Cancer Father        bone  . Cirrhosis Father   . Stroke Maternal Grandmother   . Stroke Maternal Grandfather   . Heart disease Sister   . Obesity Brother   . Healthy Daughter     Social History   Social History Narrative  . Not on file   Social History   Tobacco Use  . Smoking status: Current Every Day Smoker    Packs/day: 1.00    Years: 40.00    Pack years: 40.00    Types: Cigarettes  . Smokeless tobacco: Never Used  Substance Use Topics  . Alcohol use: No    Alcohol/week: 0.0  standard drinks    Current Meds  Medication Sig  . amLODipine (NORVASC) 5 MG tablet Take 1 tablet (5 mg total) by mouth daily. (Patient taking differently: Take 10 mg by mouth daily. )  . aspirin EC 81 MG tablet Take 81 mg by mouth every morning.   Marland Kitchen atorvastatin (LIPITOR) 80 MG tablet Take 1 tablet (80 mg total) by mouth every morning.  . Blood Pressure Monitoring (BLOOD PRESSURE MONITOR AUTOMAT) DEVI 1 Device by Does not apply route daily. Check blood pressure daily  . Cholecalciferol (VITAMIN D-3) 1000 UNITS CAPS Take 1,000 Units by mouth 2 (two) times daily.   Marland Kitchen lisinopril (ZESTRIL) 10 MG tablet Take 1 tablet (10 mg total) by mouth every morning.  . metoprolol succinate (TOPROL-XL) 50 MG 24 hr tablet Take 1 tablet (50 mg total) by mouth daily. Take with or immediately following a meal.  . Multiple Vitamin (MULTIVITAMIN WITH MINERALS) TABS tablet Take 1 tablet by mouth every morning.  . Omega-3 Fatty Acids (OMEGA 3 500 PO) Take 500 mg by mouth every morning.  . vitamin B-12 (CYANOCOBALAMIN) 1000 MCG tablet Take 5,000 mcg by mouth daily.  . vitamin E 400 UNIT capsule Take 400 Units by mouth daily.  Objective:   Today's Vitals: BP 130/80 (BP Location: Right Arm, Patient Position: Sitting, Cuff Size: Normal)   Pulse 89   Temp (!) 97.4 F (36.3 C) (Temporal)   Resp 18   Wt 163 lb (73.9 kg)   SpO2 96% Comment: wearing a mask.  BMI 28.87 kg/m  Vitals with BMI 12/28/2019 12/12/2019 12/06/2019  Height - 5\' 3"  5\' 3"   Weight 163 lbs 160 lbs 14 oz 163 lbs 3 oz  BMI 28.88 [id number]  Systolic [id number]  Diastolic 80 73 82  Pulse 89 98 74     Physical Exam  She looks systemically well.  Blood pressure is better controlled today after increase of medications.  Alert and orientated without any focal neurological signs.     Assessment   1. Essential hypertension   2. Hyperlipidemia, unspecified hyperlipidemia type   3. Vitamin D deficiency       Tests ordered No  orders of the defined types were placed in this encounter.    Plan: 1.  She will continue with current antihypertensive medications which seems to be controlling her blood pressure. 2.She will continue with statin therapy for hyperlipidemia in the face of coronary artery disease.  Her blood levels were reasonable last time checked. 3.  She will follow-up with Judson Roch in about 3 months time.   No orders of the defined types were placed in this encounter.   Doree Albee, MD

## 2020-01-01 ENCOUNTER — Ambulatory Visit (HOSPITAL_COMMUNITY): Payer: PPO | Admitting: Hematology

## 2020-01-10 ENCOUNTER — Ambulatory Visit (HOSPITAL_COMMUNITY): Payer: PPO | Admitting: Hematology

## 2020-01-24 ENCOUNTER — Inpatient Hospital Stay (HOSPITAL_COMMUNITY): Payer: PPO | Attending: Hematology | Admitting: Hematology

## 2020-01-24 ENCOUNTER — Other Ambulatory Visit: Payer: Self-pay

## 2020-01-24 DIAGNOSIS — F17210 Nicotine dependence, cigarettes, uncomplicated: Secondary | ICD-10-CM | POA: Diagnosis not present

## 2020-01-24 DIAGNOSIS — D751 Secondary polycythemia: Secondary | ICD-10-CM | POA: Diagnosis not present

## 2020-01-24 NOTE — Progress Notes (Signed)
Virtual Visit via Telephone Note  I connected with Leah [name] on 01/24/20 at  3:05 PM EST by telephone and verified that I am speaking with the correct person using two identifiers.   I discussed the limitations, risks, security and privacy concerns of performing an evaluation and management service by telephone and the availability of in person appointments. I also discussed with the patient that there may be a patient responsible charge related to this service. The patient expressed understanding and agreed to proceed.   History of Present Illness: She was evaluated in our clinic for erythrocytosis with CBC on 10/17/2019 showing hemoglobin of 16.3 and hematocrit of 48.3.  Platelet count was normal.  She is a current active smoker, 1 pack/day for 40 years.  Denies any symptoms of sleep apnea.   Observations/Objective: She denies any fevers, night sweats or weight loss.  Denies any prior history of malignancy.  No hematuria reported.  Appetite and energy levels are 75%.  No clinical signs or symptoms of sleep apnea.  Assessment and Plan:  1.  Erythrocytosis: -Repeat labs on 12/12/2019 showed hemoglobin 14.9 and hematocrit 45.6 which are within normal limits.  Platelet count is 214. -LDH was normal.  Serum erythropoietin was 11.9.  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 -This is commonly associated with essential thrombocythemia.  However patient's platelet count is normal.  The other common association is with progressive myelofibrosis.  Patient does not have any cytopenias at this time.  This can also be associated with MDS, AML, CML and rarely solid tumors.  She does not have any clinical signs or symptoms of active malignancy at this time. -We will follow her in 4 months with repeat labs.  3.  Tobacco abuse: -She smokes 1 pack/day for 40 years. -CT chest lung cancer screening low-dose shows lung RADS 1, negative.  Low-dose chest CT  recommended in 12 months.   Follow Up Instructions: RTC 4 months with labs.   I discussed the assessment and treatment plan with the patient. The patient was provided an opportunity to ask questions and all were answered. The patient agreed with the plan and demonstrated an understanding of the instructions.   The patient was advised to call back or seek an in-person evaluation if the symptoms worsen or if the condition fails to improve as anticipated.  I provided 12 minutes of non-face-to-face time during this encounter.   Derek Jack, MD

## 2020-02-15 ENCOUNTER — Other Ambulatory Visit (INDEPENDENT_AMBULATORY_CARE_PROVIDER_SITE_OTHER): Payer: Self-pay | Admitting: Nurse Practitioner

## 2020-02-15 DIAGNOSIS — R5383 Other fatigue: Secondary | ICD-10-CM

## 2020-02-15 DIAGNOSIS — I2510 Atherosclerotic heart disease of native coronary artery without angina pectoris: Secondary | ICD-10-CM

## 2020-02-15 DIAGNOSIS — Z122 Encounter for screening for malignant neoplasm of respiratory organs: Secondary | ICD-10-CM

## 2020-02-15 DIAGNOSIS — L853 Xerosis cutis: Secondary | ICD-10-CM

## 2020-02-15 DIAGNOSIS — E559 Vitamin D deficiency, unspecified: Secondary | ICD-10-CM

## 2020-02-15 DIAGNOSIS — Z130 Encounter for screening for diseases of the blood and blood-forming organs and certain disorders involving the immune mechanism: Secondary | ICD-10-CM

## 2020-02-15 DIAGNOSIS — Z13228 Encounter for screening for other metabolic disorders: Secondary | ICD-10-CM

## 2020-02-15 DIAGNOSIS — Z1211 Encounter for screening for malignant neoplasm of colon: Secondary | ICD-10-CM

## 2020-02-15 DIAGNOSIS — Z13220 Encounter for screening for lipoid disorders: Secondary | ICD-10-CM

## 2020-02-15 DIAGNOSIS — I10 Essential (primary) hypertension: Secondary | ICD-10-CM

## 2020-02-15 DIAGNOSIS — Z23 Encounter for immunization: Secondary | ICD-10-CM

## 2020-02-15 DIAGNOSIS — Z0001 Encounter for general adult medical examination with abnormal findings: Secondary | ICD-10-CM

## 2020-02-15 DIAGNOSIS — Z1231 Encounter for screening mammogram for malignant neoplasm of breast: Secondary | ICD-10-CM

## 2020-02-16 ENCOUNTER — Telehealth: Payer: Self-pay

## 2020-02-16 NOTE — Telephone Encounter (Signed)
Pt was taking 10 mg instead of 5 mg as directed of amlodipine

## 2020-02-16 NOTE — Telephone Encounter (Signed)
Pt has questions about her medication   Please call [phone]   Thanks renee

## 2020-02-19 ENCOUNTER — Other Ambulatory Visit (INDEPENDENT_AMBULATORY_CARE_PROVIDER_SITE_OTHER): Payer: Self-pay | Admitting: Nurse Practitioner

## 2020-02-19 DIAGNOSIS — Z1211 Encounter for screening for malignant neoplasm of colon: Secondary | ICD-10-CM

## 2020-02-19 DIAGNOSIS — Z0001 Encounter for general adult medical examination with abnormal findings: Secondary | ICD-10-CM

## 2020-02-19 DIAGNOSIS — Z122 Encounter for screening for malignant neoplasm of respiratory organs: Secondary | ICD-10-CM

## 2020-02-19 DIAGNOSIS — Z130 Encounter for screening for diseases of the blood and blood-forming organs and certain disorders involving the immune mechanism: Secondary | ICD-10-CM

## 2020-02-19 DIAGNOSIS — L853 Xerosis cutis: Secondary | ICD-10-CM

## 2020-02-19 DIAGNOSIS — R5383 Other fatigue: Secondary | ICD-10-CM

## 2020-02-19 DIAGNOSIS — Z13228 Encounter for screening for other metabolic disorders: Secondary | ICD-10-CM

## 2020-02-19 DIAGNOSIS — E559 Vitamin D deficiency, unspecified: Secondary | ICD-10-CM

## 2020-02-19 DIAGNOSIS — Z13220 Encounter for screening for lipoid disorders: Secondary | ICD-10-CM

## 2020-02-19 DIAGNOSIS — Z1231 Encounter for screening mammogram for malignant neoplasm of breast: Secondary | ICD-10-CM

## 2020-02-19 DIAGNOSIS — I10 Essential (primary) hypertension: Secondary | ICD-10-CM

## 2020-02-19 DIAGNOSIS — Z23 Encounter for immunization: Secondary | ICD-10-CM

## 2020-02-19 DIAGNOSIS — I2510 Atherosclerotic heart disease of native coronary artery without angina pectoris: Secondary | ICD-10-CM

## 2020-03-13 ENCOUNTER — Other Ambulatory Visit (INDEPENDENT_AMBULATORY_CARE_PROVIDER_SITE_OTHER): Payer: Self-pay | Admitting: Internal Medicine

## 2020-03-13 ENCOUNTER — Telehealth (INDEPENDENT_AMBULATORY_CARE_PROVIDER_SITE_OTHER): Payer: Self-pay | Admitting: Internal Medicine

## 2020-03-13 DIAGNOSIS — I2510 Atherosclerotic heart disease of native coronary artery without angina pectoris: Secondary | ICD-10-CM

## 2020-03-13 DIAGNOSIS — Z122 Encounter for screening for malignant neoplasm of respiratory organs: Secondary | ICD-10-CM

## 2020-03-13 DIAGNOSIS — Z13228 Encounter for screening for other metabolic disorders: Secondary | ICD-10-CM

## 2020-03-13 DIAGNOSIS — I10 Essential (primary) hypertension: Secondary | ICD-10-CM

## 2020-03-13 DIAGNOSIS — Z130 Encounter for screening for diseases of the blood and blood-forming organs and certain disorders involving the immune mechanism: Secondary | ICD-10-CM

## 2020-03-13 DIAGNOSIS — Z1211 Encounter for screening for malignant neoplasm of colon: Secondary | ICD-10-CM

## 2020-03-13 DIAGNOSIS — Z13220 Encounter for screening for lipoid disorders: Secondary | ICD-10-CM

## 2020-03-13 DIAGNOSIS — Z1231 Encounter for screening mammogram for malignant neoplasm of breast: Secondary | ICD-10-CM

## 2020-03-13 DIAGNOSIS — R5383 Other fatigue: Secondary | ICD-10-CM

## 2020-03-13 DIAGNOSIS — L853 Xerosis cutis: Secondary | ICD-10-CM

## 2020-03-13 DIAGNOSIS — Z23 Encounter for immunization: Secondary | ICD-10-CM

## 2020-03-13 DIAGNOSIS — Z0001 Encounter for general adult medical examination with abnormal findings: Secondary | ICD-10-CM

## 2020-03-13 DIAGNOSIS — E559 Vitamin D deficiency, unspecified: Secondary | ICD-10-CM

## 2020-03-13 MED ORDER — ATORVASTATIN CALCIUM 80 MG PO TABS: ORAL_TABLET | ORAL | 1 refills | Status: DC

## 2020-03-13 MED ORDER — LISINOPRIL 10 MG PO TABS: ORAL_TABLET | ORAL | 1 refills | Status: DC

## 2020-03-13 MED ORDER — METOPROLOL SUCCINATE ER 50 MG PO TB24: ORAL_TABLET | ORAL | 1 refills | Status: DC

## 2020-03-13 MED ORDER — AMLODIPINE BESYLATE 10 MG PO TABS: 10.0000 mg | ORAL_TABLET | Freq: Every day | ORAL | 1 refills | Status: DC

## 2020-03-14 NOTE — Telephone Encounter (Signed)
Done

## 2020-03-28 ENCOUNTER — Other Ambulatory Visit: Payer: Self-pay

## 2020-03-28 ENCOUNTER — Ambulatory Visit (INDEPENDENT_AMBULATORY_CARE_PROVIDER_SITE_OTHER): Payer: PPO | Admitting: Nurse Practitioner

## 2020-03-28 ENCOUNTER — Encounter (INDEPENDENT_AMBULATORY_CARE_PROVIDER_SITE_OTHER): Payer: Self-pay | Admitting: Nurse Practitioner

## 2020-03-28 VITALS — BP 184/84 | HR 72 | Temp 97.3°F | Resp 16 | Ht 63.0 in | Wt 165.0 lb

## 2020-03-28 DIAGNOSIS — E559 Vitamin D deficiency, unspecified: Secondary | ICD-10-CM | POA: Diagnosis not present

## 2020-03-28 DIAGNOSIS — I10 Essential (primary) hypertension: Secondary | ICD-10-CM

## 2020-03-28 DIAGNOSIS — I2510 Atherosclerotic heart disease of native coronary artery without angina pectoris: Secondary | ICD-10-CM

## 2020-03-28 DIAGNOSIS — R718 Other abnormality of red blood cells: Secondary | ICD-10-CM

## 2020-03-28 DIAGNOSIS — E785 Hyperlipidemia, unspecified: Secondary | ICD-10-CM | POA: Diagnosis not present

## 2020-03-28 MED ORDER — LISINOPRIL 10 MG PO TABS: 20.0000 mg | ORAL_TABLET | Freq: Every day | ORAL | 0 refills | Status: DC

## 2020-03-28 NOTE — Progress Notes (Signed)
Subjective:  Patient ID: Leah [name], female    DOB: 03/20/1956  Age: 64 y.o. MRN: [id number]  CC:  Chief Complaint  Patient presents with  . Hypertension    follow up visit   . Hyperlipidemia  . Other    Vitamin D deficiency, elevated blood counts, coronary artery disease      HPI  This patient arrives today for follow-up for the above.  Hypertension: She is a history of hypertension continues on amlodipine 10 mg daily, lisinopril 10 mg daily, and metoprolol 50 mg daily.  She tells me at home she is been checking her blood pressure and it has been running elevated.  She tells me has been running between [id number] systolically and in the Q000111Q diastolically.  Hyperlipidemia: She also has a history of hyperlipidemia.  She continues on baby aspirin, atorvastatin 80 mg daily, and omega-3 supplement daily.  Last cholesterol panel was collected in January 2021 and showed: Lipid Panel         Component                Value               Date/Time                 CHOL                     146                 12/06/2019 1102           TRIG                     76                  12/06/2019 1102           HDL                      47 (L)              12/06/2019 1102           CHOLHDL                  3.1                 12/06/2019 1102           VLDL                     21                  10/10/2008 2241           LDLCALC                  83                  12/06/2019 1102         Vitamin D deficiency: She also continues taking vitamin D3 supplement to treat her vitamin D deficiency.  She tells me she takes 2000 IUs of vitamin D3 daily.  Last serum level showed that her serum vitamin D was 42 back in December 2020.  Elevated blood count: She was noted to have elevated red blood cells, hemoglobin, and hematocrit back in December 2020.  She was referred to oncology/hematology at that time.  They are currently working with her to investigate  this further.  She is scheduled to see them  again in July 2021.  Coronary artery disease: She has a history of coronary artery disease and does follow with cardiology.  She denies any recent chest pain.  She tells me she is scheduled to see them again in July 2021.   Past Medical History:  Diagnosis Date  . Arthritis   . Borderline diabetes   . Breast lump on right side at 3 o'clock position    Benign, fatty mass  . History of kidney stones   . History of shingles   . Hyperlipidemia   . Hypertension   . Myocardial infarction ([place]) 1996    HAD ANGIOPLASTY WITH 2 STENTS  . Tinnitus    right ear   . Vitamin D deficiency 10/17/2019  . Wears glasses       Family History  Problem Relation Age of Onset  . Heart disease Mother   . Diabetes Mother   . Cancer Father        bone  . Cirrhosis Father   . Stroke Maternal Grandmother   . Stroke Maternal Grandfather   . Heart disease Sister   . Obesity Brother   . Healthy Daughter     Social History   Social History Narrative  . Not on file   Social History   Tobacco Use  . Smoking status: Current Every Day Smoker    Packs/day: 1.00    Years: 40.00    Pack years: 40.00    Types: Cigarettes  . Smokeless tobacco: Never Used  Substance Use Topics  . Alcohol use: No    Alcohol/week: 0.0 standard drinks     Current Meds  Medication Sig  . amLODipine (NORVASC) 10 MG tablet Take 1 tablet (10 mg total) by mouth daily.  Marland Kitchen aspirin EC 81 MG tablet Take 81 mg by mouth every morning.   Marland Kitchen atorvastatin (LIPITOR) 80 MG tablet TAKE (1) TABLET BY MOUTH EACH MORNING.  . Blood Pressure Monitoring (BLOOD PRESSURE MONITOR AUTOMAT) DEVI 1 Device by Does not apply route daily. Check blood pressure daily  . Cholecalciferol (VITAMIN D-3) 1000 UNITS CAPS Take 1,000 Units by mouth 2 (two) times daily.   Marland Kitchen lisinopril (ZESTRIL) 10 MG tablet Take 2 tablets (20 mg total) by mouth daily.  . metoprolol succinate (TOPROL-XL) 50 MG 24 hr tablet TAKE ONE TABLET BY MOUTH ONCE DAILY WITH OR  IMMEDIATELY FOLLOWING A MEAL.  . Multiple Vitamin (MULTIVITAMIN WITH MINERALS) TABS tablet Take 1 tablet by mouth every morning.  . Omega-3 Fatty Acids (OMEGA 3 500 PO) Take 500 mg by mouth every morning.  . vitamin B-12 (CYANOCOBALAMIN) 1000 MCG tablet Take 5,000 mcg by mouth daily.  . vitamin E 400 UNIT capsule Take 400 Units by mouth daily.  . [DISCONTINUED] lisinopril (ZESTRIL) 10 MG tablet TAKE (1) TABLET BY MOUTH EACH MORNING.    ROS:  Review of Systems  Constitutional: Negative for fever, malaise/fatigue and weight loss.  Eyes: Negative for blurred vision and double vision.  Respiratory: Negative for cough, shortness of breath and wheezing.   Cardiovascular: Negative for chest pain and palpitations.  Neurological: Negative for dizziness and headaches.     Objective:   Today's Vitals: BP (!) 184/84   Pulse 72   Temp (!) 97.3 F (36.3 C) (Temporal)   Resp 16   Ht 5\' 3"  (1.6 m)   Wt 165 lb (74.8 kg)   SpO2 98%   BMI 29.23 kg/m  Vitals with BMI  03/28/2020 12/28/2019 12/12/2019  Height 5\' 3"  5\' 3"  5\' 3"   Weight 165 lbs 163 lbs 160 lbs 14 oz  BMI 29.24 123456 [id number]  Systolic Q000111Q [id number]  Diastolic 84 80 73  Pulse 72 89 98     Physical Exam Vitals reviewed.  Constitutional:      General: She is not in acute distress.    Appearance: Normal appearance.  HENT:     Head: Normocephalic and atraumatic.  Neck:     Vascular: No carotid bruit.  Cardiovascular:     Rate and Rhythm: Normal rate and regular rhythm.     Pulses: Normal pulses.     Heart sounds: Normal heart sounds.  Pulmonary:     Effort: Pulmonary effort is normal.     Breath sounds: Normal breath sounds.  Skin:    General: Skin is warm and dry.  Neurological:     General: No focal deficit present.     Mental Status: She is alert and oriented to person, place, and time.  Psychiatric:        Mood and Affect: Mood normal.        Behavior: Behavior normal.        Judgment: Judgment normal.           Assessment and Plan   1. Essential hypertension   2. Vitamin D deficiency   3. Coronary artery disease involving native heart without angina pectoris, unspecified vessel or lesion type   4. Hyperlipidemia, unspecified hyperlipidemia type   5. Elevated red blood cell count      Plan: 1.  Blood pressure is much higher than current goal.  We discussed options regarding medication changes aimed at trying to improve blood pressure control.  We will start by increasing her lisinopril to 20 mg daily.  She tells me she just filled her bottle of 10 mg tablets, so for now she will take 2 tablets by mouth a day for total of 20 mg daily.  She will follow-up in 2 weeks at which point we will recheck metabolic panel and blood pressure.  May need to send new prescription with current medication strength at that time.  2.  She will continue on her vitamin D3 supplement dose.  We will check serum level at next office visit.  3.  She continues to be without chest pain.  She will follow-up with cardiology as scheduled.  4.  She will continue on her current medication regimen at this time.  5.  She will follow up with oncology hematology as scheduled.   Tests ordered No orders of the defined types were placed in this encounter.     Meds ordered this encounter  Medications  . lisinopril (ZESTRIL) 10 MG tablet    Sig: Take 2 tablets (20 mg total) by mouth daily.    Dispense:  90 tablet    Refill:  0    Order Specific Question:   Supervising Provider    Answer:   Doree Albee [id number]    Patient to follow-up in 2 weeks  Ailene Ards, NP

## 2020-04-11 ENCOUNTER — Encounter (INDEPENDENT_AMBULATORY_CARE_PROVIDER_SITE_OTHER): Payer: Self-pay | Admitting: Nurse Practitioner

## 2020-04-11 ENCOUNTER — Ambulatory Visit (INDEPENDENT_AMBULATORY_CARE_PROVIDER_SITE_OTHER): Payer: PPO | Admitting: Nurse Practitioner

## 2020-04-11 ENCOUNTER — Other Ambulatory Visit: Payer: Self-pay

## 2020-04-11 VITALS — BP 162/68 | HR 64 | Temp 97.4°F | Resp 15 | Ht 63.0 in | Wt 164.6 lb

## 2020-04-11 DIAGNOSIS — I10 Essential (primary) hypertension: Secondary | ICD-10-CM | POA: Diagnosis not present

## 2020-04-11 DIAGNOSIS — E559 Vitamin D deficiency, unspecified: Secondary | ICD-10-CM

## 2020-04-11 MED ORDER — LISINOPRIL 40 MG PO TABS: 40.0000 mg | ORAL_TABLET | Freq: Every day | ORAL | Status: DC

## 2020-04-11 MED ORDER — HYDROCHLOROTHIAZIDE 12.5 MG PO TABS: 12.5000 mg | ORAL_TABLET | Freq: Every day | ORAL | 1 refills | Status: DC

## 2020-04-11 NOTE — Progress Notes (Signed)
Subjective:  Patient ID: Leah [name], female    DOB: 01-09-1956  Age: 64 y.o. MRN: [id number]  CC:  Chief Complaint  Patient presents with  . Hypertension    bp check, blood work  . Other    Vitamin D deficiency      HPI  This patient comes in today for the above.  Hypertension: At her last office visit we increased her lisinopril to 40 mg daily.  She also continues on her amlodipine and metoprolol.  She tells me she is tolerating these medications well and has not noticed any negative side effects since increasing her lisinopril.  Vitamin D deficiency: She continues on 2000 IUs of vitamin D3 daily.  Last serum check was collected in December 2020 and it was 42.   Past Medical History:  Diagnosis Date  . Arthritis   . Borderline diabetes   . Breast lump on right side at 3 o'clock position    Benign, fatty mass  . History of kidney stones   . History of shingles   . Hyperlipidemia   . Hypertension   . Myocardial infarction ([place]) 1996    HAD ANGIOPLASTY WITH 2 STENTS  . Tinnitus    right ear   . Vitamin D deficiency 10/17/2019  . Wears glasses       Family History  Problem Relation Age of Onset  . Heart disease Mother   . Diabetes Mother   . Cancer Father        bone  . Cirrhosis Father   . Stroke Maternal Grandmother   . Stroke Maternal Grandfather   . Heart disease Sister   . Obesity Brother   . Healthy Daughter     Social History   Social History Narrative  . Not on file   Social History   Tobacco Use  . Smoking status: Current Every Day Smoker    Packs/day: 1.00    Years: 40.00    Pack years: 40.00    Types: Cigarettes  . Smokeless tobacco: Never Used  Substance Use Topics  . Alcohol use: No    Alcohol/week: 0.0 standard drinks     Current Meds  Medication Sig  . amLODipine (NORVASC) 10 MG tablet Take 1 tablet (10 mg total) by mouth daily.  Marland Kitchen aspirin EC 81 MG tablet Take 81 mg by mouth every morning.   Marland Kitchen atorvastatin (LIPITOR)  80 MG tablet TAKE (1) TABLET BY MOUTH EACH MORNING.  . Blood Pressure Monitoring (BLOOD PRESSURE MONITOR AUTOMAT) DEVI 1 Device by Does not apply route daily. Check blood pressure daily  . Cholecalciferol (VITAMIN D-3) 1000 UNITS CAPS Take 1,000 Units by mouth 2 (two) times daily.   Marland Kitchen lisinopril (ZESTRIL) 40 MG tablet Take 1 tablet (40 mg total) by mouth daily.  . metoprolol succinate (TOPROL-XL) 50 MG 24 hr tablet TAKE ONE TABLET BY MOUTH ONCE DAILY WITH OR IMMEDIATELY FOLLOWING A MEAL.  . metoprolol tartrate (LOPRESSOR) 50 MG tablet Take 50 mg by mouth daily.  . Multiple Vitamin (MULTIVITAMIN WITH MINERALS) TABS tablet Take 1 tablet by mouth every morning.  . Omega-3 Fatty Acids (OMEGA 3 500 PO) Take 500 mg by mouth every morning.  . vitamin B-12 (CYANOCOBALAMIN) 1000 MCG tablet Take 5,000 mcg by mouth daily.  . vitamin E 400 UNIT capsule Take 400 Units by mouth daily.  . [DISCONTINUED] lisinopril (ZESTRIL) 10 MG tablet Take 2 tablets (20 mg total) by mouth daily. (Patient taking differently: Take 40 mg by  mouth daily. )    ROS:  Review of Systems  Constitutional: Negative for malaise/fatigue.  Eyes: Negative for blurred vision.  Respiratory: Negative for shortness of breath.   Cardiovascular: Negative for chest pain.  Neurological: Negative for dizziness.     Objective:   Today's Vitals: BP (!) 162/68   Pulse 64   Temp (!) 97.4 F (36.3 C) (Temporal)   Resp 15   Ht _0  (1.6 m)   Wt 164 lb 9.6 oz (74.7 kg)   SpO2 98%   BMI 29.16 kg/m  Vitals with BMI 04/11/2020 03/28/2020 12/28/2019  Height _1  _2  _3   Weight 164 lbs 10 oz 165 lbs 163 lbs  BMI 29.16 84.13 24.40  Systolic 102 725 366  Diastolic 68 84 80  Pulse 64 72 89     Physical Exam Vitals reviewed.  Constitutional:      General: She is not in acute distress.    Appearance: Normal appearance.  HENT:     Head: Normocephalic and atraumatic.  Neck:     Vascular: No carotid bruit.  Cardiovascular:     Rate  and Rhythm: Normal rate and regular rhythm.     Pulses: Normal pulses.     Heart sounds: Normal heart sounds.  Pulmonary:     Effort: Pulmonary effort is normal.     Breath sounds: Normal breath sounds.  Skin:    General: Skin is warm and dry.  Neurological:     General: No focal deficit present.     Mental Status: She is alert and oriented to person, place, and time.  Psychiatric:        Mood and Affect: Mood normal.        Behavior: Behavior normal.        Judgment: Judgment normal.          Assessment and Plan   1. Essential hypertension   2. Vitamin D deficiency      Plan: 1.  Blood pressure still above goal.  Via shared decision making we will add hydrochlorothiazide 12.5 mg tabs to her current regimen.  She will follow-up in 2 weeks for blood pressure recheck and again to check metabolic panel.  I will send her to have metabolic panel collected in the next couple of days considering we also increased her lisinopril at her last office visit.  2.  I will collect serum vitamin D level for further evaluation.  For now she continue on her current supplement dose.   Tests ordered Orders Placed This Encounter  Procedures  . CMP with eGFR([facility])  . Vitamin D, 25-hydroxy      Meds ordered this encounter  Medications  . lisinopril (ZESTRIL) 40 MG tablet    Sig: Take 1 tablet (40 mg total) by mouth daily.    Order Specific Question:   Supervising Provider    Answer:   Doree Albee [[id number]]  . hydrochlorothiazide (HYDRODIURIL) 12.5 MG tablet    Sig: Take 1 tablet (12.5 mg total) by mouth daily.    Dispense:  30 tablet    Refill:  1    Order Specific Question:   Supervising Provider    Answer:   Doree Albee [[id number]]    Patient to follow-up in 2 weeks  Ailene Ards, NP

## 2020-04-16 ENCOUNTER — Other Ambulatory Visit (INDEPENDENT_AMBULATORY_CARE_PROVIDER_SITE_OTHER): Payer: PPO

## 2020-04-16 ENCOUNTER — Other Ambulatory Visit: Payer: Self-pay

## 2020-04-16 DIAGNOSIS — I10 Essential (primary) hypertension: Secondary | ICD-10-CM | POA: Diagnosis not present

## 2020-04-16 DIAGNOSIS — E559 Vitamin D deficiency, unspecified: Secondary | ICD-10-CM | POA: Diagnosis not present

## 2020-04-16 LAB — COMPLETE METABOLIC PANEL WITH GFR
AG Ratio: 1.4 (calc) (ref 1.0–2.5)
ALT: 16 U/L (ref 6–29)
AST: 21 U/L (ref 10–35)
Albumin: 4 g/dL (ref 3.6–5.1)
Alkaline phosphatase (APISO): 117 U/L (ref 37–153)
BUN/Creatinine Ratio: 8 (calc) (ref 6–22)
BUN: 9 mg/dL (ref 7–25)
CO2: 27 mmol/L (ref 20–32)
Calcium: 9.3 mg/dL (ref 8.6–10.4)
Chloride: 99 mmol/L (ref 98–110)
Creat: 1.12 mg/dL — ABNORMAL HIGH (ref 0.50–0.99)
GFR, Est African American: 60 mL/min/{1.73_m2} (ref >=60)
GFR, Est Non African American: 52 mL/min/{1.73_m2} — ABNORMAL LOW (ref >=60)
Globulin: 2.9 g/dL (calc) (ref 1.9–3.7)
Glucose, Bld: 122 mg/dL — ABNORMAL HIGH (ref 65–99)
Potassium: 3.9 mmol/L (ref 3.5–5.3)
Sodium: 136 mmol/L (ref 135–146)
Total Bilirubin: 0.4 mg/dL (ref 0.2–1.2)
Total Protein: 6.9 g/dL (ref 6.1–8.1)

## 2020-04-16 LAB — VITAMIN D 25 HYDROXY (VIT D DEFICIENCY, FRACTURES): Vit D, 25-Hydroxy: 49 ng/mL (ref 30–100)

## 2020-05-07 ENCOUNTER — Other Ambulatory Visit: Payer: Self-pay

## 2020-05-07 ENCOUNTER — Encounter (INDEPENDENT_AMBULATORY_CARE_PROVIDER_SITE_OTHER): Payer: Self-pay | Admitting: Nurse Practitioner

## 2020-05-07 ENCOUNTER — Ambulatory Visit (INDEPENDENT_AMBULATORY_CARE_PROVIDER_SITE_OTHER): Payer: PPO | Admitting: Nurse Practitioner

## 2020-05-07 VITALS — BP 165/85 | HR 92 | Temp 96.9°F | Ht 63.0 in | Wt 161.6 lb

## 2020-05-07 DIAGNOSIS — I10 Essential (primary) hypertension: Secondary | ICD-10-CM

## 2020-05-07 DIAGNOSIS — E559 Vitamin D deficiency, unspecified: Secondary | ICD-10-CM | POA: Diagnosis not present

## 2020-05-07 MED ORDER — OLMESARTAN MEDOXOMIL 20 MG PO TABS: 20.0000 mg | ORAL_TABLET | Freq: Every day | ORAL | 1 refills | Status: DC

## 2020-05-07 NOTE — Progress Notes (Signed)
Subjective:  Patient ID: Leah [name], female    DOB: [birth year]  Age: 65 y.o. MRN: [id number]  CC:  Chief Complaint  Patient presents with  . Hypertension  . Follow-up  . Other    Vitamin D deficiency      HPI  This patient arrives today for the above.  She has had frequent follow-up address her hypertension and currently her medication regimen includes amlodipine 10 mg daily, metoprolol 50 mg daily, lisinopril 40 mg daily, and at her last office visit approximately 1 month ago we added hydrochlorothiazide 12.5 mg daily.  She tells me she is tolerating these medications well.  Last metabolic panel was collected on 04/16/20 and it did show a mildly elevated creatinine, with normal EGFR and normal electrolytes.  She continues on 2000 IUs of vitamin D3 daily.  Last serum check was collected earlier this month and did show a level of 49.   Past Medical History:  Diagnosis Date  . Arthritis   . Borderline diabetes   . Breast lump on right side at 3 o'clock position    Benign, fatty mass  . History of kidney stones   . History of shingles   . Hyperlipidemia   . Hypertension   . Myocardial infarction ([place]) 1996    HAD ANGIOPLASTY WITH 2 STENTS  . Tinnitus    right ear   . Vitamin D deficiency 10/17/2019  . Wears glasses       Family History  Problem Relation Age of Onset  . Heart disease Mother   . Diabetes Mother   . Cancer Father        bone  . Cirrhosis Father   . Stroke Maternal Grandmother   . Stroke Maternal Grandfather   . Heart disease Sister   . Obesity Brother   . Healthy Daughter     Social History   Social History Narrative  . Not on file   Social History   Tobacco Use  . Smoking status: Current Every Day Smoker    Packs/day: 1.00    Years: 40.00    Pack years: 40.00    Types: Cigarettes  . Smokeless tobacco: Never Used  Substance Use Topics  . Alcohol use: No    Alcohol/week: 0.0 standard drinks     Current Meds  Medication Sig    . amLODipine (NORVASC) 10 MG tablet Take 1 tablet (10 mg total) by mouth daily.  Marland Kitchen aspirin EC 81 MG tablet Take 81 mg by mouth every morning.   Marland Kitchen atorvastatin (LIPITOR) 80 MG tablet TAKE (1) TABLET BY MOUTH EACH MORNING.  . Blood Pressure Monitoring (BLOOD PRESSURE MONITOR AUTOMAT) DEVI 1 Device by Does not apply route daily. Check blood pressure daily  . Cholecalciferol (VITAMIN D-3) 1000 UNITS CAPS Take 1,000 Units by mouth 2 (two) times daily.   . hydrochlorothiazide (HYDRODIURIL) 12.5 MG tablet Take 1 tablet (12.5 mg total) by mouth daily.  . metoprolol succinate (TOPROL-XL) 50 MG 24 hr tablet TAKE ONE TABLET BY MOUTH ONCE DAILY WITH OR IMMEDIATELY FOLLOWING A MEAL.  . metoprolol tartrate (LOPRESSOR) 50 MG tablet Take 50 mg by mouth daily.  . Multiple Vitamin (MULTIVITAMIN WITH MINERALS) TABS tablet Take 1 tablet by mouth every morning.  . Omega-3 Fatty Acids (OMEGA 3 500 PO) Take 500 mg by mouth every morning.  . vitamin B-12 (CYANOCOBALAMIN) 1000 MCG tablet Take 5,000 mcg by mouth daily.  . vitamin E 400 UNIT capsule Take 400 Units by mouth  daily.  . [DISCONTINUED] lisinopril (ZESTRIL) 40 MG tablet Take 1 tablet (40 mg total) by mouth daily.    ROS:  Review of Systems  Constitutional: Negative.   Respiratory: Negative.   Cardiovascular: Negative.   Neurological: Negative.      Objective:   Today's Vitals: BP (!) 165/85 (BP Location: Left Arm, Patient Position: Sitting, Cuff Size: Normal)   Pulse 92   Temp (!) 96.9 F (36.1 C) (Temporal)   Ht '5\' 3"'  (1.6 m)   Wt 161 lb 9.6 oz (73.3 kg)   SpO2 99%   BMI 28.63 kg/m  Vitals with BMI 05/07/2020 04/11/2020 03/28/2020  Height '5\' 3"'  '5\' 3"'  '5\' 3"'   Weight 161 lbs 10 oz 164 lbs 10 oz 165 lbs  BMI 28.63 81.10 31.59  Systolic 458 592 924  Diastolic 85 68 84  Pulse 92 64 72     Physical Exam Vitals reviewed.  Constitutional:      General: She is not in acute distress.    Appearance: Normal appearance.  HENT:     Head:  Normocephalic and atraumatic.  Neck:     Vascular: No carotid bruit.  Cardiovascular:     Rate and Rhythm: Normal rate and regular rhythm.     Pulses: Normal pulses.     Heart sounds: Normal heart sounds.  Pulmonary:     Effort: Pulmonary effort is normal.     Breath sounds: Normal breath sounds.  Skin:    General: Skin is warm and dry.  Neurological:     General: No focal deficit present.     Mental Status: She is alert and oriented to person, place, and time.  Psychiatric:        Mood and Affect: Mood normal.        Behavior: Behavior normal.        Judgment: Judgment normal.          Assessment and Plan   1. Essential hypertension   2. Vitamin D deficiency      Plan: 1.  Blood pressure has improved since May 2021, however is still above goal.  Goal for her is less than 140/90.  We will change her lisinopril to olmesartan, and continue on her other medications for now.  She will follow-up in 1 to 3 weeks for blood pressure check and repeat CMP.  2.  She will continue on her current vitamin D3 supplement.   Tests ordered No orders of the defined types were placed in this encounter.     Meds ordered this encounter  Medications  . olmesartan (BENICAR) 20 MG tablet    Sig: Take 1 tablet (20 mg total) by mouth daily.    Dispense:  30 tablet    Refill:  1    Order Specific Question:   Supervising Provider    Answer:   Doree Albee [[id number]]    Patient to follow-up in 1 to 3 weeks, she was counseled to call us with any questions or concerns and to let us know if she feels unwell with her medication changes.  Ailene Ards, NP

## 2020-05-09 ENCOUNTER — Other Ambulatory Visit (INDEPENDENT_AMBULATORY_CARE_PROVIDER_SITE_OTHER): Payer: Self-pay | Admitting: Nurse Practitioner

## 2020-05-09 DIAGNOSIS — I10 Essential (primary) hypertension: Secondary | ICD-10-CM

## 2020-05-23 ENCOUNTER — Other Ambulatory Visit: Payer: Self-pay

## 2020-05-23 ENCOUNTER — Ambulatory Visit (INDEPENDENT_AMBULATORY_CARE_PROVIDER_SITE_OTHER): Payer: PPO | Admitting: Nurse Practitioner

## 2020-05-23 ENCOUNTER — Encounter (INDEPENDENT_AMBULATORY_CARE_PROVIDER_SITE_OTHER): Payer: Self-pay | Admitting: Nurse Practitioner

## 2020-05-23 VITALS — BP 140/65 | HR 70 | Temp 96.6°F | Ht 63.0 in | Wt 159.6 lb

## 2020-05-23 DIAGNOSIS — I10 Essential (primary) hypertension: Secondary | ICD-10-CM

## 2020-05-23 LAB — COMPLETE METABOLIC PANEL WITH GFR
AG Ratio: 1.3 (calc) (ref 1.0–2.5)
ALT: 12 U/L (ref 6–29)
AST: 16 U/L (ref 10–35)
Albumin: 3.9 g/dL (ref 3.6–5.1)
Alkaline phosphatase (APISO): 111 U/L (ref 37–153)
BUN/Creatinine Ratio: 10 (calc) (ref 6–22)
BUN: 10 mg/dL (ref 7–25)
CO2: 29 mmol/L (ref 20–32)
Calcium: 9.5 mg/dL (ref 8.6–10.4)
Chloride: 102 mmol/L (ref 98–110)
Creat: 1.02 mg/dL — ABNORMAL HIGH (ref 0.50–0.99)
GFR, Est African American: 67 mL/min/{1.73_m2} (ref >=60)
GFR, Est Non African American: 58 mL/min/{1.73_m2} — ABNORMAL LOW (ref >=60)
Globulin: 2.9 g/dL (calc) (ref 1.9–3.7)
Glucose, Bld: 111 mg/dL — ABNORMAL HIGH (ref 65–99)
Potassium: 3.9 mmol/L (ref 3.5–5.3)
Sodium: 138 mmol/L (ref 135–146)
Total Bilirubin: 0.5 mg/dL (ref 0.2–1.2)
Total Protein: 6.8 g/dL (ref 6.1–8.1)

## 2020-05-23 NOTE — Progress Notes (Signed)
Subjective:  Patient ID: Leah [name], female    DOB: 09-08-1956  Age: 64 y.o. MRN: [id number]  CC:  Chief Complaint  Patient presents with  . Hypertension  . Follow-up      HPI  This patient arrives today for follow-up of her hypertension.  At her last office visit we stopped lisinopril and changed to olmesartan. She continues on amlodipine, metoprolol, and hydrochlorothiazide. She tells me she is feeling well and is tolerating these medications well. She is due to have repeat metabolic panel checked today.   Past Medical History:  Diagnosis Date  . Arthritis   . Borderline diabetes   . Breast lump on right side at 3 o'clock position    Benign, fatty mass  . History of kidney stones   . History of shingles   . Hyperlipidemia   . Hypertension   . Myocardial infarction ([place]) 1996    HAD ANGIOPLASTY WITH 2 STENTS  . Tinnitus    right ear   . Vitamin D deficiency 10/17/2019  . Wears glasses       Family History  Problem Relation Age of Onset  . Heart disease Mother   . Diabetes Mother   . Cancer Father        bone  . Cirrhosis Father   . Stroke Maternal Grandmother   . Stroke Maternal Grandfather   . Heart disease Sister   . Obesity Brother   . Healthy Daughter     Social History   Social History Narrative  . Not on file   Social History   Tobacco Use  . Smoking status: Current Every Day Smoker    Packs/day: 1.00    Years: 40.00    Pack years: 40.00    Types: Cigarettes  . Smokeless tobacco: Never Used  Substance Use Topics  . Alcohol use: No    Alcohol/week: 0.0 standard drinks     Current Meds  Medication Sig  . amLODipine (NORVASC) 10 MG tablet Take 1 tablet (10 mg total) by mouth daily.  Marland Kitchen aspirin EC 81 MG tablet Take 81 mg by mouth every morning.   Marland Kitchen atorvastatin (LIPITOR) 80 MG tablet TAKE (1) TABLET BY MOUTH EACH MORNING.  . Blood Pressure Monitoring (BLOOD PRESSURE MONITOR AUTOMAT) DEVI 1 Device by Does not apply route daily.  Check blood pressure daily  . Cholecalciferol (VITAMIN D-3) 1000 UNITS CAPS Take 1,000 Units by mouth 2 (two) times daily.   . hydrochlorothiazide (HYDRODIURIL) 12.5 MG tablet TAKE 1 TABLET BY MOUTH ONCE DAILY.  . metoprolol succinate (TOPROL-XL) 50 MG 24 hr tablet TAKE ONE TABLET BY MOUTH ONCE DAILY WITH OR IMMEDIATELY FOLLOWING A MEAL.  . metoprolol tartrate (LOPRESSOR) 50 MG tablet Take 50 mg by mouth daily.  . Multiple Vitamin (MULTIVITAMIN WITH MINERALS) TABS tablet Take 1 tablet by mouth every morning.  . olmesartan (BENICAR) 20 MG tablet Take 1 tablet (20 mg total) by mouth daily.  . Omega-3 Fatty Acids (OMEGA 3 500 PO) Take 500 mg by mouth every morning.  . vitamin B-12 (CYANOCOBALAMIN) 1000 MCG tablet Take 5,000 mcg by mouth daily.  . vitamin E 400 UNIT capsule Take 400 Units by mouth daily.    ROS:  Review of Systems  Constitutional: Negative for fever and malaise/fatigue.  Eyes: Negative for blurred vision.  Respiratory: Negative for cough and shortness of breath.   Cardiovascular: Negative for chest pain.  Neurological: Negative for dizziness and headaches.     Objective:  Today's Vitals: BP 140/65 (BP Location: Left Arm, Patient Position: Sitting, Cuff Size: Normal)   Pulse 70   Temp (!) 96.6 F (35.9 C) (Temporal)   Ht '5\' 3"'  (1.6 m)   Wt 159 lb 9.6 oz (72.4 kg)   SpO2 99%   BMI 28.27 kg/m  Vitals with BMI 05/23/2020 05/07/2020 04/11/2020  Height '5\' 3"'  '5\' 3"'  '5\' 3"'   Weight 159 lbs 10 oz 161 lbs 10 oz 164 lbs 10 oz  BMI 28.28 03.49 61.16  Systolic 435 391 225  Diastolic 65 85 68  Pulse 70 92 64     Physical Exam Vitals reviewed.  Constitutional:      General: She is not in acute distress.    Appearance: Normal appearance.  HENT:     Head: Normocephalic and atraumatic.  Cardiovascular:     Rate and Rhythm: Normal rate and regular rhythm.     Pulses: Normal pulses.     Heart sounds: Normal heart sounds.  Pulmonary:     Effort: Pulmonary effort is normal.      Breath sounds: Normal breath sounds.  Skin:    General: Skin is warm and dry.  Neurological:     General: No focal deficit present.     Mental Status: She is alert and oriented to person, place, and time.  Psychiatric:        Mood and Affect: Mood normal.        Behavior: Behavior normal.        Judgment: Judgment normal.          Assessment and Plan   1. Essential hypertension      Plan: 1. Blood pressure is much improved on current regimen. Her systolic blood pressure is borderline elevated, however I am going to keep her on her current medication regimen for now. May need to consider titrating up on the olmesartan subsequent visit. Will check metabolic panel today to monitor electrolytes and kidney function.  Tests ordered Orders Placed This Encounter  Procedures  . CMP with eGFR([facility])      No orders of the defined types were placed in this encounter.   Patient to follow-up in 3 months, or sooner as needed.  Ailene Ards, NP

## 2020-06-05 ENCOUNTER — Other Ambulatory Visit: Payer: Self-pay

## 2020-06-05 ENCOUNTER — Inpatient Hospital Stay (HOSPITAL_COMMUNITY): Payer: PPO

## 2020-06-05 ENCOUNTER — Inpatient Hospital Stay (HOSPITAL_COMMUNITY): Payer: PPO | Attending: Hematology | Admitting: Hematology

## 2020-06-05 VITALS — BP 157/78 | HR 76 | Temp 96.8°F | Resp 18 | Wt 161.0 lb

## 2020-06-05 DIAGNOSIS — Z122 Encounter for screening for malignant neoplasm of respiratory organs: Secondary | ICD-10-CM | POA: Diagnosis not present

## 2020-06-05 DIAGNOSIS — I10 Essential (primary) hypertension: Secondary | ICD-10-CM | POA: Diagnosis not present

## 2020-06-05 DIAGNOSIS — I252 Old myocardial infarction: Secondary | ICD-10-CM | POA: Insufficient documentation

## 2020-06-05 DIAGNOSIS — D751 Secondary polycythemia: Secondary | ICD-10-CM

## 2020-06-05 DIAGNOSIS — E785 Hyperlipidemia, unspecified: Secondary | ICD-10-CM | POA: Diagnosis not present

## 2020-06-05 DIAGNOSIS — F17210 Nicotine dependence, cigarettes, uncomplicated: Secondary | ICD-10-CM | POA: Insufficient documentation

## 2020-06-05 DIAGNOSIS — M1990 Unspecified osteoarthritis, unspecified site: Secondary | ICD-10-CM | POA: Diagnosis not present

## 2020-06-05 DIAGNOSIS — Z79899 Other long term (current) drug therapy: Secondary | ICD-10-CM | POA: Diagnosis not present

## 2020-06-05 DIAGNOSIS — Z7982 Long term (current) use of aspirin: Secondary | ICD-10-CM | POA: Diagnosis not present

## 2020-06-05 LAB — CBC WITH DIFFERENTIAL/PLATELET
Abs Immature Granulocytes: 0.01 10*3/uL (ref 0.00–0.07)
Basophils Absolute: 0 10*3/uL (ref 0.0–0.1)
Basophils Relative: 1 %
Eosinophils Absolute: 0.1 10*3/uL (ref 0.0–0.5)
Eosinophils Relative: 2 %
HCT: 42.2 % (ref 36.0–46.0)
Hemoglobin: 13.9 g/dL (ref 12.0–15.0)
Immature Granulocytes: 0 %
Lymphocytes Relative: 45 %
Lymphs Abs: 3 10*3/uL (ref 0.7–4.0)
MCH: 29.1 pg (ref 26.0–34.0)
MCHC: 32.9 g/dL (ref 30.0–36.0)
MCV: 88.3 fL (ref 80.0–100.0)
Monocytes Absolute: 0.4 10*3/uL (ref 0.1–1.0)
Monocytes Relative: 6 %
Neutro Abs: 3.1 10*3/uL (ref 1.7–7.7)
Neutrophils Relative %: 46 %
Platelets: 248 10*3/uL (ref 150–400)
RBC: 4.78 MIL/uL (ref 3.87–5.11)
RDW: 14.9 % (ref 11.5–15.5)
WBC: 6.8 10*3/uL (ref 4.0–10.5)
nRBC: 0 % (ref 0.0–0.2)

## 2020-06-05 LAB — LACTATE DEHYDROGENASE: LDH: 136 U/L (ref 98–192)

## 2020-06-05 NOTE — Patient Instructions (Signed)
[place] at [facility] Discharge Instructions  You were seen today by Dr. Delton Coombes. He went over your recent results. You will be scheduled for a low-dose CT scan of your chest in January 2022. Dr. Delton Coombes will see you back in 6 months for labs and follow up.   Thank you for choosing [place] at [facility] to provide your oncology and hematology care.  To afford each patient quality time with our provider, please arrive at least 15 minutes before your scheduled appointment time.   If you have a lab appointment with the [facility] please come in thru the Main Entrance and check in at the main information desk  You need to re-schedule your appointment should you arrive 10 or more minutes late.  We strive to give you quality time with our providers, and arriving late affects you and other patients whose appointments are after yours.  Also, if you no show three or more times for appointments you may be dismissed from the clinic at the providers discretion.     Again, thank you for choosing [facility].  Our hope is that these requests will decrease the amount of time that you wait before being seen by our physicians.       _____________________________________________________________  Should you have questions after your visit to [facility], please contact our office at [phone] between the hours of 8:00 a.m. and 4:30 p.m.  Voicemails left after 4:00 p.m. will not be returned until the following business day.  For prescription refill requests, have your pharmacy contact our office and allow 72 hours.    Cancer Center Support Programs:   > Cancer Support Group  2nd Tuesday of the month 1pm-2pm, Journey Room

## 2020-06-05 NOTE — Progress Notes (Signed)
[place] Leah [name], [place] [address]   CLINIC:  Medical Oncology/Hematology  PCP:  Leah Ards, NP [place] / [place] [address]  [phone]  REASON FOR VISIT:  Follow-up for erythrocytosis  PRIOR THERAPY: None  CURRENT THERAPY: Observation  INTERVAL HISTORY:  Ms. Leah [name], a 64 y.o. female, returns for routine follow-up for her erythrocytosis. Leah [name] was last contacted via telephone on 01/24/2020.  Today she reports that she is feeling well. She denies any ED visits since the last visit. She denies having any melena, hematochezia or hematuria. She denies itching after a hot shower, changes in skin color of her fingers, headaches or changes in her vision.  She received her second Leah [name] vaccine on 03/22/2020. She continues to smoke 1 PPD, but she is trying to cut back.   REVIEW OF SYSTEMS:  Review of Systems  Constitutional: Positive for appetite change (mildly decreased) and fatigue (moderate).  Eyes: Negative for eye problems.  Gastrointestinal: Negative for blood in stool.  Genitourinary: Negative for hematuria.   Skin: Negative for itching.  Neurological: Negative for headaches.  All other systems reviewed and are negative.   PAST MEDICAL/SURGICAL HISTORY:  Past Medical History:  Diagnosis Date  . Arthritis   . Borderline diabetes   . Breast lump on right side at 3 o'clock position    Benign, fatty mass  . History of kidney stones   . History of shingles   . Hyperlipidemia   . Hypertension   . Myocardial infarction ([place]) 1996    HAD ANGIOPLASTY WITH 2 STENTS  . Tinnitus    right ear   . Vitamin D deficiency 10/17/2019  . Wears glasses    Past Surgical History:  Procedure Laterality Date  . BREAST LUMPECTOMY  >10 YRS AGO   RT BREAST  . CORONARY ANGIOPLASTY  1996   ANGIOPLASTY / 2 STENTS  . CYSTOSCOPY WITH STENT PLACEMENT Right 12/30/2015   Procedure: CYSTOSCOPY;  Surgeon: Leah Gustin, MD;   Location: [facility];  Service: Urology;  Laterality: Right;  . CYSTOSCOPY WITH STENT PLACEMENT Right 01/09/2016   Procedure: CYSTOSCOPY WITH STENT PLACEMENT;  Surgeon: Leah Gustin, MD;  Location: [facility];  Service: Urology;  Laterality: Right;  . HOLMIUM LASER APPLICATION Right 07/14/9370   Procedure: HOLMIUM LASER APPLICATION;  Surgeon: Leah Gustin, MD;  Location: [facility];  Service: Urology;  Laterality: Right;  . LITHOTRIPSY    . NEPHROLITHOTOMY Right 12/30/2015   Procedure: RIght Nephrostomy with access;  Surgeon: Leah Gustin, MD;  Location: [facility];  Service: Urology;  Laterality: Right;  . NEPHROLITHOTOMY Right 01/09/2016   Procedure: RIGHT PERCUTANEOUS NEPHROLITHOTOMY SECOND LOOK , POSSIBLE DILATION OF TRACT;  Surgeon: Leah Gustin, MD;  Location: [facility];  Service: Urology;  Laterality: Right;    SOCIAL HISTORY:  Social History   Socioeconomic History  . Marital status: Widowed    Spouse name: Not on file  . Number of children: 1  . Years of education: Not on file  . Highest education level: Not on file  Occupational History  . Occupation: Retired  Tobacco Use  . Smoking status: Current Every Day Smoker    Packs/day: 1.00    Years: 40.00    Pack years: 40.00    Types: Cigarettes  . Smokeless tobacco: Never Used  Vaping Use  . Vaping Use: Never used  Substance and Sexual Activity  . Alcohol use: No  Alcohol/week: 0.0 standard drinks  . Drug use: No  . Sexual activity: Not Currently  Other Topics Concern  . Not on file  Social History Narrative  . Not on file   Social Determinants of Health   Financial Resource Strain:   . Difficulty of Paying Living Expenses:   Food Insecurity:   . Worried About Charity fundraiser in the Last Year:   . Arboriculturist in the Last Year:   Transportation Needs:   . Film/video editor (Medical):   Marland Kitchen Lack of Transportation (Non-Medical):   Physical Activity:   . Days of Exercise per Week:   . Minutes of  Exercise per Session:   Stress:   . Feeling of Stress :   Social Connections:   . Frequency of Communication with Friends and Family:   . Frequency of Social Gatherings with Friends and Family:   . Attends Religious Services:   . Active Member of Clubs or Organizations:   . Attends Archivist Meetings:   Marland Kitchen Marital Status:   Intimate Partner Violence:   . Fear of Current or Ex-Partner:   . Emotionally Abused:   Marland Kitchen Physically Abused:   . Sexually Abused:     FAMILY HISTORY:  Family History  Problem Relation Age of Onset  . Heart disease Mother   . Diabetes Mother   . Cancer Father        bone  . Cirrhosis Father   . Stroke Maternal Grandmother   . Stroke Maternal Grandfather   . Heart disease Sister   . Obesity Brother   . Healthy Daughter     CURRENT MEDICATIONS:  Current Outpatient Medications  Medication Sig Dispense Refill  . amLODipine (NORVASC) 10 MG tablet Take 1 tablet (10 mg total) by mouth daily. 90 tablet 1  . aspirin EC 81 MG tablet Take 81 mg by mouth every morning.     Marland Kitchen atorvastatin (LIPITOR) 80 MG tablet TAKE (1) TABLET BY MOUTH EACH MORNING. 90 tablet 1  . Blood Pressure Monitoring (BLOOD PRESSURE MONITOR AUTOMAT) DEVI 1 Device by Does not apply route daily. Check blood pressure daily 1 each 0  . Cholecalciferol (VITAMIN D-3) 1000 UNITS CAPS Take 1,000 Units by mouth 2 (two) times daily.     . hydrochlorothiazide (HYDRODIURIL) 12.5 MG tablet TAKE 1 TABLET BY MOUTH ONCE DAILY. 30 tablet 2  . metoprolol succinate (TOPROL-XL) 50 MG 24 hr tablet TAKE ONE TABLET BY MOUTH ONCE DAILY WITH OR IMMEDIATELY FOLLOWING A MEAL. 90 tablet 1  . Multiple Vitamin (MULTIVITAMIN WITH MINERALS) TABS tablet Take 1 tablet by mouth every morning.    . olmesartan (BENICAR) 20 MG tablet Take 1 tablet (20 mg total) by mouth daily. 30 tablet 1  . Omega-3 Fatty Acids (OMEGA 3 500 PO) Take 500 mg by mouth every morning.    . vitamin B-12 (CYANOCOBALAMIN) 1000 MCG tablet Take  5,000 mcg by mouth daily.    . vitamin E 400 UNIT capsule Take 400 Units by mouth daily.     No current facility-administered medications for this visit.    ALLERGIES:  Allergies  Allergen Reactions  . Codeine     REACTION: " Makes me high and blacked out in the 1980s"    PHYSICAL EXAM:  Performance status (ECOG): 0 - Asymptomatic  Vitals:   06/05/20 1446  BP: (!) 157/78  Pulse: 76  Resp: 18  Temp: (!) 96.8 F (36 C)  SpO2: 100%   Wt  Readings from Last 3 Encounters:  06/05/20 161 lb (73 kg)  05/23/20 159 lb 9.6 oz (72.4 kg)  05/07/20 161 lb 9.6 oz (73.3 kg)   Physical Exam Vitals reviewed.  Constitutional:      Appearance: Normal appearance.  Cardiovascular:     Rate and Rhythm: Normal rate and regular rhythm.     Pulses: Normal pulses.     Heart sounds: Normal heart sounds.  Pulmonary:     Effort: Pulmonary effort is normal.     Breath sounds: Normal breath sounds.  Abdominal:     Palpations: Abdomen is soft. There is no hepatomegaly, splenomegaly or mass.     Tenderness: There is no abdominal tenderness.  Lymphadenopathy:     Cervical: No cervical adenopathy.     Upper Body:     Right upper body: No supraclavicular or axillary adenopathy.     Left upper body: No supraclavicular or axillary adenopathy.     Lower Body: No right inguinal adenopathy. No left inguinal adenopathy.  Neurological:     General: No focal deficit present.     Mental Status: She is alert and oriented to person, place, and time.  Psychiatric:        Mood and Affect: Mood normal.        Behavior: Behavior normal.     LABORATORY DATA:  I have reviewed the labs as listed.  CBC Latest Ref Rng & Units 06/05/2020 12/12/2019 12/06/2019  WBC 4.0 - 10.5 K/uL 6.8 6.7 6.1  Hemoglobin 12.0 - 15.0 g/dL 13.9 14.9 15.2  Hematocrit 36 - 46 % 42.2 45.6 45.2(H)  Platelets 150 - 400 K/uL 248 214 197   CMP Latest Ref Rng & Units 05/23/2020 04/16/2020 12/12/2019  Glucose 65 - 99 mg/dL 111(H) 122(H) 110(H)   BUN 7 - 25 mg/dL 10 9 8   Creatinine 0.50 - 0.99 mg/dL 1.02(H) 1.12(H) 0.82  Sodium 135 - 146 mmol/L 138 136 138  Potassium 3.5 - 5.3 mmol/L 3.9 3.9 4.3  Chloride 98 - 110 mmol/L 102 99 103  CO2 20 - 32 mmol/L 29 27 28   Calcium 8.6 - 10.4 mg/dL 9.5 9.3 9.4  Total Protein 6.1 - 8.1 g/dL 6.8 6.9 7.1  Total Bilirubin 0.2 - 1.2 mg/dL 0.5 0.4 0.6  Alkaline Phos 38 - 126 U/L - - 130(H)  AST 10 - 35 U/L 16 21 20   ALT 6 - 29 U/L 12 16 19       Component Value Date/Time   RBC 4.78 06/05/2020 1249   MCV 88.3 06/05/2020 1249   MCH 29.1 06/05/2020 1249   MCHC 32.9 06/05/2020 1249   RDW 14.9 06/05/2020 1249   LYMPHSABS 3.0 06/05/2020 1249   MONOABS 0.4 06/05/2020 1249   EOSABS 0.1 06/05/2020 1249   BASOSABS 0.0 06/05/2020 1249   Lab Results  Component Value Date   LDH 136 06/05/2020   LDH 137 12/12/2019   Lab Results  Component Value Date   VD25OH 49 04/16/2020   VD25OH 42 10/17/2019      DIAGNOSTIC IMAGING:  I have independently reviewed the scans and discussed with the patient. No results found.   ASSESSMENT:  1.  Erythrocytosis: -Negative for JAK2 V617F mutation.  LDH normal.  Serum erythropoietin 11.9. -Thought to be secondary to smoking.  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 -This is commonly associated with essential thrombocythemia.  Other common association is with PMF.  Also associated with MDS, AML, CML and rarely  solid tumors. -Patient's platelet count is within normal limits.  3.  Tobacco abuse: -Smokes 1 pack/day for 40 years. -CT low-dose lung cancer screening protocol on 12/13/2019 showed lung RADS 1.   PLAN:  1.  Erythrocytosis: -We reviewed labs from 06/05/2020 which showed hemoglobin of 13.9 and hematocrit 42.2. -I plan to repeat labs in 6 months.  2.  Positive CALR mutation: -Platelet count is normal at 248.  We will check labs in 6 months.  3.  Tobacco abuse: -Advised patient about quitting smoking.  We  will arrange for another CT scan in January next year.  I will see her back after the scan.  Orders placed this encounter:  No orders of the defined types were placed in this encounter.    Derek Jack, MD [place] [phone]   I, Milinda Antis, am acting as a scribe for Dr. Sanda Linger.  I, Derek Jack MD, have reviewed the above documentation for accuracy and completeness, and I agree with the above.

## 2020-06-06 ENCOUNTER — Other Ambulatory Visit (INDEPENDENT_AMBULATORY_CARE_PROVIDER_SITE_OTHER): Payer: Self-pay | Admitting: Nurse Practitioner

## 2020-06-06 DIAGNOSIS — I10 Essential (primary) hypertension: Secondary | ICD-10-CM

## 2020-06-10 ENCOUNTER — Other Ambulatory Visit (INDEPENDENT_AMBULATORY_CARE_PROVIDER_SITE_OTHER): Payer: Self-pay

## 2020-06-10 DIAGNOSIS — I10 Essential (primary) hypertension: Secondary | ICD-10-CM

## 2020-06-10 MED ORDER — HYDROCHLOROTHIAZIDE 12.5 MG PO TABS: 12.5000 mg | ORAL_TABLET | Freq: Every day | ORAL | 1 refills | Status: DC

## 2020-06-14 ENCOUNTER — Other Ambulatory Visit (INDEPENDENT_AMBULATORY_CARE_PROVIDER_SITE_OTHER): Payer: Self-pay | Admitting: Internal Medicine

## 2020-06-14 DIAGNOSIS — R5383 Other fatigue: Secondary | ICD-10-CM

## 2020-06-14 DIAGNOSIS — Z122 Encounter for screening for malignant neoplasm of respiratory organs: Secondary | ICD-10-CM

## 2020-06-14 DIAGNOSIS — I2510 Atherosclerotic heart disease of native coronary artery without angina pectoris: Secondary | ICD-10-CM

## 2020-06-14 DIAGNOSIS — L853 Xerosis cutis: Secondary | ICD-10-CM

## 2020-06-14 DIAGNOSIS — Z13220 Encounter for screening for lipoid disorders: Secondary | ICD-10-CM

## 2020-06-14 DIAGNOSIS — E559 Vitamin D deficiency, unspecified: Secondary | ICD-10-CM

## 2020-06-14 DIAGNOSIS — Z1211 Encounter for screening for malignant neoplasm of colon: Secondary | ICD-10-CM

## 2020-06-14 DIAGNOSIS — Z0001 Encounter for general adult medical examination with abnormal findings: Secondary | ICD-10-CM

## 2020-06-14 DIAGNOSIS — Z1231 Encounter for screening mammogram for malignant neoplasm of breast: Secondary | ICD-10-CM

## 2020-06-14 DIAGNOSIS — Z13228 Encounter for screening for other metabolic disorders: Secondary | ICD-10-CM

## 2020-06-14 DIAGNOSIS — Z23 Encounter for immunization: Secondary | ICD-10-CM

## 2020-06-14 DIAGNOSIS — Z130 Encounter for screening for diseases of the blood and blood-forming organs and certain disorders involving the immune mechanism: Secondary | ICD-10-CM

## 2020-06-14 DIAGNOSIS — I10 Essential (primary) hypertension: Secondary | ICD-10-CM

## 2020-06-19 ENCOUNTER — Other Ambulatory Visit (INDEPENDENT_AMBULATORY_CARE_PROVIDER_SITE_OTHER): Payer: Self-pay | Admitting: Internal Medicine

## 2020-06-19 DIAGNOSIS — Z1211 Encounter for screening for malignant neoplasm of colon: Secondary | ICD-10-CM

## 2020-06-19 DIAGNOSIS — Z23 Encounter for immunization: Secondary | ICD-10-CM

## 2020-06-19 DIAGNOSIS — Z122 Encounter for screening for malignant neoplasm of respiratory organs: Secondary | ICD-10-CM

## 2020-06-19 DIAGNOSIS — I2510 Atherosclerotic heart disease of native coronary artery without angina pectoris: Secondary | ICD-10-CM

## 2020-06-19 DIAGNOSIS — Z1231 Encounter for screening mammogram for malignant neoplasm of breast: Secondary | ICD-10-CM

## 2020-06-19 DIAGNOSIS — R5383 Other fatigue: Secondary | ICD-10-CM

## 2020-06-19 DIAGNOSIS — I10 Essential (primary) hypertension: Secondary | ICD-10-CM

## 2020-06-19 DIAGNOSIS — Z0001 Encounter for general adult medical examination with abnormal findings: Secondary | ICD-10-CM

## 2020-06-19 DIAGNOSIS — Z13220 Encounter for screening for lipoid disorders: Secondary | ICD-10-CM

## 2020-06-19 DIAGNOSIS — E559 Vitamin D deficiency, unspecified: Secondary | ICD-10-CM

## 2020-06-19 DIAGNOSIS — Z13228 Encounter for screening for other metabolic disorders: Secondary | ICD-10-CM

## 2020-06-19 DIAGNOSIS — Z130 Encounter for screening for diseases of the blood and blood-forming organs and certain disorders involving the immune mechanism: Secondary | ICD-10-CM

## 2020-06-19 DIAGNOSIS — L853 Xerosis cutis: Secondary | ICD-10-CM

## 2020-07-08 ENCOUNTER — Other Ambulatory Visit (INDEPENDENT_AMBULATORY_CARE_PROVIDER_SITE_OTHER): Payer: Self-pay

## 2020-07-08 DIAGNOSIS — I10 Essential (primary) hypertension: Secondary | ICD-10-CM

## 2020-07-08 MED ORDER — OLMESARTAN MEDOXOMIL 20 MG PO TABS: 20.0000 mg | ORAL_TABLET | Freq: Every day | ORAL | 0 refills | Status: DC

## 2020-08-26 ENCOUNTER — Ambulatory Visit (INDEPENDENT_AMBULATORY_CARE_PROVIDER_SITE_OTHER): Payer: PPO | Admitting: Internal Medicine

## 2020-09-04 ENCOUNTER — Ambulatory Visit (INDEPENDENT_AMBULATORY_CARE_PROVIDER_SITE_OTHER): Payer: PPO | Admitting: Internal Medicine

## 2020-09-04 ENCOUNTER — Encounter (INDEPENDENT_AMBULATORY_CARE_PROVIDER_SITE_OTHER): Payer: Self-pay | Admitting: Internal Medicine

## 2020-09-04 ENCOUNTER — Other Ambulatory Visit: Payer: Self-pay

## 2020-09-04 VITALS — BP 136/68 | HR 82 | Temp 96.9°F | Ht 63.0 in | Wt 155.4 lb

## 2020-09-04 DIAGNOSIS — Z23 Encounter for immunization: Secondary | ICD-10-CM

## 2020-09-04 DIAGNOSIS — I10 Essential (primary) hypertension: Secondary | ICD-10-CM | POA: Diagnosis not present

## 2020-09-04 DIAGNOSIS — E785 Hyperlipidemia, unspecified: Secondary | ICD-10-CM

## 2020-09-04 DIAGNOSIS — K5900 Constipation, unspecified: Secondary | ICD-10-CM | POA: Diagnosis not present

## 2020-09-04 DIAGNOSIS — I2510 Atherosclerotic heart disease of native coronary artery without angina pectoris: Secondary | ICD-10-CM | POA: Diagnosis not present

## 2020-09-04 MED ORDER — OLMESARTAN MEDOXOMIL 20 MG PO TABS: 20.0000 mg | ORAL_TABLET | Freq: Every day | ORAL | 1 refills | Status: DC

## 2020-09-04 MED ORDER — ATORVASTATIN CALCIUM 80 MG PO TABS: 80.0000 mg | ORAL_TABLET | Freq: Every day | ORAL | 1 refills | Status: DC

## 2020-09-04 MED ORDER — METOPROLOL TARTRATE 50 MG PO TABS: ORAL_TABLET | ORAL | 1 refills | Status: DC

## 2020-09-04 NOTE — Addendum Note (Signed)
Addended by: Anibal Henderson on: 09/04/2020 04:07 PM   Modules accepted: Orders

## 2020-09-04 NOTE — Progress Notes (Signed)
Metrics: Intervention Frequency ACO  Documented Smoking Status Yearly  Screened one or more times in 24 months  Cessation Counseling or  Active cessation medication Past 24 months  Past 24 months   Guideline developer: UpToDate (See UpToDate for funding source) Date Released: 2014       Wellness Office Visit  Subjective:  Patient ID: Leah [name], female    DOB: 10/08/1956  Age: 64 y.o. MRN: [id number]  CC: This lady comes in for follow-up of hypertension, hyperlipidemia, coronary artery disease. HPI  She is currently having trouble with some constipation and she would like to see what else can be done. She continues on antihypertensive medications listed for her hypertension. She also continues on statin therapy for hyperlipidemia in the face of coronary artery disease. She denies any chest pain, dyspnea, palpitations or limb weakness. Unfortunately, she still continues to smoke cigarettes which is caused erythrocytosis and her hematologist is also recommended that she try to quit smoking.  She is trying her best.  Past Medical History:  Diagnosis Date  . Arthritis   . Borderline diabetes   . Breast lump on right side at 3 o'clock position    Benign, fatty mass  . History of kidney stones   . History of shingles   . Hyperlipidemia   . Hypertension   . Myocardial infarction ([place]) 1996    HAD ANGIOPLASTY WITH 2 STENTS  . Tinnitus    right ear   . Vitamin D deficiency 10/17/2019  . Wears glasses    Past Surgical History:  Procedure Laterality Date  . BREAST LUMPECTOMY  >10 YRS AGO   RT BREAST  . CORONARY ANGIOPLASTY  1996   ANGIOPLASTY / 2 STENTS  . CYSTOSCOPY WITH STENT PLACEMENT Right 12/30/2015   Procedure: CYSTOSCOPY;  Surgeon: Cleon Gustin, MD;  Location: [facility];  Service: Urology;  Laterality: Right;  . CYSTOSCOPY WITH STENT PLACEMENT Right 01/09/2016   Procedure: CYSTOSCOPY WITH STENT PLACEMENT;  Surgeon: Cleon Gustin, MD;  Location: [facility];  Service:  Urology;  Laterality: Right;  . HOLMIUM LASER APPLICATION Right 03/02/4080   Procedure: HOLMIUM LASER APPLICATION;  Surgeon: Cleon Gustin, MD;  Location: [facility];  Service: Urology;  Laterality: Right;  . LITHOTRIPSY    . NEPHROLITHOTOMY Right 12/30/2015   Procedure: RIght Nephrostomy with access;  Surgeon: Cleon Gustin, MD;  Location: [facility];  Service: Urology;  Laterality: Right;  . NEPHROLITHOTOMY Right 01/09/2016   Procedure: RIGHT PERCUTANEOUS NEPHROLITHOTOMY SECOND LOOK , POSSIBLE DILATION OF TRACT;  Surgeon: Cleon Gustin, MD;  Location: [facility];  Service: Urology;  Laterality: Right;     Family History  Problem Relation Age of Onset  . Heart disease Mother   . Diabetes Mother   . Cancer Father        bone  . Cirrhosis Father   . Stroke Maternal Grandmother   . Stroke Maternal Grandfather   . Heart disease Sister   . Obesity Brother   . Healthy Daughter     Social History   Social History Narrative  . Not on file   Social History   Tobacco Use  . Smoking status: Current Every Day Smoker    Packs/day: 1.00    Years: 40.00    Pack years: 40.00    Types: Cigarettes  . Smokeless tobacco: Never Used  Substance Use Topics  . Alcohol use: No    Alcohol/week: 0.0 standard drinks    Current Meds  Medication Sig  .  aspirin EC 81 MG tablet Take 81 mg by mouth every morning.   . Blood Pressure Monitoring (BLOOD PRESSURE MONITOR AUTOMAT) DEVI 1 Device by Does not apply route daily. Check blood pressure daily  . Cholecalciferol (VITAMIN D-3) 1000 UNITS CAPS Take 1,000 Units by mouth 2 (two) times daily.   . hydrochlorothiazide (HYDRODIURIL) 12.5 MG tablet Take 1 tablet (12.5 mg total) by mouth daily.  . Multiple Vitamin (MULTIVITAMIN WITH MINERALS) TABS tablet Take 1 tablet by mouth every morning.  . Omega-3 Fatty Acids (OMEGA 3 500 PO) Take 500 mg by mouth every morning.  . vitamin B-12 (CYANOCOBALAMIN) 1000 MCG tablet Take 5,000 mcg by mouth daily.  .  vitamin E 400 UNIT capsule Take 400 Units by mouth daily.  . [DISCONTINUED] atorvastatin (LIPITOR) 80 MG tablet TAKE (1) TABLET BY MOUTH EACH MORNING.  . [DISCONTINUED] metoprolol tartrate (LOPRESSOR) 50 MG tablet TAKE ONE TABLET BY MOUTH ONCE DAILY WITH OR IMMEDIATELY FOLLOWING A MEAL.  . [DISCONTINUED] olmesartan (BENICAR) 20 MG tablet Take 1 tablet (20 mg total) by mouth daily.  Marland Kitchen atorvastatin (LIPITOR) 80 MG tablet Take 1 tablet (80 mg total) by mouth daily.  . metoprolol tartrate (LOPRESSOR) 50 MG tablet TAKE ONE TABLET BY MOUTH ONCE DAILY WITH OR IMMEDIATELY FOLLOWING A MEAL.  Marland Kitchen olmesartan (BENICAR) 20 MG tablet Take 1 tablet (20 mg total) by mouth daily.      Depression screen [facility] 2/9 04/11/2020 10/17/2019  Decreased Interest 0 0  Down, Depressed, Hopeless 0 0  PHQ - 2 Score 0 0     Objective:   Today's Vitals: BP 136/68   Pulse 82   Temp (!) 96.9 F (36.1 C) (Temporal)   Ht 5\' 3"  (1.6 m)   Wt 155 lb 6.4 oz (70.5 kg)   SpO2 98%   BMI 27.53 kg/m  Vitals with BMI 09/04/2020 06/05/2020 05/23/2020  Height 5\' 3"  - 5\' 3"   Weight 155 lbs 6 oz 161 lbs 159 lbs 10 oz  BMI 27.53 37.10 62.69  Systolic 485 462 703  Diastolic 68 78 65  Pulse 82 76 70     Physical Exam  She looks systemically well.  She has lost 6 pounds since the last visit.  Blood pressure is in good control.  She is alert and orientated without any focal logical signs.     Assessment   1. Essential hypertension   2. Coronary artery disease involving native heart without angina pectoris, unspecified vessel or lesion type   3. Hyperlipidemia, unspecified hyperlipidemia type   4. Constipation, unspecified constipation type       Tests ordered No orders of the defined types were placed in this encounter.    Plan: 1. She will continue with antihypertensive therapy as before with Benicar and metoprolol and I will send refills today. 2. She will continue with statin therapy with Lipitor and I will send a  refill today. 3. For constipation, I recommended Senokot-S over-the-counter and also the use of more fiber in her diet including eating green leafy vegetables as well as beans. 4. I will see her in 3 months time for an annual physical exam and today she was given a high-dose influenza vaccination.   Meds ordered this encounter  Medications  . atorvastatin (LIPITOR) 80 MG tablet    Sig: Take 1 tablet (80 mg total) by mouth daily.    Dispense:  90 tablet    Refill:  1  . olmesartan (BENICAR) 20 MG tablet    Sig:  Take 1 tablet (20 mg total) by mouth daily.    Dispense:  90 tablet    Refill:  1  . metoprolol tartrate (LOPRESSOR) 50 MG tablet    Sig: TAKE ONE TABLET BY MOUTH ONCE DAILY WITH OR IMMEDIATELY FOLLOWING A MEAL.    Dispense:  90 tablet    Refill:  1    Jovie Swanner Luther Parody, MD

## 2020-09-25 ENCOUNTER — Other Ambulatory Visit (HOSPITAL_COMMUNITY): Payer: Self-pay | Admitting: Internal Medicine

## 2020-09-25 DIAGNOSIS — Z1231 Encounter for screening mammogram for malignant neoplasm of breast: Secondary | ICD-10-CM

## 2020-10-04 IMAGING — MG DIGITAL SCREENING BILAT W/ TOMO W/ CAD
6 of 10 series · 6 of 30 positions shown · non-contrast
Comparison: Previous exam(s).

ACR Breast Density Category a: The breast tissue is almost entirely
fatty.

CLINICAL DATA: Screening.

EXAM:
DIGITAL SCREENING BILATERAL MAMMOGRAM WITH TOMO AND CAD

[L MLO synth-2D (1 of 2)]
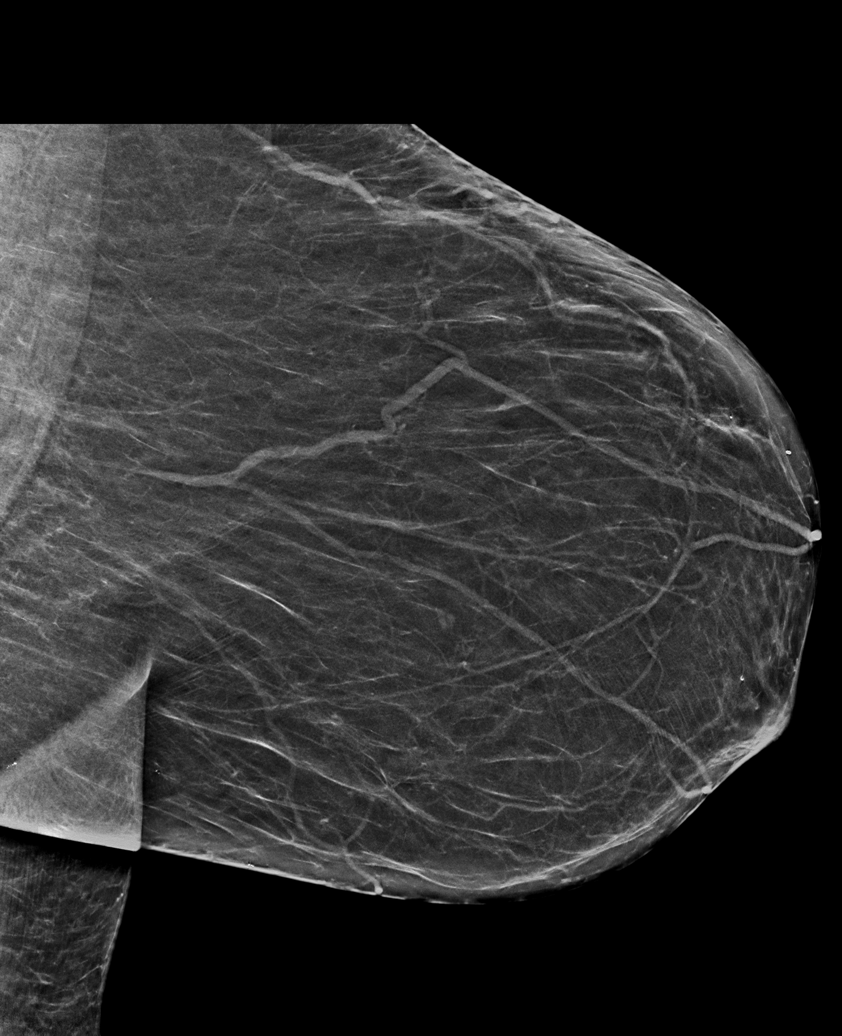

[L CC synth-2D]
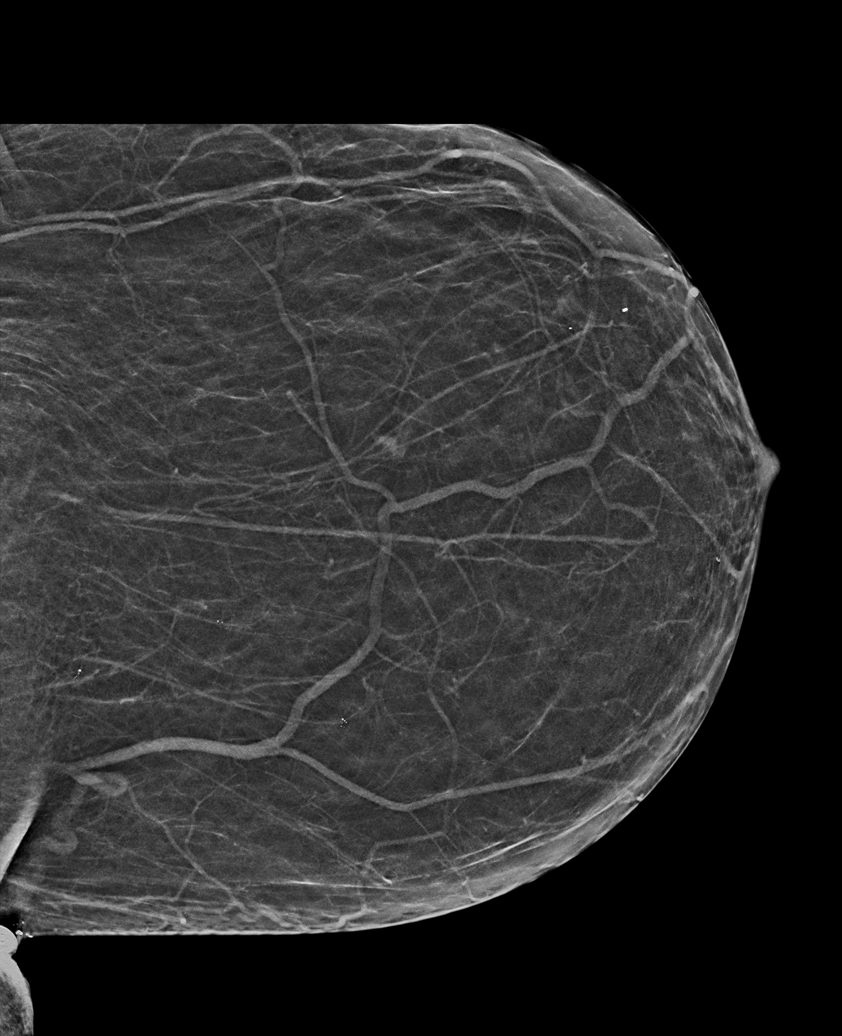

[R CC synth-2D]
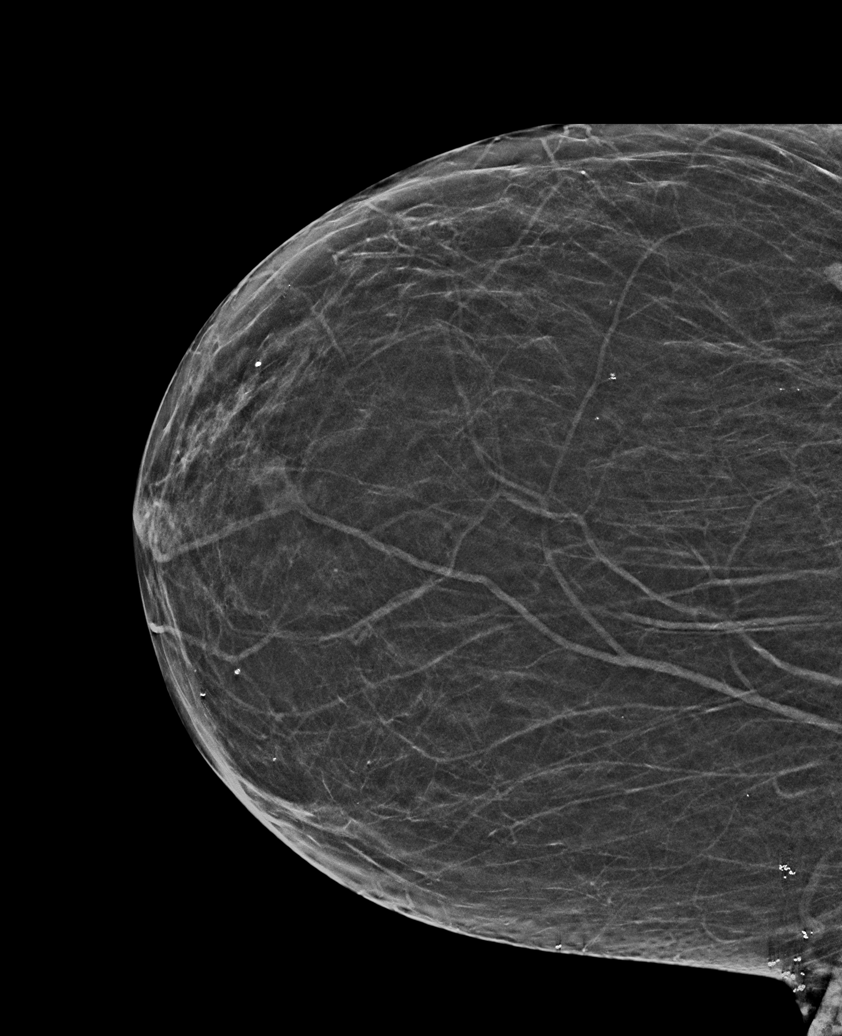

[L MLO synth-2D (2 of 2)]
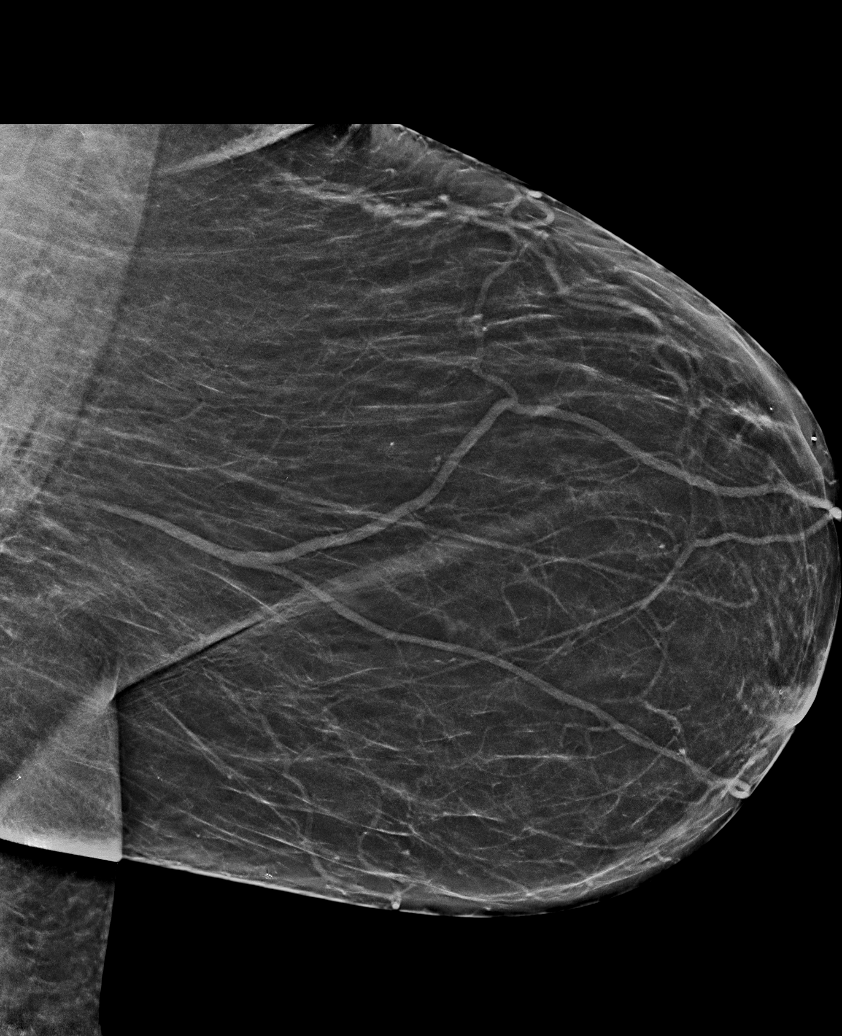

[R MLO synth-2D]
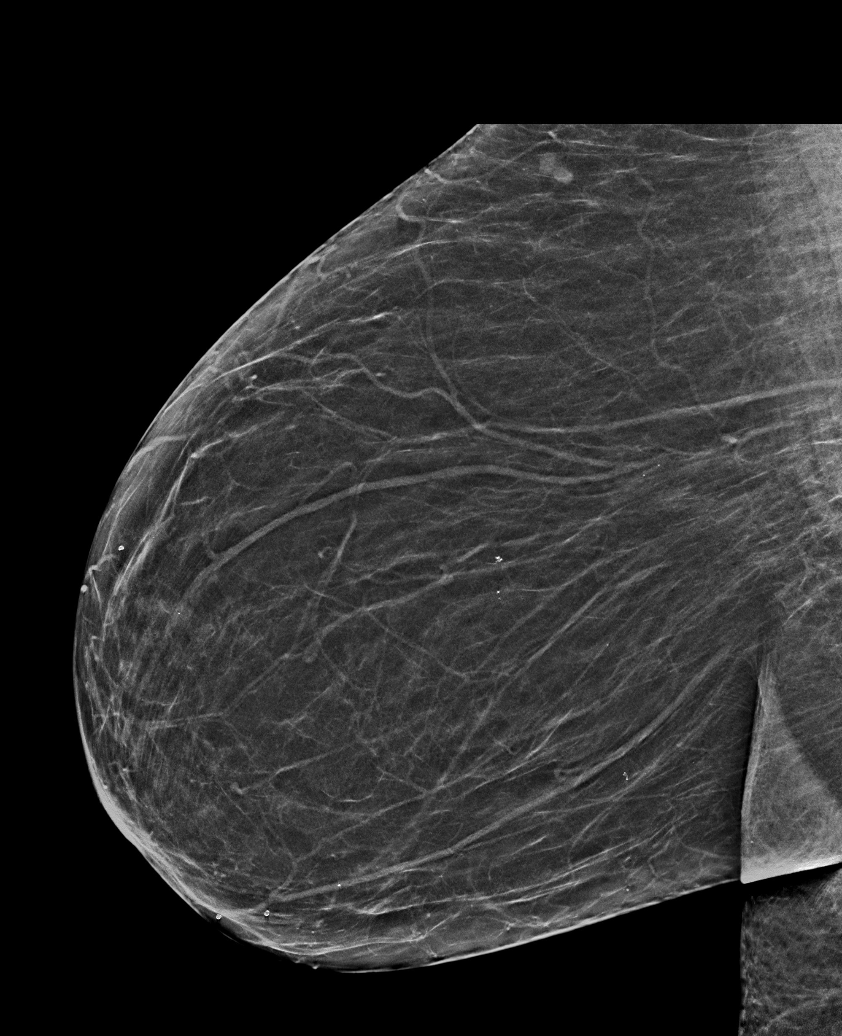

[L MLO tomo · tomo slice 31/60.0]
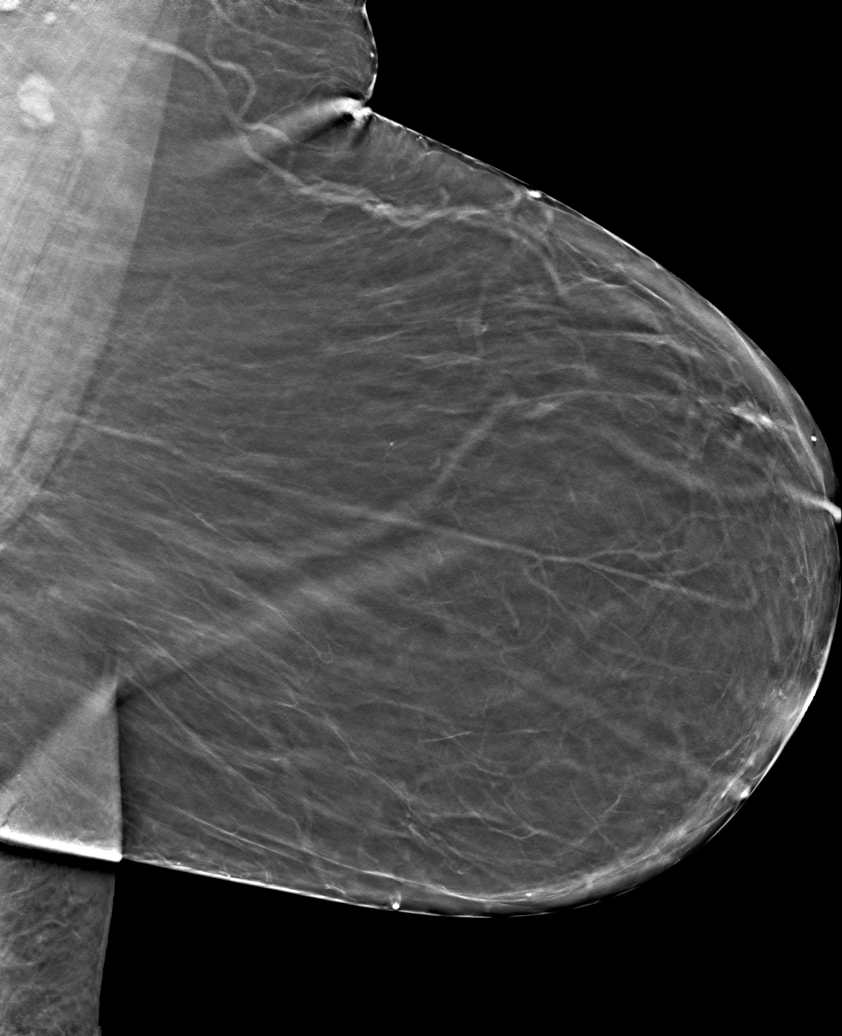

[6 of 30 positions shown; findings below may reference images not displayed]

FINDINGS: There are no findings suspicious for malignancy. Images were
processed with CAD.
IMPRESSION: No mammographic evidence of malignancy. A result letter of this
screening mammogram will be mailed directly to the patient.

RECOMMENDATION:
Screening mammogram in one year. (Code:[id number])

BI-RADS CATEGORY  1: Negative.

## 2020-11-11 ENCOUNTER — Other Ambulatory Visit: Payer: Self-pay

## 2020-11-11 ENCOUNTER — Ambulatory Visit (HOSPITAL_COMMUNITY)
Admission: RE | Admit: 2020-11-11 | Discharge: 2020-11-11 | Disposition: A | Discharge status: Home / Self Care | Payer: PPO | Source: Ambulatory Visit | Attending: Internal Medicine | Admitting: Internal Medicine

## 2020-11-11 DIAGNOSIS — Z1231 Encounter for screening mammogram for malignant neoplasm of breast: Secondary | ICD-10-CM | POA: Diagnosis not present

## 2020-12-05 ENCOUNTER — Inpatient Hospital Stay (HOSPITAL_COMMUNITY): Payer: PPO | Attending: Hematology

## 2020-12-05 ENCOUNTER — Ambulatory Visit (HOSPITAL_COMMUNITY): Admission: RE | Admit: 2020-12-05 | Payer: PPO | Source: Ambulatory Visit

## 2020-12-11 ENCOUNTER — Encounter (INDEPENDENT_AMBULATORY_CARE_PROVIDER_SITE_OTHER): Payer: Self-pay | Admitting: Internal Medicine

## 2020-12-11 ENCOUNTER — Other Ambulatory Visit: Payer: Self-pay

## 2020-12-11 ENCOUNTER — Ambulatory Visit (INDEPENDENT_AMBULATORY_CARE_PROVIDER_SITE_OTHER): Payer: PPO | Admitting: Internal Medicine

## 2020-12-11 VITALS — BP 134/80 | HR 87 | Temp 97.3°F | Ht 62.0 in | Wt 147.8 lb

## 2020-12-11 DIAGNOSIS — I10 Essential (primary) hypertension: Secondary | ICD-10-CM

## 2020-12-11 DIAGNOSIS — E785 Hyperlipidemia, unspecified: Secondary | ICD-10-CM | POA: Diagnosis not present

## 2020-12-11 DIAGNOSIS — Z1211 Encounter for screening for malignant neoplasm of colon: Secondary | ICD-10-CM | POA: Diagnosis not present

## 2020-12-11 DIAGNOSIS — Z122 Encounter for screening for malignant neoplasm of respiratory organs: Secondary | ICD-10-CM

## 2020-12-11 DIAGNOSIS — Z0001 Encounter for general adult medical examination with abnormal findings: Secondary | ICD-10-CM

## 2020-12-11 DIAGNOSIS — M8588 Other specified disorders of bone density and structure, other site: Secondary | ICD-10-CM

## 2020-12-11 DIAGNOSIS — I2510 Atherosclerotic heart disease of native coronary artery without angina pectoris: Secondary | ICD-10-CM | POA: Diagnosis not present

## 2020-12-11 DIAGNOSIS — E559 Vitamin D deficiency, unspecified: Secondary | ICD-10-CM | POA: Diagnosis not present

## 2020-12-11 NOTE — Progress Notes (Signed)
Chief Complaint: This very pleasant 65 year old lady comes in for an annual physical exam and to address her chronic conditions which are described below. HPI: She has a history of coronary artery disease having suffered a myocardial infarction in her early 33s.  She has not really seen a cardiologist for several years now and I think it is a good idea to get her checked out at least once a year. She has hypertension for which he takes medications and is compliant with these medications. She has statin therapy for dyslipidemia in the face of coronary artery disease. She has vitamin D deficiency and continues on vitamin D3 supplementation. She denies any chest pain, dyspnea, palpitations or limb weakness. Unfortunately, she continues to smoke cigarettes and does not really want to quit.  She has had a CT chest scan as a screening procedure for lung cancer about 1 year ago and this was negative. She also has erythrocytosis which is likely due to her tobacco abuse and sees Dr. Delton Coombes.  Past Medical History:  Diagnosis Date  . Arthritis   . Borderline diabetes   . Breast lump on right side at 3 o'clock position    Benign, fatty mass  . History of kidney stones   . History of shingles   . Hyperlipidemia   . Hypertension   . Myocardial infarction ([place]) 1996    HAD ANGIOPLASTY WITH 2 STENTS  . Tinnitus    right ear   . Vitamin D deficiency 10/17/2019  . Wears glasses    Past Surgical History:  Procedure Laterality Date  . BREAST LUMPECTOMY  >10 YRS AGO   RT BREAST  . CORONARY ANGIOPLASTY  1996   ANGIOPLASTY / 2 STENTS  . CYSTOSCOPY WITH STENT PLACEMENT Right 12/30/2015   Procedure: CYSTOSCOPY;  Surgeon: Cleon Gustin, MD;  [facility];  Service: Urology;  Laterality: Right;  . CYSTOSCOPY WITH STENT PLACEMENT Right 01/09/2016   Procedure: CYSTOSCOPY WITH STENT PLACEMENT;  Surgeon: Cleon Gustin, MD;  [facility]: [facility];  Service: Urology;  Laterality: Right;  . HOLMIUM  LASER APPLICATION Right 6/81/1572   Procedure: HOLMIUM LASER APPLICATION;  Surgeon: Cleon Gustin, MD;  Location[facility];  Service: Urology;  Laterality: Right;  . LITHOTRIPSY    . NEPHROLITHOTOMY Right 12/30/2015   Procedure: RIght Nephrostomy with access;  Surgeon: Cleon Gustin, MD;  Location[facility];  Service: Urology;  Laterality: Right;  . NEPHROLITHOTOMY Right 01/09/2016   Procedure: RIGHT PERCUTANEOUS NEPHROLITHOTOMY SECOND LOOK , POSSIBLE DILATION OF TRACT;  Surgeon: Cleon Gustin, MD;  Location[facility];  Service: Urology;  Laterality: Right;     Social History   Social History Narrative   Single,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    Social History   Tobacco Use  . Smoking status: Current Every Day Smoker    Packs/day: 1.00    Years: 40.00    Pack years: 40.00    Types: Cigarettes  . Smokeless tobacco: Never Used  Substance Use Topics  . Alcohol use: No    Alcohol/week: 0.0 standard drinks      Allergies:  Allergies  Allergen Reactions  . Codeine     REACTION: " Makes me high and blacked out in the 1980s"     Current Meds  Medication Sig  . amLODipine (NORVASC) 10 MG tablet Take 1 tablet (10 mg total) by mouth daily.  Marland Kitchen aspirin EC 81 MG tablet Take 81 mg by mouth every morning.   Marland Kitchen atorvastatin (LIPITOR) 80 MG  tablet Take 1 tablet (80 mg total) by mouth daily.  . Blood Pressure Monitoring (BLOOD PRESSURE MONITOR AUTOMAT) DEVI 1 Device by Does not apply route daily. Check blood pressure daily  . Cholecalciferol (VITAMIN D-3) 1000 UNITS CAPS Take 1,000 Units by mouth 2 (two) times daily.   . hydrochlorothiazide (HYDRODIURIL) 12.5 MG tablet Take 1 tablet (12.5 mg total) by mouth daily.  . metoprolol tartrate (LOPRESSOR) 50 MG tablet TAKE ONE TABLET BY MOUTH ONCE DAILY WITH OR IMMEDIATELY FOLLOWING A MEAL.  . Multiple Vitamin (MULTIVITAMIN WITH MINERALS) TABS tablet Take 1 tablet by mouth every morning.  . olmesartan (BENICAR) 20 MG tablet Take 1  tablet (20 mg total) by mouth daily.  . Omega-3 Fatty Acids (OMEGA 3 500 PO) Take 500 mg by mouth every morning.  . vitamin B-12 (CYANOCOBALAMIN) 1000 MCG tablet Take 5,000 mcg by mouth daily.  . vitamin E 400 UNIT capsule Take 400 Units by mouth daily.       Depression screen [facility] 2/9 12/11/2020 04/11/2020 10/17/2019  Decreased Interest 0 0 0  Down, Depressed, Hopeless 0 0 0  PHQ - 2 Score 0 0 0  Altered sleeping 0 - -  Tired, decreased energy 0 - -  Change in appetite 0 - -  Feeling bad or failure about yourself  0 - -  Trouble concentrating 0 - -  Moving slowly or fidgety/restless 0 - -  Suicidal thoughts 0 - -  PHQ-9 Score 0 - -  Difficult doing work/chores Not difficult at all - -     [id number] from the symptoms mentioned above,there are no other symptoms referable to all systems reviewed.       Physical Exam: Blood pressure 134/80, pulse 87, temperature (!) 97.3 F (36.3 C), temperature source Temporal, height 5\' 2"  (1.575 m), weight 147 lb 12.8 oz (67 kg), SpO2 99 %. Vitals with BMI 12/11/2020 09/04/2020 06/05/2020  Height 5\' 2"  5\' 3"  -  Weight 147 lbs 13 oz 155 lbs 6 oz 161 lbs  BMI 27.03 [id number]  Systolic [id number]  Diastolic 80 68 78  Pulse 87 82 76      She looks systemically well, slightly overweight.  She has lost 8 pounds since the last visit. General: Alert, cooperative, and appears to be the stated age.No pallor.  No jaundice.  No clubbing. Head: Normocephalic Eyes: Sclera white, pupils equal and reactive to light, red reflex x 2,  Ears: Normal bilaterally Oral cavity: Lips, mucosa, and tongue normal: Teeth and gums normal Neck: No adenopathy, supple, symmetrical, trachea midline, and thyroid does not appear enlarged. Breast: No masses felt. Respiratory: Clear to auscultation bilaterally.No wheezing, crackles or bronchial breathing. Cardiovascular: Heart sounds are present and appear to be normal without murmurs or added sounds.  No carotid  bruits.  Peripheral pulses are present and equal bilaterally.: Gastrointestinal:positive bowel sounds, no hepatosplenomegaly.  No masses felt.No tenderness. Skin: Clear, No rashes noted.No worrisome skin lesions seen.  Dry skin. Neurological: Grossly intact without focal findings, cranial nerves II through XII intact, muscle strength equal bilaterally Musculoskeletal: No acute joint abnormalities noted.Full range of movement noted with joints. Psychiatric: Affect appropriate, non-anxious.    Assessment  1. Encounter for general adult medical examination with abnormal findings   2. Colon cancer screening   3. Encounter for screening for malignant neoplasm of respiratory organs   4. Essential hypertension   5. Coronary artery disease involving native heart without angina pectoris, unspecified vessel or lesion type   6.  Hyperlipidemia, unspecified hyperlipidemia type   7. Osteopenia of lumbar spine   8. Vitamin D deficiency disease     Tests Ordered:   Orders Placed This Encounter  Procedures  . CT CHEST LUNG CA SCREEN LOW DOSE W/O CM  . DG Bone Density  . CBC  . COMPLETE METABOLIC PANEL WITH GFR  . Lipid panel  . T3, free  . T4, free  . TSH  . VITAMIN D 25 Hydroxy (Vit-D Deficiency, Fractures)  . Ambulatory referral to Gastroenterology  . Ambulatory referral to Cardiology     Plan  1. Blood work is ordered. 2. Reasonably healthy 65 year old lady with chronic diseases of hypertension, coronary artery disease and dyslipidemia. 3. I will refer to cardiology for an annual checkup. 4. I will refer to gastroenterology for colonoscopy. 5. We will set her up for a bone density and CT chest scan for lung cancer screening. 6. Further recommendations will depend on blood results and other results and I will have her follow-up with Sarah in about 3 months time. 7. Today, in addition to a preventative visit, I performed an office visit to address all her chronic conditions  above.     No orders of the defined types were placed in this encounter.    Aleeah Greeno C Chavon Lucarelli   12/11/2020, 11:11 AM

## 2020-12-12 ENCOUNTER — Ambulatory Visit (HOSPITAL_COMMUNITY): Payer: PPO | Admitting: Hematology

## 2020-12-12 LAB — COMPLETE METABOLIC PANEL WITH GFR
AG Ratio: 1.3 (calc) (ref 1.0–2.5)
ALT: 9 U/L (ref 6–29)
AST: 16 U/L (ref 10–35)
Albumin: 3.9 g/dL (ref 3.6–5.1)
Alkaline phosphatase (APISO): 115 U/L (ref 37–153)
BUN: 9 mg/dL (ref 7–25)
CO2: 29 mmol/L (ref 20–32)
Calcium: 9.5 mg/dL (ref 8.6–10.4)
Chloride: 102 mmol/L (ref 98–110)
Creat: 0.92 mg/dL (ref 0.50–0.99)
GFR, Est African American: 75 mL/min/{1.73_m2} (ref >=60)
GFR, Est Non African American: 65 mL/min/{1.73_m2} (ref >=60)
Globulin: 2.9 g/dL (calc) (ref 1.9–3.7)
Glucose, Bld: 109 mg/dL — ABNORMAL HIGH (ref 65–99)
Potassium: 4 mmol/L (ref 3.5–5.3)
Sodium: 139 mmol/L (ref 135–146)
Total Bilirubin: 0.4 mg/dL (ref 0.2–1.2)
Total Protein: 6.8 g/dL (ref 6.1–8.1)

## 2020-12-12 LAB — T4, FREE: Free T4: 1.2 ng/dL (ref 0.8–1.8)

## 2020-12-12 LAB — VITAMIN D 25 HYDROXY (VIT D DEFICIENCY, FRACTURES): Vit D, 25-Hydroxy: 58 ng/mL (ref 30–100)

## 2020-12-12 LAB — LIPID PANEL
Cholesterol: 150 mg/dL (ref <200)
HDL: 48 mg/dL — ABNORMAL LOW (ref >=50)
LDL Cholesterol (Calc): 85 mg/dL (calc)
Non-HDL Cholesterol (Calc): 102 mg/dL (calc) (ref <130)
Total CHOL/HDL Ratio: 3.1 (calc) (ref <5.0)
Triglycerides: 80 mg/dL (ref <150)

## 2020-12-12 LAB — CBC
HCT: 45.9 % — ABNORMAL HIGH (ref 35.0–45.0)
Hemoglobin: 15.7 g/dL — ABNORMAL HIGH (ref 11.7–15.5)
MCH: 30.7 pg (ref 27.0–33.0)
MCHC: 34.2 g/dL (ref 32.0–36.0)
MCV: 89.8 fL (ref 80.0–100.0)
MPV: 11.1 fL (ref 7.5–12.5)
Platelets: 244 10*3/uL (ref 140–400)
RBC: 5.11 10*6/uL — ABNORMAL HIGH (ref 3.80–5.10)
RDW: 13.5 % (ref 11.0–15.0)
WBC: 5.6 10*3/uL (ref 3.8–10.8)

## 2020-12-12 LAB — TSH: TSH: 2.13 mIU/L (ref 0.40–4.50)

## 2020-12-12 LAB — T3, FREE: T3, Free: 3.1 pg/mL (ref 2.3–4.2)

## 2020-12-17 ENCOUNTER — Encounter: Payer: Self-pay | Admitting: Internal Medicine

## 2020-12-20 ENCOUNTER — Other Ambulatory Visit (INDEPENDENT_AMBULATORY_CARE_PROVIDER_SITE_OTHER): Payer: Self-pay | Admitting: Internal Medicine

## 2021-01-07 ENCOUNTER — Other Ambulatory Visit (HOSPITAL_COMMUNITY): Payer: PPO

## 2021-01-07 ENCOUNTER — Ambulatory Visit (HOSPITAL_COMMUNITY): Admission: RE | Admit: 2021-01-07 | Payer: PPO | Source: Ambulatory Visit

## 2021-01-08 ENCOUNTER — Other Ambulatory Visit (INDEPENDENT_AMBULATORY_CARE_PROVIDER_SITE_OTHER): Payer: Self-pay | Admitting: Internal Medicine

## 2021-01-08 DIAGNOSIS — I2510 Atherosclerotic heart disease of native coronary artery without angina pectoris: Secondary | ICD-10-CM

## 2021-01-08 DIAGNOSIS — I10 Essential (primary) hypertension: Secondary | ICD-10-CM

## 2021-01-08 DIAGNOSIS — Z122 Encounter for screening for malignant neoplasm of respiratory organs: Secondary | ICD-10-CM

## 2021-01-13 ENCOUNTER — Other Ambulatory Visit (INDEPENDENT_AMBULATORY_CARE_PROVIDER_SITE_OTHER): Payer: Self-pay | Admitting: Internal Medicine

## 2021-01-13 ENCOUNTER — Telehealth (INDEPENDENT_AMBULATORY_CARE_PROVIDER_SITE_OTHER): Payer: Self-pay

## 2021-01-13 DIAGNOSIS — I10 Essential (primary) hypertension: Secondary | ICD-10-CM

## 2021-01-13 MED ORDER — HYDROCHLOROTHIAZIDE 12.5 MG PO TABS: 12.5000 mg | ORAL_TABLET | Freq: Every day | ORAL | 0 refills | Status: DC

## 2021-01-13 MED ORDER — HYDROCHLOROTHIAZIDE 12.5 MG PO TABS: 12.5000 mg | ORAL_TABLET | Freq: Every day | ORAL | 1 refills | Status: DC

## 2021-01-13 NOTE — Telephone Encounter (Signed)
Called patient and gave her the message that her medication has been sent to the pharmacy. Patient verbalized an understanding.

## 2021-01-13 NOTE — Telephone Encounter (Signed)
Patient called and left a detailed voice message that she needs a refill of the following medication:  hydrochlorothiazide (HYDRODIURIL) 12.5 MG tablet  Last filled 06/10/2020, # 90 with 1 refill  Last OV 12/11/2020  Next OV 04/10/2021

## 2021-01-13 NOTE — Telephone Encounter (Signed)
Refill sent to [facility]

## 2021-01-27 ENCOUNTER — Other Ambulatory Visit (INDEPENDENT_AMBULATORY_CARE_PROVIDER_SITE_OTHER): Payer: Self-pay | Admitting: Internal Medicine

## 2021-01-27 DIAGNOSIS — I10 Essential (primary) hypertension: Secondary | ICD-10-CM

## 2021-01-28 ENCOUNTER — Ambulatory Visit (INDEPENDENT_AMBULATORY_CARE_PROVIDER_SITE_OTHER): Payer: Self-pay | Admitting: *Deleted

## 2021-01-28 ENCOUNTER — Other Ambulatory Visit: Payer: Self-pay

## 2021-01-28 VITALS — Ht 63.0 in | Wt 152.0 lb

## 2021-01-28 DIAGNOSIS — Z1211 Encounter for screening for malignant neoplasm of colon: Secondary | ICD-10-CM

## 2021-01-28 MED ORDER — PEG 3350-KCL-NA BICARB-NACL 420 G PO SOLR: 4000.0000 mL | Freq: Once | ORAL | 0 refills | Status: AC

## 2021-01-28 NOTE — Patient Instructions (Signed)
Remember covid test: 02/19/2021 at 8:30 AM.    Leah [name]   06/20/1956 MRN: [id number] Procedure Date: 02/21/2021  Arrival Time:   You will receive a call from the hospital a few days before your procedure.    Location of Procedure: [facility]  PREPARATION FOR COLONOSCOPY WITH TRI-LYTE PREP   Please notify us immediately if you are diabetic, take iron supplements, or if you are on Coumadin or any other blood thinners.   Please hold the following medications:  n/a  You will need to purchase 1 fleet enema and 1 box of Bisacodyl 5mg  tablets.   PROCEDURE IS SCHEDULED FOR [name] AS FOLLOWS:  Procedure Date: 02/21/2021 Time to register: You will receive a call from the hospital a few days before your procedure. Place to register: [facility] Scheduled provider: Dr. Abbey Chatters   2 DAYS BEFORE PROCEDURE:  DATE: 02/19/2021   DAY: Wednesday Begin clear liquid diet AFTER your lunch meal. NO SOLID FOODS!   1 DAY BEFORE PROCEDURE:  DATE: 02/20/2021   DAY: Thursday  Continue clear liquids the entire day - NO SOLID FOOD.   Diabetic medications adjustments for today: n/a  At 12:00pm (noon): Take 2 (two) Dulcolax (Bisacodyl) tablets  At 2:00pm: Start drinking your solution. Try to drink 1 (one) 8 ounce glass every 10-15 minutes, until you have consumed HALF the jug. (You should complete the first 1/2 of the jug in 2 hours. Wait 30 minutes, then drink 3-4 more glasses of the solution. Your stools should be clear; if not, you may have to consume the rest of the jug.   One hour after completing the solution: take the last 2 (two) Dulcolax (Bisacodyl) tablets, with a clear liquid.  YOU MUST DRINK PLENTY OF CLEAR LIQUIDS DURING YOUR PREP TO REDUCE RISKS OF KIDNEY FAILURE.   Continue clear liquids only, until midnight. Do not eat or drink anything after midnight.  EXCEPTION:  If you take medications for your heart, blood pressure or breathing, you may take these medications  with a small amount of clear liquid.      DAY OF PROCEDURE:   DATE: 02/21/2021      DAY: Friday The morning of your procedure give yourself 1 (one) Fleet Enema, at least 1 hour before going to the hospital.   You may take Tylenol products. Please continue your regular medications unless we have instructed otherwise.   Diabetic medications adjustments for today. n/a  Someone MUST be available to drive you home; the hospital will cancel this appointment if you do not have a driver.   Please call the office if you have any questions (Dept: [phone]).  Please see below for Dietary Information.  CLEAR LIQUIDS INCLUDE:  Water Jello (NOT red in color)   Ice Popsicles (NOT red in color)   Tea (sugar ok, no milk/cream) Powdered fruit flavored drinks  Coffee (sugar ok, no milk/cream) Gatorade/ Lemonade/ Kool-Aid  (NOT red in color)   Juice: apple, white grape, white cranberry Soft drinks  Clear bullion, consomme, broth (fat free beef/chicken/vegetable)  Carbonated beverages (any kind)  Strained chicken noodle soup Hard Candy   REMEMBER: Clear liquids are liquids that will allow you to see your fingers on the other side of a clear glass. Be sure liquids are NOT red in color, and not cloudy, but CLEAR.   DO NOT EAT OR DRINK ANY OF THE FOLLOWING:  Dairy products of any kind   Cranberry juice Tomato juice /  V8 juice   Grapefruit juice Orange juice     Red grape juice  Do not eat any solid foods, including such foods as: cereal, oatmeal, yogurt, fruits, vegetables, creamed soups, eggs, bread, etc.    HELPFUL HINTS FOR DRINKING PREP SOLUTION:   Make sure prep is extremely cold. Refrigerate the night before. You may also put in the freezer.   You may try mixing some Crystal Light or Country Time Lemonade if you prefer. Mix in small amounts; add more if necessary.  Try drinking through a straw  Rinse mouth with water or a mouthwash between glasses, to remove after-taste.  Try  sipping on a cold beverage /ice/ popsicles between glasses of prep  Place a piece of sugar-free hard candy in mouth between glasses  If you become nauseated, try consuming smaller amounts, or stretch out the time between glasses. Stop for 30-60 minutes, then slowly start back drinking    You may call the office (Dept: [phone]) before 5:00pm, or page the doctor on call after 5:00pm ([phone]), for further instructions, if necessary.   OTHER INSTRUCTIONS  You will need a responsible adult at least 67 years of age to accompany you and drive you home. This person must remain in the waiting room during your procedure.  Wear loose fitting clothing that is easily removed.  Leave jewelry and other valuables at home.   Remove all body piercing jewelry and leave at home.  Total time from sign-in until discharge is approximately 2-3 hours.  You should go home directly after your procedure and rest. You can resume normal activities the day after your procedure.  The day of your procedure you should not:  Drive  Make legal decisions  Operate machinery  Drink alcohol  Return to work

## 2021-01-28 NOTE — Progress Notes (Signed)
Gastroenterology Pre-Procedure Review  Request Date: 01/28/2021 Requesting Physician: Dr. Anastasio Champion, Last TCS done 15 years ago by Dr. Tamala Julian, one polyp per pt  PATIENT REVIEW QUESTIONS: The patient responded to the following health history questions as indicated:    1. Diabetes Melitis: no 2. Joint replacements in the past 12 months: no 3. Major health problems in the past 3 months: no 4. Has an artificial valve or MVP: no 5. Has a defibrillator: no 6. Has been advised in past to take antibiotics in advance of a procedure like teeth cleaning: no 7. Family history of colon cancer: no  8. Alcohol Use: no 9. Illicit drug Use: no 10. History of sleep apnea: no  11. History of coronary artery or other vascular stents placed within the last 12 months: no 12. History of any prior anesthesia complications: no 13. Body mass index is 26.93 kg/m.    MEDICATIONS & ALLERGIES:    Patient reports the following regarding taking any blood thinners:   Plavix? no Aspirin? yes Coumadin? no Brilinta? no Xarelto? no Eliquis? no Pradaxa? no Savaysa? no Effient? no  Patient confirms/reports the following medications:  Current Outpatient Medications  Medication Sig Dispense Refill  . amLODipine (NORVASC) 10 MG tablet TAKE ONE TABLET BY MOUTH ONCE DAILY. 90 tablet 0  . aspirin EC 81 MG tablet Take 81 mg by mouth every morning.     Marland Kitchen atorvastatin (LIPITOR) 80 MG tablet TAKE (1) TABLET BY MOUTH EACH MORNING. 90 tablet 0  . Blood Pressure Monitoring (BLOOD PRESSURE MONITOR AUTOMAT) DEVI 1 Device by Does not apply route daily. Check blood pressure daily 1 each 0  . Cholecalciferol (VITAMIN D-3) 1000 UNITS CAPS Take 1,000 Units by mouth 2 (two) times daily.     . hydrochlorothiazide (HYDRODIURIL) 12.5 MG tablet Take 1 tablet (12.5 mg total) by mouth daily. 90 tablet 0  . metoprolol tartrate (LOPRESSOR) 50 MG tablet TAKE ONE TABLET BY MOUTH ONCE DAILY WITH OR IMMEDIATELY FOLLOWING A MEAL. 90 tablet 1  .  Multiple Vitamin (MULTIVITAMIN WITH MINERALS) TABS tablet Take 1 tablet by mouth every morning.    . olmesartan (BENICAR) 20 MG tablet TAKE (1) TABLET BY MOUTH ONCE DAILY. 90 tablet 0  . Omega-3 Fatty Acids (OMEGA 3 500 PO) Take 500 mg by mouth every morning.    . vitamin B-12 (CYANOCOBALAMIN) 1000 MCG tablet Take 5,000 mcg by mouth daily.    . vitamin E 400 UNIT capsule Take 400 Units by mouth daily.     No current facility-administered medications for this visit.    Patient confirms/reports the following allergies:  Allergies  Allergen Reactions  . Codeine     REACTION: " Makes me high and blacked out in the 1980s"    No orders of the defined types were placed in this encounter.   AUTHORIZATION INFORMATION Primary Insurance: [employer],  ID #: [id number] Pre-Cert / Josem Kaufmann required: No, not required  SCHEDULE INFORMATION: Procedure has been scheduled as follows:  Date: 02/21/2021, Time: AM procedure Location: [facility] with Dr. Abbey Chatters  This Gastroenterology Pre-Precedure Review Form is being routed to the following provider(s): Roseanne Kaufman, NP

## 2021-01-31 NOTE — Progress Notes (Signed)
ASA 3. Appropriate.  °

## 2021-02-03 ENCOUNTER — Telehealth: Payer: Self-pay | Admitting: *Deleted

## 2021-02-03 ENCOUNTER — Encounter: Payer: Self-pay | Admitting: *Deleted

## 2021-02-03 NOTE — Telephone Encounter (Signed)
Lmom for pt to call me back.  Needs ov with provider to arrange TCS.

## 2021-02-03 NOTE — Telephone Encounter (Signed)
Pt called back.  Scheduled ov for 03/17/2021 at 10:30 with Neil Crouch, PA.

## 2021-02-03 NOTE — Progress Notes (Signed)
Spoke to pt and informed her that we needed to cancel her procedure.  She was made aware that we will reschedule it at her ov with provider.  She was informed that it was due to her medical hx (ASA III).  Pt voiced understanding.  OV made for 03/17/2021 at 10:30 with Neil Crouch, PA.

## 2021-02-06 NOTE — Progress Notes (Addendum)
Cardiology Office Note    Date:  02/07/2021   ID:  AAMANI [name], DOB 09-14-1956, MRN [id number]  PCP:  Ailene Ards, NP  Cardiologist:  Carlyle Dolly, MD  Electrophysiologist:  None   Chief Complaint: f/u CAD  History of Present Illness:   Leah [name] is a 65 y.o. female with history of HTN, CAD, HLD (followed by PCP), borderline DM who presents for cardiac follow-up. Per Dr. Nelly Laurence notes, "epic notes mention prior stenting about 25 years ago. From further review she had an MI in 1998, received stenting to her RCA." No prior cath or echo notes for review. She reports this was at [facility] many years ago and she's done great since.  She is seen back for follow-up doing great. She does not formally exercise but stays active chasing around a grandchild she helps care for. She used to be in the army for 15 years. During Covid she was fearful of gaining weight so worked on changing her diet. She is down 15lb from OV last year. She has not had any concerning cardiac symptoms, states she feels great. She does continue to smoke. She has smoked since age 53.  Of note she is on Lopressor once daily in her regimen.  Labwork independently reviewed: 11/2020 LDL 85, CMET OK except glu 109, Hgb 15.7, TSH wnl  Past Medical History:  Diagnosis Date  . Arthritis   . Borderline diabetes   . Breast lump on right side at 3 o'clock position    Benign, fatty mass  . History of kidney stones   . History of shingles   . Hyperlipidemia   . Hypertension   . Myocardial infarction ([place]) 1996    HAD ANGIOPLASTY WITH 2 STENTS  . Tinnitus    right ear   . Vitamin D deficiency 10/17/2019  . Wears glasses     Past Surgical History:  Procedure Laterality Date  . BREAST LUMPECTOMY  >10 YRS AGO   RT BREAST  . CORONARY ANGIOPLASTY  1996   ANGIOPLASTY / 2 STENTS  . CYSTOSCOPY WITH STENT PLACEMENT Right 12/30/2015   Procedure: CYSTOSCOPY;  Surgeon: Cleon Gustin, MD;  Location: [facility];   Service: Urology;  Laterality: Right;  . CYSTOSCOPY WITH STENT PLACEMENT Right 01/09/2016   Procedure: CYSTOSCOPY WITH STENT PLACEMENT;  Surgeon: Cleon Gustin, MD;  Location: [facility];  Service: Urology;  Laterality: Right;  . HOLMIUM LASER APPLICATION Right 6/44/0347   Procedure: HOLMIUM LASER APPLICATION;  Surgeon: Cleon Gustin, MD;  Location: [facility];  Service: Urology;  Laterality: Right;  . LITHOTRIPSY    . NEPHROLITHOTOMY Right 12/30/2015   Procedure: RIght Nephrostomy with access;  Surgeon: Cleon Gustin, MD;  [facility];  Service: Urology;  Laterality: Right;  . NEPHROLITHOTOMY Right 01/09/2016   Procedure: RIGHT PERCUTANEOUS NEPHROLITHOTOMY SECOND LOOK , POSSIBLE DILATION OF TRACT;  Surgeon: Cleon Gustin, MD;  [facility];  Service: Urology;  Laterality: Right;    Current Medications: Current Meds  Medication Sig  . amLODipine (NORVASC) 10 MG tablet TAKE ONE TABLET BY MOUTH ONCE DAILY.  Marland Kitchen aspirin EC 81 MG tablet Take 81 mg by mouth every morning.   Marland Kitchen atorvastatin (LIPITOR) 80 MG tablet TAKE (1) TABLET BY MOUTH EACH MORNING.  Marland Kitchen Cholecalciferol (VITAMIN D-3) 1000 UNITS CAPS Take 1,000 Units by mouth 2 (two) times daily.   . hydrochlorothiazide (HYDRODIURIL) 12.5 MG tablet Take 1 tablet (12.5 mg total) by mouth daily.  Marland Kitchen  metoprolol tartrate (LOPRESSOR) 50 MG tablet TAKE ONE TABLET BY MOUTH ONCE DAILY WITH OR IMMEDIATELY FOLLOWING A MEAL.  . Multiple Vitamin (MULTIVITAMIN WITH MINERALS) TABS tablet Take 1 tablet by mouth every morning.  . olmesartan (BENICAR) 20 MG tablet TAKE (1) TABLET BY MOUTH ONCE DAILY.  Marland Kitchen Omega-3 Fatty Acids (OMEGA 3 500 PO) Take 500 mg by mouth every morning.  . vitamin B-12 (CYANOCOBALAMIN) 1000 MCG tablet Take 5,000 mcg by mouth daily.  . vitamin E 400 UNIT capsule Take 400 Units by mouth daily.     Allergies:   Codeine   Social History   Socioeconomic History  . Marital status: Widowed    Spouse name: Not on file  . Number  of children: 1  . Years of education: Not on file  . Highest education level: Not on file  Occupational History  . Occupation: Retired  Tobacco Use  . Smoking status: Current Every Day Smoker    Packs/day: 1.00    Years: 40.00    Pack years: 40.00    Types: Cigarettes  . Smokeless tobacco: Never Used  Vaping Use  . Vaping Use: Never used  Substance and Sexual Activity  . Alcohol use: No    Alcohol/week: 0.0 standard drinks  . Drug use: No  . Sexual activity: Not Currently  Other Topics Concern  . 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   Social Determinants of Health   Financial Resource Strain: Not on file  Food Insecurity: Not on file  Transportation Needs: Not on file  Physical Activity: Not on file  Stress: Not on file  Social Connections: Not on file     Family History:  The patient's family history includes Cancer in her father; Cirrhosis in her father; Diabetes in her mother; Healthy in her daughter; Heart disease in her mother and sister; Obesity in her brother; Stroke in her maternal grandfather and maternal grandmother.  ROS:   Please see the history of present illness.  All other systems are reviewed and otherwise negative.    EKGs/Labs/Other Studies Reviewed:    Studies reviewed are outlined and summarized above. Reports included below if pertinent.  N/A    EKG:  EKG is ordered today, personally reviewed, demonstrating NSR 92bpm, occasional PACs, nonspecific STTW changes similar to prior  Recent Labs: 12/11/2020: ALT 9; BUN 9; Creat 0.92; Hemoglobin 15.7; Platelets 244; Potassium 4.0; Sodium 139; TSH 2.13  Recent Lipid Panel    Component Value Date/Time   CHOL 150 12/11/2020 0000   TRIG 80 12/11/2020 0000   HDL 48 (L) 12/11/2020 0000   CHOLHDL 3.1 12/11/2020 0000   VLDL 21 10/10/2008 2241   LDLCALC 85 12/11/2020 0000    PHYSICAL EXAM:    VS:  BP 140/60   Pulse 90   Ht 5\' 3"  (1.6 m)   Wt 151 lb  (68.5 kg)   LMP 05/16/2002   SpO2 100%   BMI 26.75 kg/m   BMI: Body mass index is 26.75 kg/m.  GEN: Well nourished, well developed female in no acute distress, appears younger than stated age 80: normocephalic, atraumatic Neck: no JVD, carotid bruits, or masses Cardiac: RRR; no murmurs, rubs, or gallops, no edema  Respiratory:  clear to auscultation bilaterally, normal work of breathing GI: soft, nontender, nondistended, + BS MS: no deformity or atrophy Skin: warm and dry, no rash Neuro:  Alert and Oriented x 3, Strength and sensation are intact, follows commands Psych: euthymic mood, full affect  Wt Readings from Last 3 Encounters:  02/07/21 151 lb (68.5 kg)  01/28/21 152 lb (68.9 kg)  12/11/20 147 lb 12.8 oz (67 kg)     ASSESSMENT & PLAN:   1. CAD - doing well without any anginal symptoms. Continue ASA. See below for focus on risk factor modification. She is noted to be on metoprolol in form of Lopressor only once daily - this usually should be split up into BID dosing or consolidated to Toprol. However, will hold off on making this change today pending her BP readings in case we make an adjustment to her regimen below.  2. HTN - Suboptimal blood pressure control noted today. We need a better idea of what it's running at home. The patient was provided instructions on monitoring blood pressure at home for 5-7 days and relaying results to our office. Depending on the results, consideration could be given to titrating olmesartan or changing metoprolol to carvedilol. Either way we'll need to review the beta blocker plan to consolidate to Toprol if not changing to carvedilol.   3. Hyperlipidemia - LDL suboptimally controlled at 85. We discussed strategy of lifestyle modification versus pharmacotherapy. She is already on atorvastatin 80mg  and made strides with her diet. Per our discussion, we will add Zetia 10mg  daily along with lifestyle modifications with recheck LFTs/lipid panel  (fasting) in 2-3 months.  4. PACs - noted on EKG, asymptomatic today. Recent labs as noted above. Discussed observation for symptoms.  5. Tobacco cessation - discussed importance of cessation.  Disposition: F/u with Dr. Harl Bowie in 1 year.  Medication Adjustments/Labs and Tests Ordered: Current medicines are reviewed at length with the patient today.  Concerns regarding medicines are outlined above. Medication changes, Labs and Tests ordered today are summarized above and listed in the Patient Instructions accessible in Encounters.    Signed, Charlie Pitter, PA-C  02/07/2021 2:31 PM    [place] Location in [place], [place] [address] Ph: [phone]; Fax [phone]

## 2021-02-07 ENCOUNTER — Ambulatory Visit: Payer: PPO | Admitting: Physician Assistant

## 2021-02-07 ENCOUNTER — Encounter: Payer: Self-pay | Admitting: Physician Assistant

## 2021-02-07 ENCOUNTER — Other Ambulatory Visit: Payer: Self-pay

## 2021-02-07 VITALS — BP 140/60 | HR 90 | Ht 63.0 in | Wt 151.0 lb

## 2021-02-07 DIAGNOSIS — I2510 Atherosclerotic heart disease of native coronary artery without angina pectoris: Secondary | ICD-10-CM

## 2021-02-07 DIAGNOSIS — I491 Atrial premature depolarization: Secondary | ICD-10-CM

## 2021-02-07 DIAGNOSIS — I10 Essential (primary) hypertension: Secondary | ICD-10-CM | POA: Diagnosis not present

## 2021-02-07 DIAGNOSIS — E785 Hyperlipidemia, unspecified: Secondary | ICD-10-CM | POA: Diagnosis not present

## 2021-02-07 MED ORDER — EZETIMIBE 10 MG PO TABS: 10.0000 mg | ORAL_TABLET | Freq: Every day | ORAL | 3 refills | Status: DC

## 2021-02-07 NOTE — Patient Instructions (Addendum)
Medication Instructions:  START Zetia 10 mg tablets daily *If you need a refill on your cardiac medications before your next appointment, please call your pharmacy*   Lab Work: In 2-3 Months:  FASTING LIPIDS LFT If you have labs (blood work) drawn today and your tests are completely normal, you will receive your results only by: Marland Kitchen MyChart Message (if you have MyChart) OR . A paper copy in the mail If you have any lab test that is abnormal or we need to change your treatment, we will call you to review the results.   Testing/Procedures: None   Follow-Up: At [facility], you and your health needs are our priority.  As part of our continuing mission to provide you with exceptional heart care, we have created designated Provider Care Teams.  These Care Teams include your primary Cardiologist (physician) and Advanced Practice Providers (APPs -  Physician Assistants and Nurse Practitioners) who all work together to provide you with the care you need, when you need it.  We recommend signing up for the patient portal called "MyChart".  Sign up information is provided on this After Visit Summary.  MyChart is used to connect with patients for Virtual Visits (Telemedicine).  Patients are able to view lab/test results, encounter notes, upcoming appointments, etc.  Non-urgent messages can be sent to your provider as well.   To learn more about what you can do with MyChart, go to [email or web address].    Your next appointment:   12 month(s)  The format for your next appointment:   In Person  Provider:   Carlyle Dolly, MD   Other Instructions  I would recommend using a blood pressure cuff that goes on your arm. The wrist ones can be inaccurate. If possible, try to select one that also reports your heart rate. To check your blood pressure, choose a time at least 3 hours after taking your blood pressure medicines. If you can sample it at different times of the day, that's great - it might  give you more information about how your blood pressure fluctuates. Remain seated in a chair for 5 minutes quietly beforehand, then check it. Please record a list of those readings and call us/send in MyChart message with them for our review in 5-7 days.

## 2021-02-19 ENCOUNTER — Other Ambulatory Visit (HOSPITAL_COMMUNITY): Payer: PPO

## 2021-03-07 DIAGNOSIS — Z23 Encounter for immunization: Secondary | ICD-10-CM | POA: Diagnosis not present

## 2021-03-17 ENCOUNTER — Ambulatory Visit: Payer: PPO | Admitting: Gastroenterology

## 2021-03-17 ENCOUNTER — Other Ambulatory Visit: Payer: Self-pay

## 2021-03-17 ENCOUNTER — Encounter: Payer: Self-pay | Admitting: Gastroenterology

## 2021-03-17 ENCOUNTER — Encounter: Payer: Self-pay | Admitting: *Deleted

## 2021-03-17 DIAGNOSIS — Z86010 Personal history of colonic polyps: Secondary | ICD-10-CM | POA: Insufficient documentation

## 2021-03-17 DIAGNOSIS — Z9889 Other specified postprocedural states: Secondary | ICD-10-CM | POA: Diagnosis not present

## 2021-03-17 MED ORDER — PEG 3350-KCL-NA BICARB-NACL 420 G PO SOLR: ORAL | 0 refills | Status: DC

## 2021-03-17 NOTE — Progress Notes (Signed)
Primary Care Physician:  Ailene Ards, NP  Primary Gastroenterologist:  Elon Alas. Abbey Chatters, DO   Chief Complaint  Patient presents with  . Colonoscopy    Last tcs age 65--1 polyp. Doing ok    HPI:  Leah [name] is a 65 y.o. female here at the request of Jeralyn Ruths, NP to schedule colonoscopy.   Patient had her last colonoscopy in 2006-03-13.  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  Patient states she has a history of IBS but overall has done fairly well.  She has a bowel movement several times per week, if she goes more than 1 to 2 days without a stool, she will take Senokot with good results.  Denies any melena or rectal bleeding.  No abdominal pain.  No nausea or vomiting.  No heartburn.  Lost some weight over the years but has been stable in the past one year.  At her highest she weighed 250 pounds.  Lost a lot of weight around the death of her husband in 03/13/12.  She states that she had no vehicle and was forced to walk everywhere she went.  Over the past 2 years she has typically been in the 155-165 pound range.  Current Outpatient Medications  Medication Sig Dispense Refill  . amLODipine (NORVASC) 10 MG tablet TAKE ONE TABLET BY MOUTH ONCE DAILY. 90 tablet 0  . aspirin EC 81 MG tablet Take 81 mg by mouth every morning.     Marland Kitchen atorvastatin (LIPITOR) 80 MG tablet TAKE (1) TABLET BY MOUTH EACH MORNING. 90 tablet 0  . Blood Pressure Monitoring (BLOOD PRESSURE MONITOR AUTOMAT) DEVI 1 Device by Does not apply route daily. Check blood pressure daily 1 each 0  . Ergocalciferol (VITAMIN D2) 50 MCG (2000 UT) TABS Take 1 tablet by mouth daily.    Marland Kitchen ezetimibe (ZETIA) 10 MG tablet Take 1 tablet (10 mg total) by mouth daily. 90 tablet 3  . hydrochlorothiazide (HYDRODIURIL) 12.5 MG tablet Take 1 tablet (12.5  mg total) by mouth daily. 90 tablet 0  . metoprolol tartrate (LOPRESSOR) 50 MG tablet TAKE ONE TABLET BY MOUTH ONCE DAILY WITH OR IMMEDIATELY FOLLOWING A MEAL. 90 tablet 1  . Multiple Vitamin (MULTIVITAMIN WITH MINERALS) TABS tablet Take 1 tablet by mouth every morning.    . olmesartan (BENICAR) 20 MG tablet TAKE (1) TABLET BY MOUTH ONCE DAILY. 90 tablet 0  . Omega 3 1000 MG CAPS Take 1 capsule by mouth daily.    . vitamin B-12 (CYANOCOBALAMIN) 1000 MCG tablet Take 500 mcg by mouth daily.    . vitamin E 400 UNIT capsule Take 400 Units by mouth daily.     No current facility-administered medications for this visit.    Allergies as of 03/17/2021 - Review Complete 03/17/2021  Allergen Reaction Noted  . Codeine  12/13/2006    Past Medical History:  Diagnosis Date  . Arthritis   . Borderline diabetes   . Breast lump on right side at 3 o'clock position    Benign, fatty mass  . History of kidney stones   . History of shingles   . Hyperlipidemia   . Hypertension   . Myocardial infarction ([place]) 1996    HAD ANGIOPLASTY WITH 2 STENTS  . Tinnitus    right ear   . Vitamin D deficiency 10/17/2019  .  Wears glasses     Past Surgical History:  Procedure Laterality Date  . BREAST LUMPECTOMY  >10 YRS AGO   RT BREAST  . CORONARY ANGIOPLASTY  1996   ANGIOPLASTY / 2 STENTS  . CYSTOSCOPY WITH STENT PLACEMENT Right 12/30/2015   Procedure: CYSTOSCOPY;  Surgeon: Cleon Gustin, MD;  [facility];  Service: Urology;  Laterality: Right;  . CYSTOSCOPY WITH STENT PLACEMENT Right 01/09/2016   Procedure: CYSTOSCOPY WITH STENT PLACEMENT;  Surgeon: Cleon Gustin, MD;  [facility];  Service: Urology;  Laterality: Right;  . HOLMIUM LASER APPLICATION Right 0/62/6948   Procedure: HOLMIUM LASER APPLICATION;  Surgeon: Cleon Gustin, MD;  [facility];  Service: Urology;  Laterality: Right;  . LITHOTRIPSY    . NEPHROLITHOTOMY Right 12/30/2015   Procedure: RIght Nephrostomy with access;   Surgeon: Cleon Gustin, MD;  [facility];  Service: Urology;  Laterality: Right;  . NEPHROLITHOTOMY Right 01/09/2016   Procedure: RIGHT PERCUTANEOUS NEPHROLITHOTOMY SECOND LOOK , POSSIBLE DILATION OF TRACT;  Surgeon: Cleon Gustin, MD;  [facility];  Service: Urology;  Laterality: Right;    Family History  Problem Relation Age of Onset  . Heart disease Mother   . Diabetes Mother   . Cancer Father        bone  . Cirrhosis Father   . Stroke Maternal Grandmother   . Stroke Maternal Grandfather   . Heart disease Sister   . Obesity Brother   . Healthy Daughter     Social History   Socioeconomic History  . Marital status: Widowed    Spouse name: Not on file  . Number of children: 1  . Years of education: Not on file  . Highest education level: Not on file  Occupational History  . Occupation: Retired  Tobacco Use  . Smoking status: Current Every Day Smoker    Packs/day: 1.00    Years: 40.00    Pack years: 40.00    Types: Cigarettes  . Smokeless tobacco: Never Used  Vaping Use  . Vaping Use: Never used  Substance and Sexual Activity  . Alcohol use: No    Alcohol/week: 0.0 standard drinks  . Drug use: No  . Sexual activity: Not Currently  Other Topics Concern  . 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   Social Determinants of Health   Financial Resource Strain: Not on file  Food Insecurity: Not on file  Transportation Needs: Not on file  Physical Activity: Not on file  Stress: Not on file  Social Connections: Not on file  Intimate Partner Violence: Not on file      ROS:  General: Negative for anorexia, weight loss, fever, chills, fatigue, weakness. Eyes: Negative for vision changes.  ENT: Negative for hoarseness, difficulty swallowing , nasal congestion. CV: Negative for chest pain, angina, palpitations, dyspnea on exertion, peripheral edema.  Respiratory: Negative for dyspnea at rest, dyspnea on  exertion, cough, sputum, wheezing.  GI: See history of present illness. GU:  Negative for dysuria, hematuria, urinary incontinence, urinary frequency, nocturnal urination.  MS: Negative for joint pain, low back pain.  Derm: Negative for rash or itching.  Neuro: Negative for weakness, abnormal sensation, seizure, frequent headaches, memory loss, confusion.  Psych: Negative for anxiety, depression, suicidal ideation, hallucinations.  Endo: Negative for unusual weight change.  Heme: Negative for bruising or bleeding. Allergy: Negative for rash or hives.    Physical Examination:  BP 140/69   Pulse 69  Temp (!) 96.9 F (36.1 C) (Temporal)   Ht 5\' 3"  (1.6 m)   Wt 157 lb 12.8 oz (71.6 kg)   LMP 05/16/2002   BMI 27.95 kg/m    General: Well-nourished, well-developed in no acute distress.  Head: Normocephalic, atraumatic.   Eyes: Conjunctiva pink, no icterus. Mouth: masked Neck: Supple without thyromegaly, masses, or lymphadenopathy.  Lungs: Clear to auscultation bilaterally.  Heart: Regular rate and rhythm, no murmurs rubs or gallops.  Abdomen: Bowel sounds are normal, nontender, nondistended, no hepatosplenomegaly or masses, no abdominal bruits or    hernia , no rebound or guarding.   Rectal: not performed Extremities: No lower extremity edema. No clubbing or deformities.  Neuro: Alert and oriented x 4 , grossly normal neurologically.  Skin: Warm and dry, no rash or jaundice.   Psych: Alert and cooperative, normal mood and affect.  Labs: Lab Results  Component Value Date   CREATININE 0.92 12/11/2020   BUN 9 12/11/2020   NA 139 12/11/2020   K 4.0 12/11/2020   CL 102 12/11/2020   CO2 29 12/11/2020   Lab Results  Component Value Date   WBC 5.6 12/11/2020   HGB 15.7 (H) 12/11/2020   HCT 45.9 (H) 12/11/2020   MCV 89.8 12/11/2020   PLT 244 12/11/2020   Lab Results  Component Value Date   ALT 9 12/11/2020   AST 16 12/11/2020   ALKPHOS 130 (H) 12/12/2019   BILITOT 0.4  12/11/2020   Lab Results  Component Value Date   TSH 2.13 12/11/2020     Imaging Studies: No results found.  Assessment/plan:  Pleasant 65 year old female presenting to schedule colonoscopy.  She has history of inflamed/hyperplastic colon polyps in the past, 2007.  Denies any GI symptoms.  She has chronic mild constipation well managed with Senokot. Desires updating colonoscopy at this time.  Plan for colonoscopy with Dr. Abbey Chatters. ASA III.  I have discussed the risks, alternatives, benefits with regards to but not limited to the risk of reaction to medication, bleeding, infection, perforation and the patient is agreeable to proceed. Written consent to be obtained.

## 2021-03-17 NOTE — Patient Instructions (Signed)
1. Colonoscopy as scheduled. Please see separate instructions. 

## 2021-03-25 ENCOUNTER — Other Ambulatory Visit (INDEPENDENT_AMBULATORY_CARE_PROVIDER_SITE_OTHER): Payer: Self-pay | Admitting: Internal Medicine

## 2021-04-10 ENCOUNTER — Ambulatory Visit (INDEPENDENT_AMBULATORY_CARE_PROVIDER_SITE_OTHER): Payer: PPO | Admitting: Nurse Practitioner

## 2021-04-16 ENCOUNTER — Encounter (INDEPENDENT_AMBULATORY_CARE_PROVIDER_SITE_OTHER): Payer: Self-pay | Admitting: Nurse Practitioner

## 2021-04-16 ENCOUNTER — Ambulatory Visit (INDEPENDENT_AMBULATORY_CARE_PROVIDER_SITE_OTHER): Payer: PPO | Admitting: Nurse Practitioner

## 2021-04-16 ENCOUNTER — Other Ambulatory Visit: Payer: Self-pay

## 2021-04-16 VITALS — BP 118/68 | HR 67 | Temp 96.2°F | Ht 63.0 in | Wt 160.8 lb

## 2021-04-16 DIAGNOSIS — I10 Essential (primary) hypertension: Secondary | ICD-10-CM

## 2021-04-16 DIAGNOSIS — I2510 Atherosclerotic heart disease of native coronary artery without angina pectoris: Secondary | ICD-10-CM

## 2021-04-16 DIAGNOSIS — E119 Type 2 diabetes mellitus without complications: Secondary | ICD-10-CM | POA: Diagnosis not present

## 2021-04-16 MED ORDER — METOPROLOL TARTRATE 50 MG PO TABS: 50.0000 mg | ORAL_TABLET | Freq: Every day | ORAL | 1 refills | Status: DC

## 2021-04-16 MED ORDER — ATORVASTATIN CALCIUM 80 MG PO TABS: 80.0000 mg | ORAL_TABLET | Freq: Every day | ORAL | 1 refills | Status: DC

## 2021-04-16 MED ORDER — OLMESARTAN MEDOXOMIL 20 MG PO TABS: 20.0000 mg | ORAL_TABLET | Freq: Every day | ORAL | 1 refills | Status: DC

## 2021-04-16 MED ORDER — HYDROCHLOROTHIAZIDE 12.5 MG PO TABS: 12.5000 mg | ORAL_TABLET | Freq: Every day | ORAL | 1 refills | Status: DC

## 2021-04-16 MED ORDER — AMLODIPINE BESYLATE 10 MG PO TABS: 10.0000 mg | ORAL_TABLET | Freq: Every day | ORAL | 1 refills | Status: DC

## 2021-04-16 NOTE — Progress Notes (Signed)
Subjective:  Patient ID: Leah [name], female    DOB: 11-15-1956  Age: 65 y.o. MRN: [id number]  CC:  Chief Complaint  Patient presents with  . Follow-up    Doing well, having a colonoscopy next week  . Coronary Artery Disease  . Diabetes  . Hypertension      HPI  This patient arrives today for the above.  CAD: She reports no chest pain.  She continues on aspirin, atorvastatin, Zetia, and omega-3.  Last LDL was 85.  She is being evaluated and followed by cardiology.  Diabetes: She has a history of type 2 diabetes.  She is on ARB and statin therapy.  She is not on any antihyperglycemic agent at this time.  Last A1c was 5.6.  Hypertension: She continues on amlodipine, Toprol, hydrochlorothiazide, and olmesartan she tolerates medications well.  Past Medical History:  Diagnosis Date  . Arthritis   . Borderline diabetes   . Breast lump on right side at 3 o'clock position    Benign, fatty mass  . History of kidney stones   . History of shingles   . Hyperlipidemia   . Hypertension   . Myocardial infarction ([place]) 1996    HAD ANGIOPLASTY WITH 2 STENTS  . Tinnitus    right ear   . Vitamin D deficiency 10/17/2019  . Wears glasses       Family History  Problem Relation Age of Onset  . Heart disease Mother   . Diabetes Mother   . Cancer Father        bone  . Cirrhosis Father   . Stroke Maternal Grandmother   . Stroke Maternal Grandfather   . Heart disease Sister   . Obesity Brother   . Healthy Daughter     Social History   Social History Narrative   Single,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   Social History   Tobacco Use  . Smoking status: Current Every Day Smoker    Packs/day: 1.00    Years: 40.00    Pack years: 40.00    Types: Cigarettes  . Smokeless tobacco: Never Used  Substance Use Topics  . Alcohol use: No    Alcohol/week: 0.0 standard drinks     Current Meds  Medication Sig  . aspirin EC 81 MG tablet Take 81 mg by mouth every morning.    . Blood Pressure Monitoring (BLOOD PRESSURE MONITOR AUTOMAT) DEVI 1 Device by Does not apply route daily. Check blood pressure daily  . Ergocalciferol (VITAMIN D2) 50 MCG (2000 UT) TABS Take 2,000 Units by mouth daily.  Marland Kitchen ezetimibe (ZETIA) 10 MG tablet Take 1 tablet (10 mg total) by mouth daily.  Marland Kitchen ibuprofen (ADVIL) 200 MG tablet Take 400 mg by mouth every 6 (six) hours as needed for headache or moderate pain.  . Multiple Vitamin (MULTIVITAMIN WITH MINERALS) TABS tablet Take 1 tablet by mouth every morning.  . Omega 3 1000 MG CAPS Take 1,000 mg by mouth daily.  . polyethylene glycol-electrolytes (NULYTELY) 420 g solution As directed  . senna (SENOKOT) 8.6 MG TABS tablet Take 8.6 mg by mouth daily as needed for mild constipation.  . vitamin B-12 (CYANOCOBALAMIN) 500 MCG tablet Take 500 mcg by mouth daily.  . vitamin E 400 UNIT capsule Take 400 Units by mouth daily.  . [DISCONTINUED] amLODipine (NORVASC) 10 MG tablet TAKE ONE TABLET BY MOUTH ONCE DAILY. (Patient taking differently: Take 10 mg by mouth daily.)  . [DISCONTINUED] atorvastatin (LIPITOR) 80 MG tablet TAKE (1) TABLET BY  MOUTH EACH MORNING. (Patient taking differently: Take 80 mg by mouth daily.)  . [DISCONTINUED] hydrochlorothiazide (HYDRODIURIL) 12.5 MG tablet Take 1 tablet (12.5 mg total) by mouth daily.  . [DISCONTINUED] metoprolol tartrate (LOPRESSOR) 50 MG tablet TAKE ONE TABLET BY MOUTH ONCE DAILY WITH OR IMMEDIATELY FOLLOWING A MEAL. (Patient taking differently: Take 50 mg by mouth daily.)  . [DISCONTINUED] olmesartan (BENICAR) 20 MG tablet TAKE (1) TABLET BY MOUTH ONCE DAILY. (Patient taking differently: Take 20 mg by mouth daily.)    ROS:  Review of Systems  Constitutional: Negative for malaise/fatigue.  Eyes: Negative for blurred vision.  Respiratory: Negative for shortness of breath.   Cardiovascular: Negative for chest pain.  Neurological: Negative for dizziness and headaches.     Objective:   Today's Vitals: BP  118/68   Pulse 67   Temp (!) 96.2 F (35.7 C) (Temporal)   Ht 5' 3" (1.6 m)   Wt 160 lb 12.8 oz (72.9 kg)   LMP 05/16/2002   SpO2 98%   BMI 28.48 kg/m  Vitals with BMI 04/16/2021 03/17/2021 02/07/2021  Height 5' 3" 5' 3" 5' 3"  Weight 160 lbs 13 oz 157 lbs 13 oz 151 lbs  BMI 28.49 39.03 00.92  Systolic 330 076 226  Diastolic 68 69 60  Pulse 67 69 90     Physical Exam Vitals reviewed.  Constitutional:      General: She is not in acute distress.    Appearance: Normal appearance.  HENT:     Head: Normocephalic and atraumatic.  Neck:     Vascular: No carotid bruit.  Cardiovascular:     Rate and Rhythm: Normal rate and regular rhythm.     Pulses: Normal pulses.     Heart sounds: Normal heart sounds.  Pulmonary:     Effort: Pulmonary effort is normal.     Breath sounds: Normal breath sounds.  Skin:    General: Skin is warm and dry.  Neurological:     General: No focal deficit present.     Mental Status: She is alert and oriented to person, place, and time.  Psychiatric:        Mood and Affect: Mood normal.        Behavior: Behavior normal.        Judgment: Judgment normal.          Assessment and Plan   1. Essential hypertension   2. Type 2 diabetes mellitus without complication, without long-term current use of insulin ([place])   3. Coronary artery disease involving native heart without angina pectoris, unspecified vessel or lesion type      Plan: 1.  Blood pressure well controlled on current regimen.  She will continue taking her medications as prescribed.  We will check CMP today. 2.  We will check A1c today.  She will continue on ARB and statin. 3.  She will continue to follow-up with cardiology and will continue taking her medication as prescribed.   Tests ordered Orders Placed This Encounter  Procedures  . CMP with eGFR([facility])  . Hemoglobin A1c      Meds ordered this encounter  Medications  . amLODipine (NORVASC) 10 MG tablet    Sig: Take 1 tablet  (10 mg total) by mouth daily.    Dispense:  90 tablet    Refill:  1    Order Specific Question:   Supervising Provider    Answer:   Hurshel Party C [[id number]]  . atorvastatin (LIPITOR) 80 MG tablet  Sig: Take 1 tablet (80 mg total) by mouth daily.    Dispense:  90 tablet    Refill:  1    Order Specific Question:   Supervising Provider    Answer:   Hurshel Party C [[id number]]  . olmesartan (BENICAR) 20 MG tablet    Sig: Take 1 tablet (20 mg total) by mouth daily.    Dispense:  90 tablet    Refill:  1    Order Specific Question:   Supervising Provider    Answer:   Hurshel Party C [[id number]]  . metoprolol tartrate (LOPRESSOR) 50 MG tablet    Sig: Take 1 tablet (50 mg total) by mouth daily.    Dispense:  90 tablet    Refill:  1    Order Specific Question:   Supervising Provider    Answer:   Hurshel Party C [[id number]]  . hydrochlorothiazide (HYDRODIURIL) 12.5 MG tablet    Sig: Take 1 tablet (12.5 mg total) by mouth daily.    Dispense:  90 tablet    Refill:  1    Order Specific Question:   Supervising Provider    Answer:   Doree Albee [[id number]]    Patient to follow-up in January for her annual physical exam, or sooner as needed.  Ailene Ards, NP

## 2021-04-17 LAB — HEMOGLOBIN A1C
Hgb A1c MFr Bld: 6 % of total Hgb — ABNORMAL HIGH (ref <5.7)
Mean Plasma Glucose: 126 mg/dL
eAG (mmol/L): 7 mmol/L

## 2021-04-17 LAB — COMPLETE METABOLIC PANEL WITH GFR
AG Ratio: 1.5 (calc) (ref 1.0–2.5)
ALT: 24 U/L (ref 6–29)
AST: 25 U/L (ref 10–35)
Albumin: 4.1 g/dL (ref 3.6–5.1)
Alkaline phosphatase (APISO): 141 U/L (ref 37–153)
BUN/Creatinine Ratio: 10 (calc) (ref 6–22)
BUN: 11 mg/dL (ref 7–25)
CO2: 28 mmol/L (ref 20–32)
Calcium: 9.4 mg/dL (ref 8.6–10.4)
Chloride: 101 mmol/L (ref 98–110)
Creat: 1.05 mg/dL — ABNORMAL HIGH (ref 0.50–0.99)
GFR, Est African American: 64 mL/min/{1.73_m2} (ref >=60)
GFR, Est Non African American: 55 mL/min/{1.73_m2} — ABNORMAL LOW (ref >=60)
Globulin: 2.8 g/dL (calc) (ref 1.9–3.7)
Glucose, Bld: 92 mg/dL (ref 65–139)
Potassium: 4.1 mmol/L (ref 3.5–5.3)
Sodium: 137 mmol/L (ref 135–146)
Total Bilirubin: 0.5 mg/dL (ref 0.2–1.2)
Total Protein: 6.9 g/dL (ref 6.1–8.1)

## 2021-04-17 NOTE — Patient Instructions (Signed)
Leah [name]  04/17/2021     @PREFPERIOPPHARMACY @   Your procedure is scheduled on 04/22/2021.   Report to [facility] at  [email or web address].   Call this number if you have problems the morning of surgery:  [phone]   Remember:  Follow the diet and prep instructions given to you by the office.                     Take these medicines the morning of surgery with A SIP OF WATER  Amlodipine, metoprolol.     Please brush your teeth.  Do not wear jewelry, make-up or nail polish.  Do not wear lotions, powders, or perfumes, or deodorant.  Do not shave 48 hours prior to surgery.  Men may shave face and neck.  Do not bring valuables to the hospital.  [facility] is not responsible for any belongings or valuables.  Contacts, dentures or bridgework may not be worn into surgery.  Leave your suitcase in the car.  After surgery it may be brought to your room.  For patients admitted to the hospital, discharge time will be determined by your treatment team.  Patients discharged the day of surgery will not be allowed to drive home and must have someone with them for 24 hours.    Special instructions:  DO NOT smoke tobacco or vape for 24 hours before your procedure.  Please read over the following fact sheets that you were given. Anesthesia Post-op Instructions and Care and Recovery After Surgery       Colonoscopy, Adult, Care After This sheet gives you information about how to care for yourself after your procedure. Your health care provider may also give you more specific instructions. If you have problems or questions, contact your health care provider. What can I expect after the procedure? After the procedure, it is common to have:  A small amount of blood in your stool for 24 hours after the procedure.  Some gas.  Mild cramping or bloating of your abdomen. Follow these instructions at home: Eating and drinking  Drink enough fluid to keep your urine pale  yellow.  Follow instructions from your health care provider about eating or drinking restrictions.  Resume your normal diet as instructed by your health care provider. Avoid heavy or fried foods that are hard to digest.   Activity  Rest as told by your health care provider.  Avoid sitting for a long time without moving. Get up to take short walks every 1-2 hours. This is important to improve blood flow and breathing. Ask for help if you feel weak or unsteady.  Return to your normal activities as told by your health care provider. Ask your health care provider what activities are safe for you. Managing cramping and bloating  Try walking around when you have cramps or feel bloated.  Apply heat to your abdomen as told by your health care provider. Use the heat source that your health care provider recommends, such as a moist heat pack or a heating pad. ? Place a towel between your skin and the heat source. ? Leave the heat on for 20-30 minutes. ? Remove the heat if your skin turns bright red. This is especially important if you are unable to feel pain, heat, or cold. You may have a greater risk of getting burned.   General instructions  If you were given a sedative during the procedure, it can affect you  for several hours. Do not drive or operate machinery until your health care provider says that it is safe.  For the first 24 hours after the procedure: ? Do not sign important documents. ? Do not drink alcohol. ? Do your regular daily activities at a slower pace than normal. ? Eat soft foods that are easy to digest.  Take over-the-counter and prescription medicines only as told by your health care provider.  Keep all follow-up visits as told by your health care provider. This is important. Contact a health care provider if:  You have blood in your stool 2-3 days after the procedure. Get help right away if you have:  More than a small spotting of blood in your stool.  Large blood  clots in your stool.  Swelling of your abdomen.  Nausea or vomiting.  A fever.  Increasing pain in your abdomen that is not relieved with medicine. Summary  After the procedure, it is common to have a small amount of blood in your stool. You may also have mild cramping and bloating of your abdomen.  If you were given a sedative during the procedure, it can affect you for several hours. Do not drive or operate machinery until your health care provider says that it is safe.  Get help right away if you have a lot of blood in your stool, nausea or vomiting, a fever, or increased pain in your abdomen. This information is not intended to replace advice given to you by your health care provider. Make sure you discuss any questions you have with your health care provider. Document Revised: 10/27/2019 Document Reviewed: 05/29/2019 Elsevier Patient Education  2021 [place] After This sheet gives you information about how to care for yourself after your procedure. Your health care provider may also give you more specific instructions. If you have problems or questions, contact your health care provider. What can I expect after the procedure? After the procedure, it is common to have:  Tiredness.  Forgetfulness about what happened after the procedure.  Impaired judgment for important decisions.  Nausea or vomiting.  Some difficulty with balance. Follow these instructions at home: For the time period you were told by your health care provider:  Rest as needed.  Do not participate in activities where you could fall or become injured.  Do not drive or use machinery.  Do not drink alcohol.  Do not take sleeping pills or medicines that cause drowsiness.  Do not make important decisions or sign legal documents.  Do not take care of children on your own.      Eating and drinking  Follow the diet that is recommended by your health care  provider.  Drink enough fluid to keep your urine pale yellow.  If you vomit: ? Drink water, juice, or soup when you can drink without vomiting. ? Make sure you have little or no nausea before eating solid foods. General instructions  Have a responsible adult stay with you for the time you are told. It is important to have someone help care for you until you are awake and alert.  Take over-the-counter and prescription medicines only as told by your health care provider.  If you have sleep apnea, surgery and certain medicines can increase your risk for breathing problems. Follow instructions from your health care provider about wearing your sleep device: ? Anytime you are sleeping, including during daytime naps. ? While taking prescription pain medicines, sleeping medicines, or medicines that  make you drowsy.  Avoid smoking.  Keep all follow-up visits as told by your health care provider. This is important. Contact a health care provider if:  You keep feeling nauseous or you keep vomiting.  You feel light-headed.  You are still sleepy or having trouble with balance after 24 hours.  You develop a rash.  You have a fever.  You have redness or swelling around the IV site. Get help right away if:  You have trouble breathing.  You have new-onset confusion at home. Summary  For several hours after your procedure, you may feel tired. You may also be forgetful and have poor judgment.  Have a responsible adult stay with you for the time you are told. It is important to have someone help care for you until you are awake and alert.  Rest as told. Do not drive or operate machinery. Do not drink alcohol or take sleeping pills.  Get help right away if you have trouble breathing, or if you suddenly become confused. This information is not intended to replace advice given to you by your health care provider. Make sure you discuss any questions you have with your health care  provider. Document Revised: 07/18/2020 Document Reviewed: 10/05/2019 Elsevier Patient Education  2021 [employer].

## 2021-04-18 ENCOUNTER — Other Ambulatory Visit: Payer: Self-pay

## 2021-04-18 ENCOUNTER — Encounter (HOSPITAL_COMMUNITY): Payer: Self-pay

## 2021-04-18 ENCOUNTER — Encounter (HOSPITAL_COMMUNITY)
Admission: RE | Admit: 2021-04-18 | Discharge: 2021-04-18 | Disposition: A | Discharge status: Home / Self Care | Payer: PPO | Source: Ambulatory Visit | Attending: Internal Medicine | Admitting: Internal Medicine

## 2021-04-18 ENCOUNTER — Other Ambulatory Visit (HOSPITAL_COMMUNITY): Payer: PPO | Attending: Internal Medicine

## 2021-04-22 ENCOUNTER — Ambulatory Visit (HOSPITAL_COMMUNITY): Payer: PPO | Admitting: Anesthesiology

## 2021-04-22 ENCOUNTER — Encounter (HOSPITAL_COMMUNITY)
Admission: RE | Disposition: A | Discharge status: Home / Self Care | Payer: Self-pay | Source: Home / Self Care | Attending: Internal Medicine

## 2021-04-22 ENCOUNTER — Ambulatory Visit (HOSPITAL_COMMUNITY)
Admission: RE | Admit: 2021-04-22 | Discharge: 2021-04-22 | Disposition: A | Discharge status: Home / Self Care | Payer: PPO | Attending: Internal Medicine | Admitting: Internal Medicine

## 2021-04-22 ENCOUNTER — Encounter (HOSPITAL_COMMUNITY): Payer: Self-pay

## 2021-04-22 DIAGNOSIS — Z885 Allergy status to narcotic agent status: Secondary | ICD-10-CM | POA: Insufficient documentation

## 2021-04-22 DIAGNOSIS — Z1211 Encounter for screening for malignant neoplasm of colon: Secondary | ICD-10-CM | POA: Diagnosis not present

## 2021-04-22 DIAGNOSIS — D120 Benign neoplasm of cecum: Secondary | ICD-10-CM | POA: Insufficient documentation

## 2021-04-22 DIAGNOSIS — F17210 Nicotine dependence, cigarettes, uncomplicated: Secondary | ICD-10-CM | POA: Insufficient documentation

## 2021-04-22 DIAGNOSIS — Z791 Long term (current) use of non-steroidal anti-inflammatories (NSAID): Secondary | ICD-10-CM | POA: Insufficient documentation

## 2021-04-22 DIAGNOSIS — D122 Benign neoplasm of ascending colon: Secondary | ICD-10-CM | POA: Insufficient documentation

## 2021-04-22 DIAGNOSIS — Z808 Family history of malignant neoplasm of other organs or systems: Secondary | ICD-10-CM | POA: Diagnosis not present

## 2021-04-22 DIAGNOSIS — Z8249 Family history of ischemic heart disease and other diseases of the circulatory system: Secondary | ICD-10-CM | POA: Diagnosis not present

## 2021-04-22 DIAGNOSIS — K635 Polyp of colon: Secondary | ICD-10-CM

## 2021-04-22 DIAGNOSIS — Z833 Family history of diabetes mellitus: Secondary | ICD-10-CM | POA: Diagnosis not present

## 2021-04-22 DIAGNOSIS — I10 Essential (primary) hypertension: Secondary | ICD-10-CM | POA: Insufficient documentation

## 2021-04-22 DIAGNOSIS — Z823 Family history of stroke: Secondary | ICD-10-CM | POA: Insufficient documentation

## 2021-04-22 DIAGNOSIS — Z7982 Long term (current) use of aspirin: Secondary | ICD-10-CM | POA: Diagnosis not present

## 2021-04-22 DIAGNOSIS — D123 Benign neoplasm of transverse colon: Secondary | ICD-10-CM | POA: Insufficient documentation

## 2021-04-22 DIAGNOSIS — K648 Other hemorrhoids: Secondary | ICD-10-CM | POA: Diagnosis not present

## 2021-04-22 DIAGNOSIS — Z79899 Other long term (current) drug therapy: Secondary | ICD-10-CM | POA: Insufficient documentation

## 2021-04-22 DIAGNOSIS — I2510 Atherosclerotic heart disease of native coronary artery without angina pectoris: Secondary | ICD-10-CM | POA: Diagnosis not present

## 2021-04-22 HISTORY — PX: POLYPECTOMY: SHX5525

## 2021-04-22 HISTORY — PX: COLONOSCOPY WITH PROPOFOL: SHX5780

## 2021-04-22 SURGERY — COLONOSCOPY WITH PROPOFOL
Anesthesia: General

## 2021-04-22 MED ORDER — PHENYLEPHRINE HCL (PRESSORS) 10 MG/ML IV SOLN
INTRAVENOUS | Status: DC | PRN
  Administered 2021-04-22: 80 ug via INTRAVENOUS

## 2021-04-22 MED ORDER — LACTATED RINGERS IV SOLN: INTRAVENOUS | Status: DC

## 2021-04-22 MED ORDER — PHENYLEPHRINE 40 MCG/ML (10ML) SYRINGE FOR IV PUSH (FOR BLOOD PRESSURE SUPPORT)
PREFILLED_SYRINGE | INTRAVENOUS | Status: AC
  Filled 2021-04-22: qty 10

## 2021-04-22 MED ORDER — STERILE WATER FOR IRRIGATION IR SOLN
Status: DC | PRN
  Administered 2021-04-22: 1.5 mL

## 2021-04-22 MED ORDER — PROPOFOL 500 MG/50ML IV EMUL
INTRAVENOUS | Status: DC | PRN
  Administered 2021-04-22: 100 ug/kg/min via INTRAVENOUS
  Administered 2021-04-22: 80 mg via INTRAVENOUS

## 2021-04-22 NOTE — Transfer of Care (Signed)
Immediate Anesthesia Transfer of Care Note  Patient: Leah [name]  Procedure(s) Performed: COLONOSCOPY WITH PROPOFOL (N/A ) POLYPECTOMY  Patient Location: Short Stay  Anesthesia Type:MAC  Level of Consciousness: awake, alert  and oriented  Airway & Oxygen Therapy: Patient Spontanous Breathing  Post-op Assessment: Report given to RN and Post -op Vital signs reviewed and stable  Post vital signs: Reviewed and stable  Last Vitals:  Vitals Value Taken Time  BP    Temp    Pulse    Resp    SpO2      Last Pain:  Vitals:   04/22/21 1124  PainSc: 0-No pain         Complications: No complications documented.

## 2021-04-22 NOTE — Anesthesia Postprocedure Evaluation (Signed)
Anesthesia Post Note  Patient: Leah [name]  Procedure(s) Performed: COLONOSCOPY WITH PROPOFOL (N/A ) POLYPECTOMY  Patient location during evaluation: Phase II Anesthesia Type: General Level of consciousness: awake and alert and oriented Pain management: pain level controlled Vital Signs Assessment: post-procedure vital signs reviewed and stable Respiratory status: spontaneous breathing and respiratory function stable Cardiovascular status: blood pressure returned to baseline and stable Postop Assessment: no apparent nausea or vomiting Anesthetic complications: no   No complications documented.   Last Vitals:  Vitals:   04/22/21 1155 04/22/21 1159  BP:  100/78  Pulse: 60   Resp: 16   Temp: 36.6 C   SpO2: 97%     Last Pain:  Vitals:   04/22/21 1155  TempSrc: Oral  PainSc: 0-No pain                 Liany Mumpower C Cristine Daw

## 2021-04-22 NOTE — Discharge Instructions (Addendum)
Monitored Anesthesia Care, Care After This sheet gives you information about how to care for yourself after your procedure. Your health care provider may also give you more specific instructions. If you have problems or questions, contact your health care provider. What can I expect after the procedure? After the procedure, it is common to have:  Tiredness.  Forgetfulness about what happened after the procedure.  Impaired judgment for important decisions.  Nausea or vomiting.  Some difficulty with balance. Follow these instructions at home: For the time period you were told by your health care provider:  Rest as needed.  Do not participate in activities where you could fall or become injured.  Do not drive or use machinery.  Do not drink alcohol.  Do not take sleeping pills or medicines that cause drowsiness.  Do not make important decisions or sign legal documents.  Do not take care of children on your own.      Eating and drinking  Follow the diet that is recommended by your health care provider.  Drink enough fluid to keep your urine pale yellow.  If you vomit: ? Drink water, juice, or soup when you can drink without vomiting. ? Make sure you have little or no nausea before eating solid foods. General instructions  Have a responsible adult stay with you for the time you are told. It is important to have someone help care for you until you are awake and alert.  Take over-the-counter and prescription medicines only as told by your health care provider.  If you have sleep apnea, surgery and certain medicines can increase your risk for breathing problems. Follow instructions from your health care provider about wearing your sleep device: ? Anytime you are sleeping, including during daytime naps. ? While taking prescription pain medicines, sleeping medicines, or medicines that make you drowsy.  Avoid smoking.  Keep all follow-up visits as told by your health care  provider. This is important. Contact a health care provider if:  You keep feeling nauseous or you keep vomiting.  You feel light-headed.  You are still sleepy or having trouble with balance after 24 hours.  You develop a rash.  You have a fever.  You have redness or swelling around the IV site. Get help right away if:  You have trouble breathing.  You have new-onset confusion at home. Summary  For several hours after your procedure, you may feel tired. You may also be forgetful and have poor judgment.  Have a responsible adult stay with you for the time you are told. It is important to have someone help care for you until you are awake and alert.  Rest as told. Do not drive or operate machinery. Do not drink alcohol or take sleeping pills.  Get help right away if you have trouble breathing, or if you suddenly become confused. This information is not intended to replace advice given to you by your health care provider. Make sure you discuss any questions you have with your health care provider. Document Revised: 07/18/2020 Document Reviewed: 10/05/2019 Elsevier Patient Education  2021 [place].  Colonoscopy Discharge Instructions  Read the instructions outlined below and refer to this sheet in the next few weeks. These discharge instructions provide you with general information on caring for yourself after you leave the hospital. Your doctor may also give you specific instructions. While your treatment has been planned according to the most current medical practices available, unavoidable complications occasionally occur.   ACTIVITY  You may resume  your regular activity, but move at a slower pace for the next 24 hours.   Take frequent rest periods for the next 24 hours.   Walking will help get rid of the air and reduce the bloated feeling in your belly (abdomen).   No driving for 24 hours (because of the medicine (anesthesia) used during the test).    Do not sign  any important legal documents or operate any machinery for 24 hours (because of the anesthesia used during the test).  NUTRITION  Drink plenty of fluids.   You may resume your normal diet as instructed by your doctor.   Begin with a light meal and progress to your normal diet. Heavy or fried foods are harder to digest and may make you feel sick to your stomach (nauseated).   Avoid alcoholic beverages for 24 hours or as instructed.  MEDICATIONS  You may resume your normal medications unless your doctor tells you otherwise.  WHAT YOU CAN EXPECT TODAY  Some feelings of bloating in the abdomen.   Passage of more gas than usual.   Spotting of blood in your stool or on the toilet paper.  IF YOU HAD POLYPS REMOVED DURING THE COLONOSCOPY:  No aspirin products for 7 days or as instructed.   No alcohol for 7 days or as instructed.   Eat a soft diet for the next 24 hours.  FINDING OUT THE RESULTS OF YOUR TEST Not all test results are available during your visit. If your test results are not back during the visit, make an appointment with your caregiver to find out the results. Do not assume everything is normal if you have not heard from your caregiver or the medical facility. It is important for you to follow up on all of your test results.  SEEK IMMEDIATE MEDICAL ATTENTION IF:  You have more than a spotting of blood in your stool.   Your belly is swollen (abdominal distention).   You are nauseated or vomiting.   You have a temperature over 101.   You have abdominal pain or discomfort that is severe or gets worse throughout the day.   Your colonoscopy revealed 3 polyp(s) which I removed successfully, 2 of which were large. Await pathology results, my office will contact you. I recommend repeating colonoscopy in 3 years for surveillance purposes. You also have diverticulosis and internal hemorrhoids. I would recommend increasing fiber in your diet or adding OTC Benefiber/Metamucil. Be  sure to drink at least 4 to 6 glasses of water daily. Follow-up with GI as needed.    I hope you have a great rest of your week!  Elon Alas. Abbey Chatters, D.O. Gastroenterology and Hepatology [facility]

## 2021-04-22 NOTE — Op Note (Signed)
[facility] Patient Name: Daphanie Oquendo Procedure Date: 04/22/2021 11:15 AM MRN: [id number] Date of Birth: 05/10/1956 Attending MD: Elon Alas. Abbey Chatters DO CSN: [id number] Age: 65 Admit Type: Outpatient Procedure:                Colonoscopy Indications:              Screening for colorectal malignant neoplasm Providers:                Elon Alas. Abbey Chatters, DO, Charlsie Quest. Theda Sers RN, RN,                            Randa Spike, Technician Referring MD:              Medicines:                See the Anesthesia note for documentation of the                            administered medications Complications:            No immediate complications. Estimated Blood Loss:     Estimated blood loss was minimal. Procedure:                Pre-Anesthesia Assessment:                           - The anesthesia plan was to use monitored                            anesthesia care (MAC).                           After obtaining informed consent, the colonoscope                            was passed under direct vision. Throughout the                            procedure, the patient's blood pressure, pulse, and                            oxygen saturations were monitored continuously. The                            PCF-HQ190L ([id number]) scope was introduced through                            the anus and advanced to the the cecum, identified                            by appendiceal orifice and ileocecal valve. The                            colonoscopy was performed without difficulty. The                            patient tolerated the procedure  well. The quality                            of the bowel preparation was evaluated using the                            BBPS [facility] Bowel Preparation Scale) with scores                            of: Right Colon = 2 (minor amount of residual                            staining, small fragments of stool and/or opaque                            liquid, but  mucosa seen well), Transverse Colon = 3                            (entire mucosa seen well with no residual staining,                            small fragments of stool or opaque liquid) and Left                            Colon = 3 (entire mucosa seen well with no residual                            staining, small fragments of stool or opaque                            liquid). The total BBPS score equals 8. The quality                            of the bowel preparation was good. Scope In: 11:26:47 AM Scope Out: 11:50:40 AM Scope Withdrawal Time: 0 hours 18 minutes 58 seconds  Total Procedure Duration: 0 hours 23 minutes 53 seconds  Findings:      The perianal and digital rectal examinations were normal.      Non-bleeding internal hemorrhoids were found during endoscopy.      A 20 mm polyp was found in the cecum. The polyp was carpet-like and       semi-sessile. Preparations were made for mucosal resection. Eleview was       injected with adequate lift of the lesion from the muscularis propria.       Margins were well demarcated. Snare mucosal resection with Jabier Mutton net       retrieval was performed. Resection and retrieval were complete. There       was no bleeding at the end of the procedure. To close a defect after       mucosal resection, two hemostatic clips were successfully placed (MR       conditional). There was no bleeding at the end of the procedure.      A 12 mm polyp was found in the ascending colon. The polyp was  carpet-like and semi-sessile. Preparations were made for mucosal       resection. Eleview was injected with adequate lift of the lesion from       the muscularis propria. Margins were well demarcated. Snare mucosal       resection with Jabier Mutton net retrieval was performed. A 14 mm area was       resected. Resection and retrieval were complete. There was no bleeding       at the end of the procedure. To close a defect after polypectomy, one       hemostatic clip  was successfully placed (MR conditional). There was no       bleeding at the end of the procedure.      A 5 mm polyp was found in the transverse colon. The polyp was sessile.       The polyp was removed with a cold snare. Resection and retrieval were       complete. Impression:               - Non-bleeding internal hemorrhoids.                           - One 20 mm polyp in the cecum, removed with                            mucosal resection. Resected and retrieved. Clips                            (MR conditional) were placed.                           - One 12 mm polyp in the ascending colon, removed                            with mucosal resection. Resected and retrieved.                            Clip (MR conditional) was placed.                           - One 5 mm polyp in the transverse colon, removed                            with a cold snare. Resected and retrieved.                           - Mucosal resection was performed. Resection and                            retrieval were complete.                           - Mucosal resection was performed. Resection and                            retrieval were complete. Moderate Sedation:      Per Anesthesia Care Recommendation:           -  Patient has a contact number available for                            emergencies. The signs and symptoms of potential                            delayed complications were discussed with the                            patient. Return to normal activities tomorrow.                            Written discharge instructions were provided to the                            patient.                           - Resume previous diet.                           - Continue present medications.                           - Await pathology results.                           - Repeat colonoscopy in 1-3 years for surveillance                            depending on pathology                           -  Return to GI clinic PRN. Procedure Code(s):        --- Professional ---                           [phone], Colonoscopy, flexible; with endoscopic                            mucosal resection                           [id number], 69, Colonoscopy, flexible; with removal of                            tumor(s), polyp(s), or other lesion(s) by snare                            technique Diagnosis Code(s):        --- Professional ---                           Z12.11, Encounter for screening for malignant                            neoplasm of colon  K63.5, Polyp of colon                           K64.8, Other hemorrhoids CPT copyright 2019 [employer]. All rights reserved. The codes documented in this report are preliminary and upon coder review may  be revised to meet current compliance requirements. Elon Alas. Abbey Chatters, DO [place] Abbey Chatters, DO 04/22/2021 11:59:57 AM This report has been signed electronically. Number of Addenda: 0

## 2021-04-22 NOTE — H&P (Signed)
Primary Care Physician:  Ailene Ards, NP Primary Gastroenterologist:  Dr. Abbey Chatters  Pre-Procedure History & Physical: HPI:  Leah [name] is a 65 y.o. female is here for a colonoscopy for colon cancer screening purposes.  Patient denies any family history of colorectal cancer.  No melena or hematochezia.  No abdominal pain or unintentional weight loss.  No change in bowel habits.  Notes chronic constipation Past Medical History:  Diagnosis Date  . Arthritis   . Borderline diabetes   . Breast lump on right side at 3 o'clock position    Benign, fatty mass  . History of kidney stones   . History of shingles   . Hyperlipidemia   . Hypertension   . Myocardial infarction ([place]) 1996    HAD ANGIOPLASTY WITH 2 STENTS  . Tinnitus    right ear   . Vitamin D deficiency 10/17/2019  . Wears glasses     Past Surgical History:  Procedure Laterality Date  . BREAST LUMPECTOMY  >10 YRS AGO   RT BREAST  . CORONARY ANGIOPLASTY  1996   ANGIOPLASTY / 2 STENTS  . CYSTOSCOPY WITH STENT PLACEMENT Right 12/30/2015   Procedure: CYSTOSCOPY;  Surgeon: Cleon Gustin, MD;  Location: [facility];  Service: Urology;  Laterality: Right;  . CYSTOSCOPY WITH STENT PLACEMENT Right 01/09/2016   Procedure: CYSTOSCOPY WITH STENT PLACEMENT;  Surgeon: Cleon Gustin, MD;  Location: [facility];  Service: Urology;  Laterality: Right;  . HOLMIUM LASER APPLICATION Right 2/99/2426   Procedure: HOLMIUM LASER APPLICATION;  Surgeon: Cleon Gustin, MD;  Location: [facility];  Service: Urology;  Laterality: Right;  . LITHOTRIPSY    . NEPHROLITHOTOMY Right 12/30/2015   Procedure: RIght Nephrostomy with access;  Surgeon: Cleon Gustin, MD;  Location: [facility];  Service: Urology;  Laterality: Right;  . NEPHROLITHOTOMY Right 01/09/2016   Procedure: RIGHT PERCUTANEOUS NEPHROLITHOTOMY SECOND LOOK , POSSIBLE DILATION OF TRACT;  Surgeon: Cleon Gustin, MD;  Location: [facility];  Service: Urology;  Laterality: Right;    Prior to  Admission medications   Medication Sig Start Date End Date Taking? Authorizing Provider  amLODipine (NORVASC) 10 MG tablet Take 1 tablet (10 mg total) by mouth daily. 04/16/21  Yes Ailene Ards, NP  aspirin EC 81 MG tablet Take 81 mg by mouth every morning.    Yes [provider]  atorvastatin (LIPITOR) 80 MG tablet Take 1 tablet (80 mg total) by mouth daily. 04/16/21  Yes Ailene Ards, NP  Blood Pressure Monitoring (BLOOD PRESSURE MONITOR AUTOMAT) DEVI 1 Device by Does not apply route daily. Check blood pressure daily 10/17/19  Yes Ailene Ards, NP  Ergocalciferol (VITAMIN D2) 50 MCG (2000 UT) TABS Take 2,000 Units by mouth daily.   Yes [provider]  ezetimibe (ZETIA) 10 MG tablet Take 1 tablet (10 mg total) by mouth daily. 02/07/21 05/08/21 Yes Dunn, Dayna N, PA-C  hydrochlorothiazide (HYDRODIURIL) 12.5 MG tablet Take 1 tablet (12.5 mg total) by mouth daily. 04/16/21  Yes Ailene Ards, NP  ibuprofen (ADVIL) 200 MG tablet Take 400 mg by mouth every 6 (six) hours as needed for headache or moderate pain.   Yes [provider]  metoprolol tartrate (LOPRESSOR) 50 MG tablet Take 1 tablet (50 mg total) by mouth daily. 04/16/21  Yes Ailene Ards, NP  Multiple Vitamin (MULTIVITAMIN WITH MINERALS) TABS tablet Take 1 tablet by mouth every morning.   Yes [provider]  olmesartan (BENICAR) 20 MG tablet  Take 1 tablet (20 mg total) by mouth daily. 04/16/21  Yes Ailene Ards, NP  Omega 3 1000 MG CAPS Take 1,000 mg by mouth daily.   Yes [provider]  polyethylene glycol-electrolytes (NULYTELY) 420 g solution As directed 03/17/21  Yes Mickel Schreur, Elon Alas, DO  senna (SENOKOT) 8.6 MG TABS tablet Take 8.6 mg by mouth daily as needed for mild constipation.   Yes [provider]  vitamin B-12 (CYANOCOBALAMIN) 500 MCG tablet Take 500 mcg by mouth daily.   Yes [provider]  vitamin E 400 UNIT capsule Take 400 Units by mouth daily.   Yes [provider]    Allergies as of 03/17/2021 - Review Complete 03/17/2021  Allergen Reaction Noted  . Codeine  12/13/2006    Family History  Problem Relation Age of Onset  . Heart disease Mother   . Diabetes Mother   . Cancer Father        bone  . Cirrhosis Father   . Stroke Maternal Grandmother   . Stroke Maternal Grandfather   . Heart disease Sister   . Obesity Brother   . Healthy Daughter     Social History   Socioeconomic History  . Marital status: Widowed    Spouse name: Not on file  . Number of children: 1  . Years of education: Not on file  . Highest education level: Not on file  Occupational History  . Occupation: Retired  Tobacco Use  . Smoking status: Current Every Day Smoker    Packs/day: 1.00    Years: 40.00    Pack years: 40.00    Types: Cigarettes  . Smokeless tobacco: Never Used  Vaping Use  . Vaping Use: Never used  Substance and Sexual Activity  . Alcohol use: No    Alcohol/week: 0.0 standard drinks  . Drug use: No  . Sexual activity: Not Currently  Other Topics Concern  . 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   Social Determinants of Health   Financial Resource Strain: Not on file  Food Insecurity: Not on file  Transportation Needs: Not on file  Physical Activity: Not on file  Stress: Not on file  Social Connections: Not on file  Intimate Partner Violence: Not on file    Review of Systems: See HPI, otherwise negative ROS  Physical Exam: Vital signs in last 24 hours:     General:   Alert,  Well-developed, well-nourished, pleasant and cooperative in NAD Head:  Normocephalic and atraumatic. Eyes:  Sclera clear, no icterus.   Conjunctiva pink. Ears:  Normal auditory acuity. Nose:  No deformity, discharge,  or lesions. Mouth:  No deformity or lesions, dentition normal. Neck:  Supple; no masses or thyromegaly. Lungs:  Clear throughout to auscultation.   No wheezes, crackles, or rhonchi. No acute  distress. Heart:  Regular rate and rhythm; no murmurs, clicks, rubs,  or gallops. Abdomen:  Soft, nontender and nondistended. No masses, hepatosplenomegaly or hernias noted. Normal bowel sounds, without guarding, and without rebound.   Msk:  Symmetrical without gross deformities. Normal posture. Extremities:  Without clubbing or edema. Neurologic:  Alert and  oriented x4;  grossly normal neurologically. Skin:  Intact without significant lesions or rashes. Cervical Nodes:  No significant cervical adenopathy. Psych:  Alert and cooperative. Normal mood and affect.  Impression/Plan: Leah [name] is here for a colonoscopy to be performed for colon cancer screening purposes.  The risks of the procedure including infection, bleed, or  perforation as well as benefits, limitations, alternatives and imponderables have been reviewed with the patient. Questions have been answered. All parties agreeable.

## 2021-04-22 NOTE — Anesthesia Preprocedure Evaluation (Signed)
Anesthesia Evaluation  Patient identified by MRN, date of birth, ID band Patient awake    Reviewed: Allergy & Precautions, NPO status , Patient's Chart, lab work & pertinent test results  History of Anesthesia Complications Negative for: history of anesthetic complications  Airway Mallampati: II  TM Distance: >3 FB Neck ROM: Full    Dental  (+) Edentulous Upper, Edentulous Lower   Pulmonary Current Smoker and Patient abstained from smoking.,    Pulmonary exam normal breath sounds clear to auscultation       Cardiovascular Exercise Tolerance: Good hypertension, Pt. on medications + CAD, + Past MI and + Cardiac Stents  Normal cardiovascular exam Rhythm:Regular Rate:Normal     Neuro/Psych negative neurological ROS  negative psych ROS   GI/Hepatic negative GI ROS,   Endo/Other  negative endocrine ROS  Renal/GU Renal disease     Musculoskeletal  (+) Arthritis ,   Abdominal   Peds  Hematology negative hematology ROS (+)   Anesthesia Other Findings   Reproductive/Obstetrics negative OB ROS                             Anesthesia Physical Anesthesia Plan  ASA: III  Anesthesia Plan: General   Post-op Pain Management:    Induction: Intravenous  PONV Risk Score and Plan: Propofol infusion  Airway Management Planned: Nasal Cannula and Natural Airway  Additional Equipment:   Intra-op Plan:   Post-operative Plan:   Informed Consent: I have reviewed the patients History and Physical, chart, labs and discussed the procedure including the risks, benefits and alternatives for the proposed anesthesia with the patient or authorized representative who has indicated his/her understanding and acceptance.       Plan Discussed with: CRNA and Surgeon  Anesthesia Plan Comments:         Anesthesia Quick Evaluation

## 2021-04-23 LAB — SURGICAL PATHOLOGY

## 2021-04-30 ENCOUNTER — Encounter (HOSPITAL_COMMUNITY): Payer: Self-pay | Admitting: Internal Medicine

## 2021-05-01 ENCOUNTER — Telehealth: Payer: Self-pay

## 2021-05-01 NOTE — Telephone Encounter (Signed)
Please NIC repeat colonoscopy in 5 years.

## 2021-09-10 ENCOUNTER — Other Ambulatory Visit: Payer: Self-pay

## 2021-09-10 ENCOUNTER — Encounter: Payer: Self-pay | Admitting: Internal Medicine

## 2021-09-10 ENCOUNTER — Ambulatory Visit (INDEPENDENT_AMBULATORY_CARE_PROVIDER_SITE_OTHER): Payer: PPO | Admitting: Internal Medicine

## 2021-09-10 VITALS — BP 133/75 | HR 82 | Temp 98.4°F | Resp 18 | Ht 63.0 in | Wt 165.0 lb

## 2021-09-10 DIAGNOSIS — Z23 Encounter for immunization: Secondary | ICD-10-CM | POA: Diagnosis not present

## 2021-09-10 DIAGNOSIS — E782 Mixed hyperlipidemia: Secondary | ICD-10-CM

## 2021-09-10 DIAGNOSIS — I700 Atherosclerosis of aorta: Secondary | ICD-10-CM

## 2021-09-10 DIAGNOSIS — I10 Essential (primary) hypertension: Secondary | ICD-10-CM

## 2021-09-10 DIAGNOSIS — I2510 Atherosclerotic heart disease of native coronary artery without angina pectoris: Secondary | ICD-10-CM | POA: Diagnosis not present

## 2021-09-10 DIAGNOSIS — Z0001 Encounter for general adult medical examination with abnormal findings: Secondary | ICD-10-CM

## 2021-09-10 DIAGNOSIS — Z0000 Encounter for general adult medical examination without abnormal findings: Secondary | ICD-10-CM

## 2021-09-10 DIAGNOSIS — R7303 Prediabetes: Secondary | ICD-10-CM

## 2021-09-10 DIAGNOSIS — Z720 Tobacco use: Secondary | ICD-10-CM | POA: Diagnosis not present

## 2021-09-10 DIAGNOSIS — E559 Vitamin D deficiency, unspecified: Secondary | ICD-10-CM | POA: Diagnosis not present

## 2021-09-10 DIAGNOSIS — J439 Emphysema, unspecified: Secondary | ICD-10-CM | POA: Diagnosis not present

## 2021-09-10 NOTE — Assessment & Plan Note (Signed)
Noted on CT chest Asymptomatic currently Advised to cut down -> quit smoking 

## 2021-09-10 NOTE — Progress Notes (Signed)
New Patient Office Visit  Subjective:  Patient ID: Leah [name], female    DOB: [birth year]  Age: 65 y.o. MRN: [id number]  CC:  Chief Complaint  Patient presents with   New Patient (Initial Visit)    New patient was seeing gosrani just establishing care     HPI Leah [name] is a 65 year old female with PMH of CAD s/p stent placement, HTN, emphysema, HLD, OA and tobacco abuse who presents for establishing care.  CAD: She has had stent placed to RCA in 1998.  She denies any anginal pain, dyspnea or palpitations currently.  She is on aspirin, statin, Zetia and metoprolol currently.  She follows up with cardiology. BP is well-controlled. Takes medications regularly. Patient denies headache, dizziness, chest pain, dyspnea or palpitations.  She smokes about a pack per day.  She had low dose CT chest in 2021, which showed emphysema and aortic atherosclerosis.  She denies any dyspnea or wheezing currently.  She is willing to cut down smoking for now.  She takes vitamin D, vitamin B12 and vitamin D supplements.  She also reports history of prediabetes.  Denies any fatigue, polyuria or polydipsia.  She received flu and PCV20 vaccine in the office today.  Past Medical History:  Diagnosis Date   Arthritis    Borderline diabetes    Breast lump on right side at 3 o'clock position    Benign, fatty mass   History of kidney stones    History of shingles    Hyperlipidemia    Hypertension    Myocardial infarction ([place]) 1996    HAD ANGIOPLASTY WITH 2 STENTS   Tinnitus    right ear    Vitamin D deficiency 10/17/2019   Wears glasses     Past Surgical History:  Procedure Laterality Date   BREAST LUMPECTOMY  >10 YRS AGO   RT BREAST   COLONOSCOPY WITH PROPOFOL N/A 04/22/2021   Procedure: COLONOSCOPY WITH PROPOFOL;  Surgeon: Eloise Harman, DO;  Location: [facility];  Service: Endoscopy;  Laterality: N/A;  11:45am   CORONARY ANGIOPLASTY  1996   ANGIOPLASTY / 2 STENTS   CYSTOSCOPY WITH  STENT PLACEMENT Right 12/30/2015   Procedure: CYSTOSCOPY;  Surgeon: Cleon Gustin, MD;  Location: [facility];  Service: Urology;  Laterality: Right;   CYSTOSCOPY WITH STENT PLACEMENT Right 01/09/2016   Procedure: CYSTOSCOPY WITH STENT PLACEMENT;  Surgeon: Cleon Gustin, MD;  Location: [facility];  Service: Urology;  Laterality: Right;   HOLMIUM LASER APPLICATION Right 1/88/4166   Procedure: HOLMIUM LASER APPLICATION;  Surgeon: Cleon Gustin, MD;  Location: [facility];  Service: Urology;  Laterality: Right;   LITHOTRIPSY     NEPHROLITHOTOMY Right 12/30/2015   Procedure: RIght Nephrostomy with access;  Surgeon: Cleon Gustin, MD;  Location: [facility];  Service: Urology;  Laterality: Right;   NEPHROLITHOTOMY Right 01/09/2016   Procedure: RIGHT PERCUTANEOUS NEPHROLITHOTOMY SECOND LOOK , POSSIBLE DILATION OF TRACT;  Surgeon: Cleon Gustin, MD;  Location: [facility];  Service: Urology;  Laterality: Right;   POLYPECTOMY  04/22/2021   Procedure: POLYPECTOMY;  Surgeon: Eloise Harman, DO;  Location: [facility];  Service: Endoscopy;;    Family History  Problem Relation Age of Onset   Heart disease Mother    Diabetes Mother    Cancer Father        bone   Cirrhosis Father    Stroke Maternal Grandmother    Stroke Maternal Grandfather    Heart disease Sister  Obesity Brother    Healthy Daughter     Social History   Socioeconomic History   Marital status: Widowed    Spouse name: Not on file   Number of children: 1   Years of education: Not on file   Highest education level: Not on file  Occupational History   Occupation: Retired  Tobacco Use   Smoking status: Every Day    Packs/day: 1.00    Years: 40.00    Pack years: 40.00    Types: Cigarettes   Smokeless tobacco: Never  Vaping Use   Vaping Use: Never used  Substance and Sexual Activity   Alcohol use: No    Alcohol/week: 0.0 standard drinks   Drug use: No   Sexual activity: Not Currently  Other Topics Concern   Not on  file  Social History Narrative   Single,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   Social Determinants of Health   Financial Resource Strain: Not on file  Food Insecurity: Not on file  Transportation Needs: Not on file  Physical Activity: Not on file  Stress: Not on file  Social Connections: Not on file  Intimate Partner Violence: Not on file    ROS Review of Systems  Constitutional:  Negative for chills and fever.  HENT:  Negative for congestion, sinus pressure, sinus pain and sore throat.   Eyes:  Negative for pain and discharge.  Respiratory:  Negative for cough and shortness of breath.   Cardiovascular:  Negative for chest pain and palpitations.  Gastrointestinal:  Negative for abdominal pain, constipation, diarrhea, nausea and vomiting.  Endocrine: Negative for polydipsia and polyuria.  Genitourinary:  Negative for dysuria and hematuria.  Musculoskeletal:  Negative for neck pain and neck stiffness.  Skin:  Negative for rash.  Neurological:  Negative for dizziness and weakness.  Psychiatric/Behavioral:  Negative for agitation and behavioral problems.    Objective:   Today's Vitals: BP 133/75 (BP Location: Left Arm, Patient Position: Sitting, Cuff Size: Normal)   Pulse 82   Temp 98.4 F (36.9 C) (Oral)   Resp 18   Ht _0  (1.6 m)   Wt 165 lb 0.6 oz (74.9 kg)   LMP 05/16/2002   SpO2 98%   BMI 29.24 kg/m   Physical Exam Vitals reviewed.  Constitutional:      General: She is not in acute distress.    Appearance: She is not diaphoretic.  HENT:     Head: Normocephalic and atraumatic.     Nose: Nose normal.     Mouth/Throat:     Mouth: Mucous membranes are moist.  Eyes:     General: No scleral icterus.    Extraocular Movements: Extraocular movements intact.  Cardiovascular:     Rate and Rhythm: Normal rate and regular rhythm.     Pulses: Normal pulses.     Heart sounds: Normal heart sounds. No murmur heard. Pulmonary:     Breath sounds: Normal breath sounds. No  wheezing or rales.  Abdominal:     Palpations: Abdomen is soft.     Tenderness: There is no abdominal tenderness.  Musculoskeletal:     Cervical back: Neck supple. No tenderness.     Right lower leg: No edema.     Left lower leg: No edema.  Skin:    General: Skin is warm.     Findings: No rash.     Comments: Subungual hematoma - great toe of left foot  Neurological:     General: No focal deficit present.  Mental Status: She is alert and oriented to person, place, and time.     Sensory: No sensory deficit.     Motor: No weakness.  Psychiatric:        Mood and Affect: Mood normal.        Behavior: Behavior normal.    Assessment & Plan:   Problem List Items Addressed This Visit       Encounter for medical examination to establish care - Primary   Care established Previous chart reviewed History and medications reviewed with the patient        Cardiovascular and Mediastinum   Essential hypertension    BP Readings from Last 1 Encounters:  09/10/21 133/75  Well-controlled with amlodipine, HCTZ and Olmesartan Counseled for compliance with the medications Advised DASH diet and moderate exercise/walking, at least 150 mins/week       Relevant Orders   CBC with Differential/Platelet   CMP14+EGFR   Coronary artery disease involving native heart without angina pectoris    S/p stent placement, followed by cardiology On aspirin and statin On metoprolol No anginal symptoms currently.      Aortic atherosclerosis (HCC)    Noted on CT chest On aspirin and statin      Relevant Orders   Lipid panel     Respiratory   Pulmonary emphysema (HCC)    Noted on CT chest Asymptomatic currently Advised to cut down -> quit smoking      Relevant Orders   CBC with Differential/Platelet     Other   Hyperlipidemia    On statin, Zetia and omega-3 Check lipid profile      Relevant Orders   Lipid panel   Vitamin D deficiency    Check vitamin D level      Relevant  Orders   Vitamin D (25 hydroxy)         Tobacco abuse    Smokes about 1 pack/day X >45 years  Asked about quitting: confirms that she currently smokes cigarettes Advise to quit smoking: Educated about QUITTING to reduce the risk of cancer, cardio and cerebrovascular disease. Assess willingness: Unwilling to quit at this time, but is working on cutting back. Assist with counseling and pharmacotherapy: Counseled for 5 minutes and literature provided. Arrange for follow up: Follow up in 3 months and continue to offer help.      Other Visit Diagnoses     Need for immunization against influenza       Relevant Orders   Flu Vaccine QUAD High Dose(Fluad) (Completed)   Prediabetes       Relevant Orders   CMP14+EGFR   HgB A1c       Outpatient Encounter Medications as of 09/10/2021  Medication Sig   amLODipine (NORVASC) 10 MG tablet Take 1 tablet (10 mg total) by mouth daily.   aspirin EC 81 MG tablet Take 81 mg by mouth every morning.    atorvastatin (LIPITOR) 80 MG tablet Take 1 tablet (80 mg total) by mouth daily.   Blood Pressure Monitoring (BLOOD PRESSURE MONITOR AUTOMAT) DEVI 1 Device by Does not apply route daily. Check blood pressure daily   Ergocalciferol (VITAMIN D2) 50 MCG (2000 UT) TABS Take 2,000 Units by mouth daily.   hydrochlorothiazide (HYDRODIURIL) 12.5 MG tablet Take 1 tablet (12.5 mg total) by mouth daily.   ibuprofen (ADVIL) 200 MG tablet Take 400 mg by mouth every 6 (six) hours as needed for headache or moderate pain.   metoprolol tartrate (LOPRESSOR) 50 MG tablet Take 1  tablet (50 mg total) by mouth daily.   Multiple Vitamin (MULTIVITAMIN WITH MINERALS) TABS tablet Take 1 tablet by mouth every morning.   olmesartan (BENICAR) 20 MG tablet Take 1 tablet (20 mg total) by mouth daily.   Omega 3 1000 MG CAPS Take 1,000 mg by mouth daily.   vitamin B-12 (CYANOCOBALAMIN) 500 MCG tablet Take 500 mcg by mouth daily.   vitamin E 400 UNIT capsule Take 400 Units by mouth  daily.   ezetimibe (ZETIA) 10 MG tablet Take 1 tablet (10 mg total) by mouth daily.   [DISCONTINUED] polyethylene glycol-electrolytes (NULYTELY) 420 g solution As directed (Patient not taking: Reported on 09/10/2021)   [DISCONTINUED] senna (SENOKOT) 8.6 MG TABS tablet Take 8.6 mg by mouth daily as needed for mild constipation. (Patient not taking: Reported on 09/10/2021)   No facility-administered encounter medications on file as of 09/10/2021.    Follow-up: Return in about 6 months (around 03/11/2022) for Annual physical.   Lindell Spar, MD

## 2021-09-10 NOTE — Assessment & Plan Note (Signed)
Check vitamin D level 

## 2021-09-10 NOTE — Assessment & Plan Note (Signed)
Noted on CT chest On aspirin and statin 

## 2021-09-10 NOTE — Assessment & Plan Note (Signed)
Care established Previous chart reviewed History and medications reviewed with the patient 

## 2021-09-10 NOTE — Assessment & Plan Note (Signed)
Check HbA1c Continue to follow low-carb diet and ambulate as tolerated 

## 2021-09-10 NOTE — Assessment & Plan Note (Signed)
On statin, Zetia and omega-3 Check lipid profile 

## 2021-09-10 NOTE — Assessment & Plan Note (Signed)
Smokes about 1 pack/day X >45 years  Asked about quitting: confirms that she currently smokes cigarettes Advise to quit smoking: Educated about QUITTING to reduce the risk of cancer, cardio and cerebrovascular disease. Assess willingness: Unwilling to quit at this time, but is working on cutting back. Assist with counseling and pharmacotherapy: Counseled for 5 minutes and literature provided. Arrange for follow up: Follow up in 3 months and continue to offer help.

## 2021-09-10 NOTE — Assessment & Plan Note (Addendum)
S/p stent placement, followed by cardiology On aspirin and statin On metoprolol No anginal symptoms currently 

## 2021-09-10 NOTE — Assessment & Plan Note (Signed)
BP Readings from Last 1 Encounters:  09/10/21 133/75   Well-controlled with amlodipine, HCTZ and Olmesartan Counseled for compliance with the medications Advised DASH diet and moderate exercise/walking, at least 150 mins/week

## 2021-09-10 NOTE — Patient Instructions (Signed)
Please continue taking medications as prescribed.  Please try to cut down -> quit smoking.  Please continue to follow DASH diet and ambulate as tolerated.

## 2021-09-11 LAB — CBC WITH DIFFERENTIAL/PLATELET
Basophils Absolute: 0.1 10*3/uL (ref 0.0–0.2)
Basos: 1 %
EOS (ABSOLUTE): 0.1 10*3/uL (ref 0.0–0.4)
Eos: 1 %
Hematocrit: 42.7 % (ref 34.0–46.6)
Hemoglobin: 14.5 g/dL (ref 11.1–15.9)
Immature Grans (Abs): 0 10*3/uL (ref 0.0–0.1)
Immature Granulocytes: 0 %
Lymphocytes Absolute: 2.7 10*3/uL (ref 0.7–3.1)
Lymphs: 42 %
MCH: 30 pg (ref 26.6–33.0)
MCHC: 34 g/dL (ref 31.5–35.7)
MCV: 88 fL (ref 79–97)
Monocytes Absolute: 0.4 10*3/uL (ref 0.1–0.9)
Monocytes: 6 %
Neutrophils Absolute: 3.2 10*3/uL (ref 1.4–7.0)
Neutrophils: 50 %
Platelets: 221 10*3/uL (ref 150–450)
RBC: 4.83 x10E6/uL (ref 3.77–5.28)
RDW: 13.2 % (ref 11.7–15.4)
WBC: 6.4 10*3/uL (ref 3.4–10.8)

## 2021-09-11 LAB — LIPID PANEL
Chol/HDL Ratio: 2.7 ratio (ref 0.0–4.4)
Cholesterol, Total: 128 mg/dL (ref 100–199)
HDL: 48 mg/dL (ref >39)
LDL Chol Calc (NIH): 67 mg/dL (ref 0–99)
Triglycerides: 61 mg/dL (ref 0–149)
VLDL Cholesterol Cal: 13 mg/dL (ref 5–40)

## 2021-09-11 LAB — CMP14+EGFR
ALT: 21 IU/L (ref 0–32)
AST: 23 IU/L (ref 0–40)
Albumin/Globulin Ratio: 1.9 (ref 1.2–2.2)
Albumin: 4.5 g/dL (ref 3.8–4.8)
Alkaline Phosphatase: 139 IU/L — ABNORMAL HIGH (ref 44–121)
BUN/Creatinine Ratio: 11 — ABNORMAL LOW (ref 12–28)
BUN: 11 mg/dL (ref 8–27)
Bilirubin Total: 0.4 mg/dL (ref 0.0–1.2)
CO2: 24 mmol/L (ref 20–29)
Calcium: 9.8 mg/dL (ref 8.7–10.3)
Chloride: 99 mmol/L (ref 96–106)
Creatinine, Ser: 1.04 mg/dL — ABNORMAL HIGH (ref 0.57–1.00)
Globulin, Total: 2.4 g/dL (ref 1.5–4.5)
Glucose: 98 mg/dL (ref 70–99)
Potassium: 4.6 mmol/L (ref 3.5–5.2)
Sodium: 139 mmol/L (ref 134–144)
Total Protein: 6.9 g/dL (ref 6.0–8.5)
eGFR: 59 mL/min/{1.73_m2} — ABNORMAL LOW (ref >59)

## 2021-09-11 LAB — HEMOGLOBIN A1C
Est. average glucose Bld gHb Est-mCnc: 131 mg/dL
Hgb A1c MFr Bld: 6.2 % — ABNORMAL HIGH (ref 4.8–5.6)

## 2021-09-11 LAB — VITAMIN D 25 HYDROXY (VIT D DEFICIENCY, FRACTURES): Vit D, 25-Hydroxy: 68.2 ng/mL (ref 30.0–100.0)

## 2021-10-01 ENCOUNTER — Other Ambulatory Visit (INDEPENDENT_AMBULATORY_CARE_PROVIDER_SITE_OTHER): Payer: Self-pay | Admitting: Nurse Practitioner

## 2021-10-01 DIAGNOSIS — I2510 Atherosclerotic heart disease of native coronary artery without angina pectoris: Secondary | ICD-10-CM

## 2021-10-01 DIAGNOSIS — I10 Essential (primary) hypertension: Secondary | ICD-10-CM

## 2021-10-29 ENCOUNTER — Other Ambulatory Visit (INDEPENDENT_AMBULATORY_CARE_PROVIDER_SITE_OTHER): Payer: Self-pay | Admitting: Internal Medicine

## 2021-10-29 DIAGNOSIS — I10 Essential (primary) hypertension: Secondary | ICD-10-CM

## 2021-11-25 ENCOUNTER — Other Ambulatory Visit: Payer: Self-pay

## 2021-11-25 ENCOUNTER — Ambulatory Visit (INDEPENDENT_AMBULATORY_CARE_PROVIDER_SITE_OTHER): Payer: PPO

## 2021-11-25 DIAGNOSIS — Z0000 Encounter for general adult medical examination without abnormal findings: Secondary | ICD-10-CM | POA: Diagnosis not present

## 2021-11-25 NOTE — Patient Instructions (Addendum)
Ms. Leah [name] , Thank you for taking time to come for your Medicare Wellness Visit. I appreciate your ongoing commitment to your health goals. Please review the following plan we discussed and let me know if I can assist you in the future.   These are the goals we discussed:  Goals   None     This is a list of the screening recommended for you and due dates:  Health Maintenance  Topic Date Due   Tetanus Vaccine  Never done   Zoster (Shingles) Vaccine (1 of 2) Never done   COVID-19 Vaccine (5 - Booster for Pfizer series) 05/02/2021   Mammogram  11/11/2022   Colon Cancer Screening  04/23/2031   Pneumonia Vaccine  Completed   Flu Shot  Completed   DEXA scan (bone density measurement)  Completed   Hepatitis C Screening: USPSTF Recommendation to screen - Ages 75-79 yo.  Completed   HPV Vaccine  Aged Out    Health Maintenance, Female Adopting a healthy lifestyle and getting preventive care are important in promoting health and wellness. Ask your health care provider about: The right schedule for you to have regular tests and exams. Things you can do on your own to prevent diseases and keep yourself healthy. What should I know about diet, weight, and exercise? Eat a healthy diet  Eat a diet that includes plenty of vegetables, fruits, low-fat dairy products, and lean protein. Do not eat a lot of foods that are high in solid fats, added sugars, or sodium. Maintain a healthy weight Body mass index (BMI) is used to identify weight problems. It estimates body fat based on height and weight. Your health care provider can help determine your BMI and help you achieve or maintain a healthy weight. Get regular exercise Get regular exercise. This is one of the most important things you can do for your health. Most adults should: Exercise for at least 150 minutes each week. The exercise should increase your heart rate and make you sweat (moderate-intensity exercise). Do strengthening  exercises at least twice a week. This is in addition to the moderate-intensity exercise. Spend less time sitting. Even light physical activity can be beneficial. Watch cholesterol and blood lipids Have your blood tested for lipids and cholesterol at 68 years of age, then have this test every 5 years. Have your cholesterol levels checked more often if: Your lipid or cholesterol levels are high. You are older than 68 years of age. You are at high risk for heart disease. What should I know about cancer screening? Depending on your health history and family history, you may need to have cancer screening at various ages. This may include screening for: Breast cancer. Cervical cancer. Colorectal cancer. Skin cancer. Lung cancer. What should I know about heart disease, diabetes, and high blood pressure? Blood pressure and heart disease High blood pressure causes heart disease and increases the risk of stroke. This is more likely to develop in people who have high blood pressure readings or are overweight. Have your blood pressure checked: Every 3-5 years if you are 76-67 years of age. Every year if you are 67 years old or older. Diabetes Have regular diabetes screenings. This checks your fasting blood sugar level. Have the screening done: Once every three years after age 30 if you are at a normal weight and have a low risk for diabetes. More often and at a younger age if you are overweight or have a high risk for diabetes. What should I know  about preventing infection? Hepatitis B If you have a higher risk for hepatitis B, you should be screened for this virus. Talk with your health care provider to find out if you are at risk for hepatitis B infection. Hepatitis C Testing is recommended for: Everyone born from [birth year] through 1967. Anyone with known risk factors for hepatitis C. Sexually transmitted infections (STIs) Get screened for STIs, including gonorrhea and chlamydia, if: You are  sexually active and are younger than 68 years of age. You are older than 68 years of age and your health care provider tells you that you are at risk for this type of infection. Your sexual activity has changed since you were last screened, and you are at increased risk for chlamydia or gonorrhea. Ask your health care provider if you are at risk. Ask your health care provider about whether you are at high risk for HIV. Your health care provider may recommend a prescription medicine to help prevent HIV infection. If you choose to take medicine to prevent HIV, you should first get tested for HIV. You should then be tested every 3 months for as long as you are taking the medicine. Pregnancy If you are about to stop having your period (premenopausal) and you may become pregnant, seek counseling before you get pregnant. Take 400 to 800 micrograms (mcg) of folic acid every day if you become pregnant. Ask for birth control (contraception) if you want to prevent pregnancy. Osteoporosis and menopause Osteoporosis is a disease in which the bones lose minerals and strength with aging. This can result in bone fractures. If you are 81 years old or older, or if you are at risk for osteoporosis and fractures, ask your health care provider if you should: Be screened for bone loss. Take a calcium or vitamin D supplement to lower your risk of fractures. Be given hormone replacement therapy (HRT) to treat symptoms of menopause. Follow these instructions at home: Alcohol use Do not drink alcohol if: Your health care provider tells you not to drink. You are pregnant, may be pregnant, or are planning to become pregnant. If you drink alcohol: Limit how much you have to: 0-1 drink a day. Know how much alcohol is in your drink. In the [place] one drink equals one 12 oz bottle of beer (355 mL), one 5 oz glass of wine (148 mL), or one 1 oz glass of hard liquor (44 mL). Lifestyle Do not use any products that contain  nicotine or tobacco. These products include cigarettes, chewing tobacco, and vaping devices, such as e-cigarettes. If you need help quitting, ask your health care provider. Do not use street drugs. Do not share needles. Ask your health care provider for help if you need support or information about quitting drugs. General instructions Schedule regular health, dental, and eye exams. Stay current with your vaccines. Tell your health care provider if: You often feel depressed. You have ever been abused or do not feel safe at home. Summary Adopting a healthy lifestyle and getting preventive care are important in promoting health and wellness. Follow your health care provider's instructions about healthy diet, exercising, and getting tested or screened for diseases. Follow your health care provider's instructions on monitoring your cholesterol and blood pressure. This information is not intended to replace advice given to you by your health care provider. Make sure you discuss any questions you have with your health care provider. Document Revised: 03/24/2021 Document Reviewed: 03/24/2021 Elsevier Patient Education  [place] Maintenance,  Female Adopting a healthy lifestyle and getting preventive care are important in promoting health and wellness. Ask your health care provider about: The right schedule for you to have regular tests and exams. Things you can do on your own to prevent diseases and keep yourself healthy. What should I know about diet, weight, and exercise? Eat a healthy diet  Eat a diet that includes plenty of vegetables, fruits, low-fat dairy products, and lean protein. Do not eat a lot of foods that are high in solid fats, added sugars, or sodium. Maintain a healthy weight Body mass index (BMI) is used to identify weight problems. It estimates body fat based on height and weight. Your health care provider can help determine your BMI and help you achieve or  maintain a healthy weight. Get regular exercise Get regular exercise. This is one of the most important things you can do for your health. Most adults should: Exercise for at least 150 minutes each week. The exercise should increase your heart rate and make you sweat (moderate-intensity exercise). Do strengthening exercises at least twice a week. This is in addition to the moderate-intensity exercise. Spend less time sitting. Even light physical activity can be beneficial. Watch cholesterol and blood lipids Have your blood tested for lipids and cholesterol at 68 years of age, then have this test every 5 years. Have your cholesterol levels checked more often if: Your lipid or cholesterol levels are high. You are older than 68 years of age. You are at high risk for heart disease. What should I know about cancer screening? Depending on your health history and family history, you may need to have cancer screening at various ages. This may include screening for: Breast cancer. Cervical cancer. Colorectal cancer. Skin cancer. Lung cancer. What should I know about heart disease, diabetes, and high blood pressure? Blood pressure and heart disease High blood pressure causes heart disease and increases the risk of stroke. This is more likely to develop in people who have high blood pressure readings or are overweight. Have your blood pressure checked: Every 3-5 years if you are 109-64 years of age. Every year if you are 59 years old or older. Diabetes Have regular diabetes screenings. This checks your fasting blood sugar level. Have the screening done: Once every three years after age 47 if you are at a normal weight and have a low risk for diabetes. More often and at a younger age if you are overweight or have a high risk for diabetes. What should I know about preventing infection? Hepatitis B If you have a higher risk for hepatitis B, you should be screened for this virus. Talk with your health  care provider to find out if you are at risk for hepatitis B infection. Hepatitis C Testing is recommended for: Everyone born from [birth year] through 1967. Anyone with known risk factors for hepatitis C. Sexually transmitted infections (STIs) Get screened for STIs, including gonorrhea and chlamydia, if: You are sexually active and are younger than 68 years of age. You are older than 68 years of age and your health care provider tells you that you are at risk for this type of infection. Your sexual activity has changed since you were last screened, and you are at increased risk for chlamydia or gonorrhea. Ask your health care provider if you are at risk. Ask your health care provider about whether you are at high risk for HIV. Your health care provider may recommend a prescription medicine to help prevent HIV infection.  If you choose to take medicine to prevent HIV, you should first get tested for HIV. You should then be tested every 3 months for as long as you are taking the medicine. Pregnancy If you are about to stop having your period (premenopausal) and you may become pregnant, seek counseling before you get pregnant. Take 400 to 800 micrograms (mcg) of folic acid every day if you become pregnant. Ask for birth control (contraception) if you want to prevent pregnancy. Osteoporosis and menopause Osteoporosis is a disease in which the bones lose minerals and strength with aging. This can result in bone fractures. If you are 91 years old or older, or if you are at risk for osteoporosis and fractures, ask your health care provider if you should: Be screened for bone loss. Take a calcium or vitamin D supplement to lower your risk of fractures. Be given hormone replacement therapy (HRT) to treat symptoms of menopause. Follow these instructions at home: Alcohol use Do not drink alcohol if: Your health care provider tells you not to drink. You are pregnant, may be pregnant, or are planning to become  pregnant. If you drink alcohol: Limit how much you have to: 0-1 drink a day. Know how much alcohol is in your drink. In the [place] one drink equals one 12 oz bottle of beer (355 mL), one 5 oz glass of wine (148 mL), or one 1 oz glass of hard liquor (44 mL). Lifestyle Do not use any products that contain nicotine or tobacco. These products include cigarettes, chewing tobacco, and vaping devices, such as e-cigarettes. If you need help quitting, ask your health care provider. Do not use street drugs. Do not share needles. Ask your health care provider for help if you need support or information about quitting drugs. General instructions Schedule regular health, dental, and eye exams. Stay current with your vaccines. Tell your health care provider if: You often feel depressed. You have ever been abused or do not feel safe at home. Summary Adopting a healthy lifestyle and getting preventive care are important in promoting health and wellness. Follow your health care provider's instructions about healthy diet, exercising, and getting tested or screened for diseases. Follow your health care provider's instructions on monitoring your cholesterol and blood pressure. This information is not intended to replace advice given to you by your health care provider. Make sure you discuss any questions you have with your health care provider. Document Revised: 03/24/2021 Document Reviewed: 03/24/2021 Elsevier Patient Education  [place].

## 2021-11-25 NOTE — Progress Notes (Signed)
Subjective:   Leah [name] is a 66 y.o. female who presents for an Initial Medicare Annual Wellness Visit. I connected with  Leah [name] on 11/25/21 by a audio enabled telemedicine application and verified that I am speaking with the correct person using two identifiers.  Patient Location: Home  Provider Location: Office/Clinic  I discussed the limitations of evaluation and management by telemedicine. The patient expressed understanding and agreed to proceed.  Review of Systems    Defer to pcp Cardiac Risk Factors include: advanced age (>50men, >72 women);hypertension     Objective:    There were no vitals filed for this visit. There is no height or weight on file to calculate BMI.  Advanced Directives 11/25/2021 04/22/2021 04/18/2021 06/05/2020 01/24/2020 12/12/2019 12/11/2019  Does Patient Have a Medical Advance Directive? No No No No No No No  Does patient want to make changes to medical advance directive? - - - No - Patient declined - - -  Would patient like information on creating a medical advance directive? No - Patient declined No - Patient declined No - Patient declined No - Patient declined No - Patient declined No - Patient declined No - Patient declined    Current Medications (verified) Outpatient Encounter Medications as of 11/25/2021  Medication Sig   amLODipine (NORVASC) 10 MG tablet Take 1 tablet (10 mg total) by mouth daily.   aspirin EC 81 MG tablet Take 81 mg by mouth every morning.    atorvastatin (LIPITOR) 80 MG tablet Take 1 tablet (80 mg total) by mouth daily.   Blood Pressure Monitoring (BLOOD PRESSURE MONITOR AUTOMAT) DEVI 1 Device by Does not apply route daily. Check blood pressure daily   Ergocalciferol (VITAMIN D2) 50 MCG (2000 UT) TABS Take 2,000 Units by mouth daily.   hydrochlorothiazide (HYDRODIURIL) 12.5 MG tablet Take 1 tablet (12.5 mg total) by mouth daily.   ibuprofen (ADVIL) 200 MG tablet Take 400 mg by mouth every 6 (six) hours as needed for  headache or moderate pain.   metoprolol tartrate (LOPRESSOR) 50 MG tablet Take 1 tablet (50 mg total) by mouth daily.   Multiple Vitamin (MULTIVITAMIN WITH MINERALS) TABS tablet Take 1 tablet by mouth every morning.   olmesartan (BENICAR) 20 MG tablet TAKE (1) TABLET BY MOUTH ONCE DAILY.   Omega 3 1000 MG CAPS Take 1,000 mg by mouth daily.   vitamin B-12 (CYANOCOBALAMIN) 500 MCG tablet Take 500 mcg by mouth daily.   vitamin E 400 UNIT capsule Take 400 Units by mouth daily.   ezetimibe (ZETIA) 10 MG tablet Take 1 tablet (10 mg total) by mouth daily.   No facility-administered encounter medications on file as of 11/25/2021.    Allergies (verified) Codeine   History: Past Medical History:  Diagnosis Date   Arthritis    Borderline diabetes    Breast lump on right side at 3 o'clock position    Benign, fatty mass   History of kidney stones    History of shingles    Hyperlipidemia    Hypertension    Myocardial infarction ([place]) 1996    HAD ANGIOPLASTY WITH 2 STENTS   Tinnitus    right ear    Vitamin D deficiency 10/17/2019   Wears glasses    Past Surgical History:  Procedure Laterality Date   BREAST LUMPECTOMY  >10 YRS AGO   RT BREAST   COLONOSCOPY WITH PROPOFOL N/A 04/22/2021   Procedure: COLONOSCOPY WITH PROPOFOL;  Surgeon: Eloise Harman, DO;  Location: [facility];  Service: Endoscopy;  Laterality: N/A;  11:45am   CORONARY ANGIOPLASTY  1996   ANGIOPLASTY / 2 STENTS   CYSTOSCOPY WITH STENT PLACEMENT Right 12/30/2015   Procedure: CYSTOSCOPY;  Surgeon: Cleon Gustin, MD;  Location: [facility];  Service: Urology;  Laterality: Right;   CYSTOSCOPY WITH STENT PLACEMENT Right 01/09/2016   Procedure: CYSTOSCOPY WITH STENT PLACEMENT;  Surgeon: Cleon Gustin, MD;  Location: [facility];  Service: Urology;  Laterality: Right;   HOLMIUM LASER APPLICATION Right 05/07/6332   Procedure: HOLMIUM LASER APPLICATION;  Surgeon: Cleon Gustin, MD;  Location: [facility];  Service: Urology;   Laterality: Right;   LITHOTRIPSY     NEPHROLITHOTOMY Right 12/30/2015   Procedure: RIght Nephrostomy with access;  Surgeon: Cleon Gustin, MD;  Location: [facility];  Service: Urology;  Laterality: Right;   NEPHROLITHOTOMY Right 01/09/2016   Procedure: RIGHT PERCUTANEOUS NEPHROLITHOTOMY SECOND LOOK , POSSIBLE DILATION OF TRACT;  Surgeon: Cleon Gustin, MD;  Location: [facility];  Service: Urology;  Laterality: Right;   POLYPECTOMY  04/22/2021   Procedure: POLYPECTOMY;  Surgeon: Eloise Harman, DO;  Location: [facility];  Service: Endoscopy;;   Family History  Problem Relation Age of Onset   Heart disease Mother    Diabetes Mother    Cancer Father        bone   Cirrhosis Father    Stroke Maternal Grandmother    Stroke Maternal Grandfather    Heart disease Sister    Obesity Brother    Healthy Daughter    Social History   Socioeconomic History   Marital status: Widowed    Spouse name: Not on file   Number of children: 1   Years of education: 20   Highest education level: Master's degree (e.g., MA, MS, MEng, MEd, MSW, MBA)  Occupational History   Occupation: Retired  Tobacco Use   Smoking status: Every Day    Packs/day: 1.00    Years: 40.00    Pack years: 40.00    Types: Cigarettes   Smokeless tobacco: Never  Vaping Use   Vaping Use: Never used  Substance and Sexual Activity   Alcohol use: No    Alcohol/week: 0.0 standard drinks   Drug use: No   Sexual activity: Yes  Other Topics Concern   Not on file  Social History Narrative   Single,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   Social Determinants of Health   Financial Resource Strain: Low Risk    Difficulty of Paying Living Expenses: Not hard at all  Food Insecurity: No Food Insecurity   Worried About Charity fundraiser in the Last Year: Never true   [place] in the Last Year: Never true  Transportation Needs: No Transportation Needs   Lack of Transportation (Medical): No   Lack of Transportation  (Non-Medical): No  Physical Activity: Sufficiently Active   Days of Exercise per Week: 5 days   Minutes of Exercise per Session: 30 min  Stress: No Stress Concern Present   Feeling of Stress : Not at all  Social Connections: Socially Isolated   Frequency of Communication with Friends and Family: More than three times a week   Frequency of Social Gatherings with Friends and Family: Once a week   Attends Religious Services: Never   Marine scientist or Organizations: No   Attends Archivist Meetings: Never   Marital Status: Widowed    Tobacco Counseling Ready to quit: No Counseling given: Not Answered  Clinical Intake:  Pre-visit preparation completed: No  Pain : No/denies pain     Nutritional Risks: None Diabetes: No  How often do you need to have someone help you when you read instructions, pamphlets, or other written materials from your doctor or pharmacy?: 1 - Never What is the last grade level you completed in school?: 12  Diabetic?no  Interpreter Needed?: No  Information entered by :: [place] of Daily Living In your present state of health, do you have any difficulty performing the following activities: 11/25/2021 04/18/2021  Hearing? N N  Vision? N N  Difficulty concentrating or making decisions? N N  Walking or climbing stairs? N N  Dressing or bathing? N N  Doing errands, shopping? N N  Preparing Food and eating ? N -  Using the Toilet? N -  In the past six months, have you accidently leaked urine? N -  Do you have problems with loss of bowel control? N -  Managing your Medications? N -  Managing your Finances? N -  Housekeeping or managing your Housekeeping? N -  Some recent data might be hidden    Patient Care Team: Lindell Spar, MD as PCP - General (Internal Medicine) Harl Bowie Alphonse Guild, MD as PCP - Cardiology (Cardiology) Eloise Harman, DO as Consulting Physician (Internal Medicine)  Indicate any recent Medical  Services you may have received from other than [facility] providers in the past year (date may be approximate).     Assessment:   This is a routine wellness examination for [facility].  Hearing/Vision screen No results found.  Dietary issues and exercise activities discussed: Current Exercise Habits: Home exercise routine, Type of exercise: stretching;walking, Time (Minutes): 30, Frequency (Times/Week): 5, Weekly Exercise (Minutes/Week): 150, Intensity: Moderate   Goals Addressed   None    Depression Screen PHQ 2/9 Scores 11/25/2021 09/10/2021 12/11/2020 04/11/2020 10/17/2019  PHQ - 2 Score 0 0 0 0 0  PHQ- 9 Score - - 0 - -    Fall Risk Fall Risk  11/25/2021 09/10/2021 12/11/2020 04/11/2020 03/28/2020  Falls in the past year? 0 0 0 0 0  Number falls in past yr: 0 0 - 0 0  Injury with Fall? 0 0 - 0 0  Risk for fall due to : No Fall Risks No Fall Risks - - -  Follow up Falls evaluation completed Falls evaluation completed - - -    FALL RISK PREVENTION PERTAINING TO THE HOME:  Any stairs in or around the home? Yes  If so, are there any without handrails? Yes  Home free of loose throw rugs in walkways, pet beds, electrical cords, etc? Yes  Adequate lighting in your home to reduce risk of falls? Yes   ASSISTIVE DEVICES UTILIZED TO PREVENT FALLS:  Life alert? No  Use of a cane, walker or w/c? No  Grab bars in the bathroom? Yes  Shower chair or bench in shower? No  Elevated toilet seat or a handicapped toilet? No     Cognitive Function:     6CIT Screen 11/25/2021  What Year? 0 points  What month? 0 points  What time? 0 points  Count back from 20 0 points  Months in reverse 0 points  Repeat phrase 0 points  Total Score 0    Immunizations Immunization History  Administered Date(s) Administered   Fluad Quad(high Dose 65+) 10/17/2019, 09/04/2020, 09/10/2021   H1N1 10/10/2008   Influenza Whole 12/07/2006, 08/29/2008   Influenza,inj,Quad PF,6+ Mos 01/10/2016  PFIZER(Purple  Top)SARS-COV-2 Vaccination 02/29/2020, 03/22/2020, 09/27/2020, 03/07/2021   PNEUMOCOCCAL CONJUGATE-20 09/10/2021   Pneumococcal Polysaccharide-23 07/13/2008, 10/17/2019    TDAP status: Due, Education has been provided regarding the importance of this vaccine. Advised may receive this vaccine at local pharmacy or Health Dept. Aware to provide a copy of the vaccination record if obtained from local pharmacy or Health Dept. Verbalized acceptance and understanding.  Flu Vaccine status: Up to date  Pneumococcal vaccine status: Up to date  Covid-19 vaccine status: Information provided on how to obtain vaccines.   Qualifies for Shingles Vaccine? Yes   Zostavax completed No   Shingrix Completed?: No.    Education has been provided regarding the importance of this vaccine. Patient has been advised to call insurance company to determine out of pocket expense if they have not yet received this vaccine. Advised may also receive vaccine at local pharmacy or Health Dept. Verbalized acceptance and understanding.  Screening Tests Health Maintenance  Topic Date Due   TETANUS/TDAP  Never done   Zoster Vaccines- Shingrix (1 of 2) Never done   COVID-19 Vaccine (5 - Booster for Pfizer series) 05/02/2021   MAMMOGRAM  11/11/2022   COLONOSCOPY (Pts 45-91yrs Insurance coverage will need to be confirmed)  04/23/2031   Pneumonia Vaccine 33+ Years old  Completed   INFLUENZA VACCINE  Completed   DEXA SCAN  Completed   Hepatitis C Screening  Completed   HPV VACCINES  Aged Out    Health Maintenance  Health Maintenance Due  Topic Date Due   TETANUS/TDAP  Never done   Zoster Vaccines- Shingrix (1 of 2) Never done   COVID-19 Vaccine (5 - Booster for Pfizer series) 05/02/2021    Colorectal cancer screening: Type of screening: Colonoscopy. Completed 04/22/2021. Repeat every 10 years  Mammogram status: Completed 11/11/2020. Repeat every year  Bone Density status: Completed 11/26/2016. Results reflect: Bone  density results: OSTEOPENIA. Repeat every 2 years.  Lung Cancer Screening: (Low Dose CT Chest recommended if Age 50-80 years, 30 pack-year currently smoking OR have quit w/in 15years.)  does notqualify.   Lung Cancer Screening Referral: n/a  Additional Screening:  Hepatitis C Screening: does qualify; Completed 10/17/2019  Vision Screening: Recommended annual ophthalmology exams for early detection of glaucoma and other disorders of the eye. Is the patient up to date with their annual eye exam?  Yes  Who is the provider or what is the name of the office in which the patient attends annual eye exams? [facility]  If pt is not established with a provider, would they like to be referred to a provider to establish care? No .   Dental Screening: Recommended annual dental exams for proper oral hygiene  Community Resource Referral / Chronic Care Management: CRR required this visit?  No   CCM required this visit?  No      Plan:     I have personally reviewed and noted the following in the patients chart:   Medical and social history Use of alcohol, tobacco or illicit drugs  Current medications and supplements including opioid prescriptions. Patient is not currently taking opioid prescriptions. Functional ability and status Nutritional status Physical activity Advanced directives List of other physicians Hospitalizations, surgeries, and ER visits in previous 12 months Vitals Screenings to include cognitive, depression, and falls Referrals and appointments  In addition, I have reviewed and discussed with patient certain preventive protocols, quality metrics, and best practice recommendations. A written personalized care plan for preventive services as well as general  preventive health recommendations were provided to patient.     Earline [place], [place]   11/25/2021   Nurse Notes:  Ms. Duce , Thank you for taking time to come for your Medicare Wellness Visit. I appreciate your  ongoing commitment to your health goals. Please review the following plan we discussed and let me know if I can assist you in the future.   These are the goals we discussed:  Goals   None     This is a list of the screening recommended for you and due dates:  Health Maintenance  Topic Date Due   Tetanus Vaccine  Never done   Zoster (Shingles) Vaccine (1 of 2) Never done   COVID-19 Vaccine (5 - Booster for Pfizer series) 05/02/2021   Mammogram  11/11/2022   Colon Cancer Screening  04/23/2031   Pneumonia Vaccine  Completed   Flu Shot  Completed   DEXA scan (bone density measurement)  Completed   Hepatitis C Screening: USPSTF Recommendation to screen - Ages 26-79 yo.  Completed   HPV Vaccine  Aged Out

## 2021-11-25 NOTE — Progress Notes (Addendum)
Subjective:   Leah [name] is a 66 y.o. female who presents for Medicare Annual (Subsequent) preventive examination. I connected with  Leah [name] on 11/25/21 by a audio enabled telemedicine application and verified that I am speaking with the correct person using two identifiers.  Patient Location: Home  Provider Location: Office/Clinic  I discussed the limitations of evaluation and management by telemedicine. The patient expressed understanding and agreed to proceed.  Review of Systems    Defer to PCP Cardiac Risk Factors include: advanced age (>8men, >18 women);hypertension     Objective:    There were no vitals filed for this visit. There is no height or weight on file to calculate BMI.  Advanced Directives 11/25/2021 04/22/2021 04/18/2021 06/05/2020 01/24/2020 12/12/2019 12/11/2019  Does Patient Have a Medical Advance Directive? No No No No No No No  Does patient want to make changes to medical advance directive? - - - No - Patient declined - - -  Would patient like information on creating a medical advance directive? No - Patient declined No - Patient declined No - Patient declined No - Patient declined No - Patient declined No - Patient declined No - Patient declined    Current Medications (verified) Outpatient Encounter Medications as of 11/25/2021  Medication Sig   amLODipine (NORVASC) 10 MG tablet Take 1 tablet (10 mg total) by mouth daily.   aspirin EC 81 MG tablet Take 81 mg by mouth every morning.    atorvastatin (LIPITOR) 80 MG tablet Take 1 tablet (80 mg total) by mouth daily.   Blood Pressure Monitoring (BLOOD PRESSURE MONITOR AUTOMAT) DEVI 1 Device by Does not apply route daily. Check blood pressure daily   Ergocalciferol (VITAMIN D2) 50 MCG (2000 UT) TABS Take 2,000 Units by mouth daily.   hydrochlorothiazide (HYDRODIURIL) 12.5 MG tablet Take 1 tablet (12.5 mg total) by mouth daily.   ibuprofen (ADVIL) 200 MG tablet Take 400 mg by mouth every 6 (six) hours as needed  for headache or moderate pain.   metoprolol tartrate (LOPRESSOR) 50 MG tablet Take 1 tablet (50 mg total) by mouth daily.   Multiple Vitamin (MULTIVITAMIN WITH MINERALS) TABS tablet Take 1 tablet by mouth every morning.   olmesartan (BENICAR) 20 MG tablet TAKE (1) TABLET BY MOUTH ONCE DAILY.   Omega 3 1000 MG CAPS Take 1,000 mg by mouth daily.   vitamin B-12 (CYANOCOBALAMIN) 500 MCG tablet Take 500 mcg by mouth daily.   vitamin E 400 UNIT capsule Take 400 Units by mouth daily.   ezetimibe (ZETIA) 10 MG tablet Take 1 tablet (10 mg total) by mouth daily.   No facility-administered encounter medications on file as of 11/25/2021.    Allergies (verified) Codeine   History: Past Medical History:  Diagnosis Date   Arthritis    Borderline diabetes    Breast lump on right side at 3 o'clock position    Benign, fatty mass   History of kidney stones    History of shingles    Hyperlipidemia    Hypertension    Myocardial infarction ([place]) 1996    HAD ANGIOPLASTY WITH 2 STENTS   Tinnitus    right ear    Vitamin D deficiency 10/17/2019   Wears glasses    Past Surgical History:  Procedure Laterality Date   BREAST LUMPECTOMY  >10 YRS AGO   RT BREAST   COLONOSCOPY WITH PROPOFOL N/A 04/22/2021   Procedure: COLONOSCOPY WITH PROPOFOL;  Surgeon: Eloise Harman, DO;  [facility];  Service: Endoscopy;  Laterality: N/A;  11:45am   CORONARY ANGIOPLASTY  1996   ANGIOPLASTY / 2 STENTS   CYSTOSCOPY WITH STENT PLACEMENT Right 12/30/2015   Procedure: CYSTOSCOPY;  Surgeon: Cleon Gustin, MD;  Location: [facility];  Service: Urology;  Laterality: Right;   CYSTOSCOPY WITH STENT PLACEMENT Right 01/09/2016   Procedure: CYSTOSCOPY WITH STENT PLACEMENT;  Surgeon: Cleon Gustin, MD;  [facility]: [facility];  Service: Urology;  Laterality: Right;   HOLMIUM LASER APPLICATION Right 2/35/5732   Procedure: HOLMIUM LASER APPLICATION;  Surgeon: Cleon Gustin, MD;  [facility];  Service: Urology;   Laterality: Right;   LITHOTRIPSY     NEPHROLITHOTOMY Right 12/30/2015   Procedure: RIght Nephrostomy with access;  Surgeon: Cleon Gustin, MD;  [facility];  Service: Urology;  Laterality: Right;   NEPHROLITHOTOMY Right 01/09/2016   Procedure: RIGHT PERCUTANEOUS NEPHROLITHOTOMY SECOND LOOK , POSSIBLE DILATION OF TRACT;  Surgeon: Cleon Gustin, MD;  [facility];  Service: Urology;  Laterality: Right;   POLYPECTOMY  04/22/2021   Procedure: POLYPECTOMY;  Surgeon: Eloise Harman, DO;  [facility];  Service: Endoscopy;;   Family History  Problem Relation Age of Onset   Heart disease Mother    Diabetes Mother    Cancer Father        bone   Cirrhosis Father    Stroke Maternal Grandmother    Stroke Maternal Grandfather    Heart disease Sister    Obesity Brother    Healthy Daughter    Social History   Socioeconomic History   Marital status: Widowed    Spouse name: Not on file   Number of children: 1   Years of education: 6   Highest education level: Master's degree (e.g., MA, MS, MEng, MEd, MSW, MBA)  Occupational History   Occupation: Retired  Tobacco Use   Smoking status: Every Day    Packs/day: 1.00    Years: 40.00    Pack years: 40.00    Types: Cigarettes   Smokeless tobacco: Never  Vaping Use   Vaping Use: Never used  Substance and Sexual Activity   Alcohol use: No    Alcohol/week: 0.0 standard drinks   Drug use: No   Sexual activity: Yes  Other Topics Concern   Not on file  Social History Narrative   Single,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   Social Determinants of Health   Financial Resource Strain: Low Risk    Difficulty of Paying Living Expenses: Not hard at all  Food Insecurity: No Food Insecurity   Worried About Charity fundraiser in the Last Year: Never true   [place] in the Last Year: Never true  Transportation Needs: No Transportation Needs   Lack of Transportation (Medical): No   Lack of Transportation  (Non-Medical): No  Physical Activity: Sufficiently Active   Days of Exercise per Week: 5 days   Minutes of Exercise per Session: 30 min  Stress: No Stress Concern Present   Feeling of Stress : Not at all  Social Connections: Socially Isolated   Frequency of Communication with Friends and Family: More than three times a week   Frequency of Social Gatherings with Friends and Family: Once a week   Attends Religious Services: Never   Marine scientist or Organizations: No   Attends Archivist Meetings: Never   Marital Status: Widowed    Tobacco Counseling Ready to quit: No Counseling given: Not Answered  Clinical Intake:  Pre-visit preparation completed: No  Pain : No/denies pain     Nutritional Risks: None Diabetes: No  How often do you need to have someone help you when you read instructions, pamphlets, or other written materials from your doctor or pharmacy?: 1 - Never What is the last grade level you completed in school?: 12  Diabetic?no  Interpreter Needed?: No  Information entered by :: [facility] of Daily Living In your present state of health, do you have any difficulty performing the following activities: 11/25/2021 04/18/2021  Hearing? N N  Vision? N N  Difficulty concentrating or making decisions? N N  Walking or climbing stairs? N N  Dressing or bathing? N N  Doing errands, shopping? N N  Preparing Food and eating ? N -  Using the Toilet? N -  In the past six months, have you accidently leaked urine? N -  Do you have problems with loss of bowel control? N -  Managing your Medications? N -  Managing your Finances? N -  Housekeeping or managing your Housekeeping? N -  Some recent data might be hidden    Patient Care Team: Lindell Spar, MD as PCP - General (Internal Medicine) Harl Bowie Alphonse Guild, MD as PCP - Cardiology (Cardiology) Eloise Harman, DO as Consulting Physician (Internal Medicine)  Indicate any recent Medical  Services you may have received from other than [facility] providers in the past year (date may be approximate).     Assessment:   This is a routine wellness examination for Leah [name].  Hearing/Vision screen No results found.  Dietary issues and exercise activities discussed: Current Exercise Habits: Home exercise routine, Type of exercise: stretching;walking, Time (Minutes): 30, Frequency (Times/Week): 5, Weekly Exercise (Minutes/Week): 150, Intensity: Moderate   Goals Addressed   None   Depression Screen PHQ 2/9 Scores 11/25/2021 09/10/2021 12/11/2020 04/11/2020 10/17/2019  PHQ - 2 Score 0 0 0 0 0  PHQ- 9 Score - - 0 - -    Fall Risk Fall Risk  11/25/2021 09/10/2021 12/11/2020 04/11/2020 03/28/2020  Falls in the past year? 0 0 0 0 0  Number falls in past yr: 0 0 - 0 0  Injury with Fall? 0 0 - 0 0  Risk for fall due to : No Fall Risks No Fall Risks - - -  Follow up Falls evaluation completed Falls evaluation completed - - -    FALL RISK PREVENTION PERTAINING TO THE HOME:  Any stairs in or around the home? Yes  If so, are there any without handrails? Yes  Home free of loose throw rugs in walkways, pet beds, electrical cords, etc? No  Adequate lighting in your home to reduce risk of falls? Yes   ASSISTIVE DEVICES UTILIZED TO PREVENT FALLS:  Life alert? No  Use of a cane, walker or w/c? No  Grab bars in the bathroom? Yes  Shower chair or bench in shower? No  Elevated toilet seat or a handicapped toilet? No     Cognitive Function:     6CIT Screen 11/25/2021  What Year? 0 points  What month? 0 points  What time? 0 points  Count back from 20 0 points  Months in reverse 0 points  Repeat phrase 0 points  Total Score 0    Immunizations Immunization History  Administered Date(s) Administered   Fluad Quad(high Dose 65+) 10/17/2019, 09/04/2020, 09/10/2021   H1N1 10/10/2008   Influenza Whole 12/07/2006, 08/29/2008   Influenza,inj,Quad PF,6+ Mos 01/10/2016  PFIZER(Purple  Top)SARS-COV-2 Vaccination 02/29/2020, 03/22/2020, 09/27/2020, 03/07/2021   PNEUMOCOCCAL CONJUGATE-20 09/10/2021   Pneumococcal Polysaccharide-23 07/13/2008, 10/17/2019    TDAP status: Due, Education has been provided regarding the importance of this vaccine. Advised may receive this vaccine at local pharmacy or Health Dept. Aware to provide a copy of the vaccination record if obtained from local pharmacy or Health Dept. Verbalized acceptance and understanding.  Flu Vaccine status: Up to date  Pneumococcal vaccine status: Up to date  Covid-19 vaccine status: Information provided on how to obtain vaccines.   Qualifies for Shingles Vaccine? Yes   Zostavax completed No   Shingrix Completed?: No.    Education has been provided regarding the importance of this vaccine. Patient has been advised to call insurance company to determine out of pocket expense if they have not yet received this vaccine. Advised may also receive vaccine at local pharmacy or Health Dept. Verbalized acceptance and understanding.  Screening Tests Health Maintenance  Topic Date Due   TETANUS/TDAP  Never done   Zoster Vaccines- Shingrix (1 of 2) Never done   COVID-19 Vaccine (5 - Booster for Pfizer series) 05/02/2021   MAMMOGRAM  11/11/2022   COLONOSCOPY (Pts 45-39yrs Insurance coverage will need to be confirmed)  04/23/2031   Pneumonia Vaccine 27+ Years old  Completed   INFLUENZA VACCINE  Completed   DEXA SCAN  Completed   Hepatitis C Screening  Completed   HPV VACCINES  Aged Out    Health Maintenance  Health Maintenance Due  Topic Date Due   TETANUS/TDAP  Never done   Zoster Vaccines- Shingrix (1 of 2) Never done   COVID-19 Vaccine (5 - Booster for Pfizer series) 05/02/2021    Colorectal cancer screening: Type of screening: Colonoscopy. Completed 04/22/2021. Repeat every 10 years  Mammogram status: Completed 12/272021. Repeat every year  Bone Density status: Completed 11/26/2016. Results reflect: Bone  density results: OSTEOPENIA. Repeat every 2 years.  Lung Cancer Screening: (Low Dose CT Chest recommended if Age 2-80 years, 30 pack-year currently smoking OR have quit w/in 15years.) does not qualify.   Lung Cancer Screening Referral: n/a  Additional Screening:  Hepatitis C Screening: does qualify; Completed 10/17/2019  Vision Screening: Recommended annual ophthalmology exams for early detection of glaucoma and other disorders of the eye. Is the patient up to date with their annual eye exam?  No  Who is the provider or what is the name of the office in which the patient attends annual eye exams? [facility] If pt is not established with a provider, would they like to be referred to a provider to establish care? No .   Dental Screening: Recommended annual dental exams for proper oral hygiene  Community Resource Referral / Chronic Care Management: CRR required this visit?  No   CCM required this visit?  No      Plan:     I have personally reviewed and noted the following in the patients chart:   Medical and social history Use of alcohol, tobacco or illicit drugs  Current medications and supplements including opioid prescriptions.  Functional ability and status Nutritional status Physical activity Advanced directives List of other physicians Hospitalizations, surgeries, and ER visits in previous 12 months Vitals Screenings to include cognitive, depression, and falls Referrals and appointments  In addition, I have reviewed and discussed with patient certain preventive protocols, quality metrics, and best practice recommendations. A written personalized care plan for preventive services as well as general preventive health recommendations were provided to patient.  Leah [name], Leah [name]   11/25/2021   Nurse Notes:  Leah [name] , Thank you for taking time to come for your Medicare Wellness Visit. I appreciate your ongoing commitment to your health goals. Please review  the following plan we discussed and let me know if I can assist you in the future.   These are the goals we discussed:  Goals   None     This is a list of the screening recommended for you and due dates:  Health Maintenance  Topic Date Due   Tetanus Vaccine  Never done   Zoster (Shingles) Vaccine (1 of 2) Never done   COVID-19 Vaccine (5 - Booster for Pfizer series) 05/02/2021   Mammogram  11/11/2022   Colon Cancer Screening  04/23/2031   Pneumonia Vaccine  Completed   Flu Shot  Completed   DEXA scan (bone density measurement)  Completed   Hepatitis C Screening: USPSTF Recommendation to screen - Ages 47-79 yo.  Completed   HPV Vaccine  Aged Out

## 2021-11-25 NOTE — Addendum Note (Signed)
Addended by: Argentina Ponder D on: 11/25/2021 10:02 AM   Modules accepted: Orders, Level of Service

## 2021-12-17 ENCOUNTER — Encounter (INDEPENDENT_AMBULATORY_CARE_PROVIDER_SITE_OTHER): Payer: PPO | Admitting: Nurse Practitioner

## 2022-01-01 ENCOUNTER — Telehealth: Payer: Self-pay | Admitting: Internal Medicine

## 2022-01-01 ENCOUNTER — Other Ambulatory Visit: Payer: Self-pay | Admitting: *Deleted

## 2022-01-01 DIAGNOSIS — I2510 Atherosclerotic heart disease of native coronary artery without angina pectoris: Secondary | ICD-10-CM

## 2022-01-01 DIAGNOSIS — I10 Essential (primary) hypertension: Secondary | ICD-10-CM

## 2022-01-01 MED ORDER — METOPROLOL TARTRATE 50 MG PO TABS: 50.0000 mg | ORAL_TABLET | Freq: Every day | ORAL | 1 refills | Status: DC

## 2022-01-01 MED ORDER — AMLODIPINE BESYLATE 10 MG PO TABS: 10.0000 mg | ORAL_TABLET | Freq: Every day | ORAL | 1 refills | Status: DC

## 2022-01-01 NOTE — Telephone Encounter (Signed)
Medication sent to pharmacy  

## 2022-01-01 NOTE — Telephone Encounter (Signed)
Pt called in for refills   amLODipine (NORVASC) 10 MG tablet    metoprolol tartrate (LOPRESSOR) 50 MG tablet    [facility]

## 2022-01-07 ENCOUNTER — Telehealth: Payer: Self-pay | Admitting: Internal Medicine

## 2022-01-07 ENCOUNTER — Other Ambulatory Visit: Payer: Self-pay | Admitting: *Deleted

## 2022-01-07 DIAGNOSIS — I10 Essential (primary) hypertension: Secondary | ICD-10-CM

## 2022-01-07 MED ORDER — HYDROCHLOROTHIAZIDE 12.5 MG PO TABS: 12.5000 mg | ORAL_TABLET | Freq: Every day | ORAL | 1 refills | Status: DC

## 2022-01-07 NOTE — Telephone Encounter (Signed)
Medication sent to pharmacy  

## 2022-01-07 NOTE — Telephone Encounter (Signed)
Please send in to [facility]   hydrochlorothiazide hydrochlorothiazide (HYDRODIURIL) 12.5 MG tablet

## 2022-01-19 ENCOUNTER — Other Ambulatory Visit (INDEPENDENT_AMBULATORY_CARE_PROVIDER_SITE_OTHER): Payer: Self-pay | Admitting: Nurse Practitioner

## 2022-01-19 DIAGNOSIS — I2510 Atherosclerotic heart disease of native coronary artery without angina pectoris: Secondary | ICD-10-CM

## 2022-01-19 DIAGNOSIS — I10 Essential (primary) hypertension: Secondary | ICD-10-CM

## 2022-01-20 ENCOUNTER — Other Ambulatory Visit: Payer: Self-pay | Admitting: *Deleted

## 2022-01-20 ENCOUNTER — Telehealth: Payer: Self-pay | Admitting: Internal Medicine

## 2022-01-20 DIAGNOSIS — I10 Essential (primary) hypertension: Secondary | ICD-10-CM

## 2022-01-20 DIAGNOSIS — I2510 Atherosclerotic heart disease of native coronary artery without angina pectoris: Secondary | ICD-10-CM

## 2022-01-20 MED ORDER — ATORVASTATIN CALCIUM 80 MG PO TABS: 80.0000 mg | ORAL_TABLET | Freq: Every day | ORAL | 1 refills | Status: DC

## 2022-01-20 NOTE — Telephone Encounter (Signed)
Medication sent to pharmacy  

## 2022-01-20 NOTE — Telephone Encounter (Signed)
Atorvastin '80mg'$  please send refill to [place]  ?

## 2022-01-27 ENCOUNTER — Other Ambulatory Visit: Payer: Self-pay | Admitting: Internal Medicine

## 2022-01-27 DIAGNOSIS — I10 Essential (primary) hypertension: Secondary | ICD-10-CM

## 2022-02-12 ENCOUNTER — Other Ambulatory Visit: Payer: Self-pay | Admitting: Physician Assistant

## 2022-03-12 ENCOUNTER — Encounter: Payer: Self-pay | Admitting: Internal Medicine

## 2022-03-12 ENCOUNTER — Ambulatory Visit (INDEPENDENT_AMBULATORY_CARE_PROVIDER_SITE_OTHER): Payer: PPO | Admitting: Internal Medicine

## 2022-03-12 ENCOUNTER — Other Ambulatory Visit: Payer: Self-pay | Admitting: *Deleted

## 2022-03-12 VITALS — BP 138/78 | HR 94 | Resp 18 | Ht 63.0 in | Wt 173.8 lb

## 2022-03-12 DIAGNOSIS — J439 Emphysema, unspecified: Secondary | ICD-10-CM | POA: Diagnosis not present

## 2022-03-12 DIAGNOSIS — E559 Vitamin D deficiency, unspecified: Secondary | ICD-10-CM | POA: Diagnosis not present

## 2022-03-12 DIAGNOSIS — I10 Essential (primary) hypertension: Secondary | ICD-10-CM

## 2022-03-12 DIAGNOSIS — Z122 Encounter for screening for malignant neoplasm of respiratory organs: Secondary | ICD-10-CM | POA: Diagnosis not present

## 2022-03-12 DIAGNOSIS — Z0001 Encounter for general adult medical examination with abnormal findings: Secondary | ICD-10-CM | POA: Diagnosis not present

## 2022-03-12 DIAGNOSIS — Z720 Tobacco use: Secondary | ICD-10-CM

## 2022-03-12 DIAGNOSIS — I700 Atherosclerosis of aorta: Secondary | ICD-10-CM | POA: Diagnosis not present

## 2022-03-12 DIAGNOSIS — E785 Hyperlipidemia, unspecified: Secondary | ICD-10-CM | POA: Diagnosis not present

## 2022-03-12 DIAGNOSIS — R7303 Prediabetes: Secondary | ICD-10-CM | POA: Diagnosis not present

## 2022-03-12 MED ORDER — EZETIMIBE 10 MG PO TABS: 10.0000 mg | ORAL_TABLET | Freq: Every day | ORAL | 0 refills | Status: DC

## 2022-03-12 MED ORDER — OLMESARTAN MEDOXOMIL 20 MG PO TABS: ORAL_TABLET | ORAL | 0 refills | Status: DC

## 2022-03-12 NOTE — Patient Instructions (Addendum)
Please continue taking medications as prescribed. ? ?Please continue to follow low salt diet and ambulate as tolerated. ? ?Please try to cut down -> quit smoking. ? ?You are being scheduled to get low dose CT chest. ? ?Please consider getting Shingrix and TDaP vaccines at your local pharmacy. ?

## 2022-03-12 NOTE — Assessment & Plan Note (Signed)
Physical exam as documented. ?Fasting blood tests ordered. ?Advised to get Shingrix and Tdap vaccine at local pharmacy. ?

## 2022-03-12 NOTE — Assessment & Plan Note (Signed)
BP Readings from Last 1 Encounters:  ?03/12/22 138/78  ? ?Well-controlled with amlodipine, HCTZ and Olmesartan ?Counseled for compliance with the medications ?Advised DASH diet and moderate exercise/walking, at least 150 mins/week ?

## 2022-03-12 NOTE — Progress Notes (Signed)
? ?Established Patient Office Visit ? ?Subjective:  ?Patient ID: Leah [name], female    DOB: October 24, 1956  Age: 66 y.o. MRN: [id number] ? ?CC:  ?Chief Complaint  ?Patient presents with  ? Annual Exam  ?  Annual exam   ? ? ?HPI ?Leah [name] is a 66 y.o. female with past medical history of CAD s/p stent placement, HTN, emphysema, HLD, OA and tobacco abuse who presents for annual physical. ? ?CAD: She has had stent placed to RCA in 1998.  She denies any anginal pain, dyspnea or palpitations currently.  She is on aspirin, statin, Zetia and metoprolol currently.  She follows up with cardiology. BP is well-controlled. Takes medications regularly. Patient denies headache, dizziness, chest pain, dyspnea or palpitations. ?  ?She smokes about a pack per day.  She had low dose CT chest in 2021, which showed emphysema and aortic atherosclerosis.  She denies any dyspnea or wheezing currently.  She is willing to cut down smoking for now. ?  ?She takes vitamin D, vitamin B12 and vitamin D supplements. ?  ?She also reports history of prediabetes.  Denies any fatigue, polyuria or polydipsia. ? ? ? ?Past Medical History:  ?Diagnosis Date  ? Arthritis   ? Borderline diabetes   ? Breast lump on right side at 3 o'clock position   ? Benign, fatty mass  ? History of kidney stones   ? History of shingles   ? Hyperlipidemia   ? Hypertension   ? Myocardial infarction [facility]) 1996  ?  HAD ANGIOPLASTY WITH 2 STENTS  ? Tinnitus   ? right ear   ? Vitamin D deficiency 10/17/2019  ? Wears glasses   ? ? ?Past Surgical History:  ?Procedure Laterality Date  ? BREAST LUMPECTOMY  >10 YRS AGO  ? RT BREAST  ? COLONOSCOPY WITH PROPOFOL N/A 04/22/2021  ? Procedure: COLONOSCOPY WITH PROPOFOL;  Surgeon: Eloise Harman, DO;  [facility];  Service: Endoscopy;  Laterality: N/A;  11:45am  ? CORONARY ANGIOPLASTY  1996  ? ANGIOPLASTY / 2 STENTS  ? CYSTOSCOPY WITH STENT PLACEMENT Right 12/30/2015  ? Procedure: CYSTOSCOPY;  Surgeon: Cleon Gustin,  MD;  [facility];  Service: Urology;  Laterality: Right;  ? CYSTOSCOPY WITH STENT PLACEMENT Right 01/09/2016  ? Procedure: CYSTOSCOPY WITH STENT PLACEMENT;  Surgeon: Cleon Gustin, MD;  [facility];  Service: Urology;  Laterality: Right;  ? HOLMIUM LASER APPLICATION Right 0/22/3361  ? Procedure: HOLMIUM LASER APPLICATION;  Surgeon: Cleon Gustin, MD;  [facility];  Service: Urology;  Laterality: Right;  ? LITHOTRIPSY    ? NEPHROLITHOTOMY Right 12/30/2015  ? Procedure: RIght Nephrostomy with access;  Surgeon: Cleon Gustin, MD;  [facility];  Service: Urology;  Laterality: Right;  ? NEPHROLITHOTOMY Right 01/09/2016  ? Procedure: RIGHT PERCUTANEOUS NEPHROLITHOTOMY SECOND LOOK , POSSIBLE DILATION OF TRACT;  Surgeon: Cleon Gustin, MD;  [facility];  Service: Urology;  Laterality: Right;  ? POLYPECTOMY  04/22/2021  ? Procedure: POLYPECTOMY;  Surgeon: Eloise Harman, DO;  [facility];  Service: Endoscopy;;  ? ? ?Family History  ?Problem Relation Age of Onset  ? Heart disease Mother   ? Diabetes Mother   ? Cancer Father   ?     bone  ? Cirrhosis Father   ? Stroke Maternal Grandmother   ? Stroke Maternal Grandfather   ? Heart disease Sister   ? Obesity Brother   ? Healthy Daughter   ? ? ?  Social History  ? ?Socioeconomic History  ? Marital status: Widowed  ?  Spouse name: Not on file  ? Number of children: 1  ? Years of education: 79  ? Highest education level: Master's degree (e.g., MA, MS, MEng, MEd, MSW, MBA)  ?Occupational History  ? Occupation: Retired  ?Tobacco Use  ? Smoking status: Every Day  ?  Packs/day: 1.00  ?  Years: 40.00  ?  Pack years: 40.00  ?  Types: Cigarettes  ? Smokeless tobacco: Never  ?Vaping Use  ? Vaping Use: Never used  ?Substance and Sexual Activity  ? Alcohol use: No  ?  Alcohol/week: 0.0 standard drinks  ? Drug use: No  ? Sexual activity: Yes  ?Other Topics Concern  ? Not on file  ?Social History Narrative  ? 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  ? ?Social Determinants of Health  ? ?Financial Resource Strain: Low Risk   ? Difficulty of Paying Living Expenses: Not hard at all  ?Food Insecurity: No Food Insecurity  ? Worried About Charity fundraiser in the Last Year: Never true  ? Ran Out of Food in the Last Year: Never true  ?Transportation Needs: No Transportation Needs  ? Lack of Transportation (Medical): No  ? Lack of Transportation (Non-Medical): No  ?Physical Activity: Sufficiently Active  ? Days of Exercise per Week: 5 days  ? Minutes of Exercise per Session: 30 min  ?Stress: No Stress Concern Present  ? Feeling of Stress : Not at all  ?Social Connections: Socially Isolated  ? Frequency of Communication with Friends and Family: More than three times a week  ? Frequency of Social Gatherings with Friends and Family: Once a week  ? Attends Religious Services: Never  ? Active Member of Clubs or Organizations: No  ? Attends Archivist Meetings: Never  ? Marital Status: Widowed  ?Intimate Partner Violence: Not At Risk  ? Fear of Current or Ex-Partner: No  ? Emotionally Abused: No  ? Physically Abused: No  ? Sexually Abused: No  ? ? ?Outpatient Medications Prior to Visit  ?Medication Sig Dispense Refill  ? amLODipine (NORVASC) 10 MG tablet Take 1 tablet (10 mg total) by mouth daily. 90 tablet 1  ? aspirin EC 81 MG tablet Take 81 mg by mouth every morning.     ? atorvastatin (LIPITOR) 80 MG tablet Take 1 tablet (80 mg total) by mouth daily. 90 tablet 1  ? Blood Pressure Monitoring (BLOOD PRESSURE MONITOR AUTOMAT) DEVI 1 Device by Does not apply route daily. Check blood pressure daily 1 each 0  ? Ergocalciferol (VITAMIN D2) 50 MCG (2000 UT) TABS Take 2,000 Units by mouth daily.    ? hydrochlorothiazide (HYDRODIURIL) 12.5 MG tablet Take 1 tablet (12.5 mg total) by mouth daily. 90 tablet 1  ? ibuprofen (ADVIL) 200 MG tablet Take 400 mg by mouth every 6 (six) hours as needed for headache or moderate pain.    ? metoprolol  tartrate (LOPRESSOR) 50 MG tablet Take 1 tablet (50 mg total) by mouth daily. 90 tablet 1  ? Multiple Vitamin (MULTIVITAMIN WITH MINERALS) TABS tablet Take 1 tablet by mouth every morning.    ? Omega 3 1000 MG CAPS Take 1,000 mg by mouth daily.    ? vitamin B-12 (CYANOCOBALAMIN) 500 MCG tablet Take 500 mcg by mouth daily.    ? vitamin E 400 UNIT capsule Take 400 Units by mouth daily.    ? ezetimibe (ZETIA) 10 MG tablet TAKE ONE TABLET BY MOUTH ONCE  DAILY. 90 tablet 0  ? olmesartan (BENICAR) 20 MG tablet TAKE (1) TABLET BY MOUTH ONCE DAILY. 90 tablet 0  ? ?No facility-administered medications prior to visit.  ? ? ?Allergies  ?Allergen Reactions  ? Codeine   ?  REACTION: " Makes me high and blacked out in the 1980s"  ? ? ?ROS ?Review of Systems  ?Constitutional:  Negative for chills and fever.  ?HENT:  Negative for congestion, sinus pressure, sinus pain and sore throat.   ?Eyes:  Negative for pain and discharge.  ?Respiratory:  Negative for cough and shortness of breath.   ?Cardiovascular:  Negative for chest pain and palpitations.  ?Gastrointestinal:  Negative for abdominal pain, constipation, diarrhea, nausea and vomiting.  ?Endocrine: Negative for polydipsia and polyuria.  ?Genitourinary:  Negative for dysuria and hematuria.  ?Musculoskeletal:  Negative for neck pain and neck stiffness.  ?Skin:  Negative for rash.  ?Neurological:  Negative for dizziness and weakness.  ?Psychiatric/Behavioral:  Negative for agitation and behavioral problems.   ? ?  ?Objective:  ?  ?Physical Exam ?Vitals reviewed.  ?Constitutional:   ?   General: She is not in acute distress. ?   Appearance: She is not diaphoretic.  ?HENT:  ?   Head: Normocephalic and atraumatic.  ?   Nose: Nose normal.  ?   Mouth/Throat:  ?   Mouth: Mucous membranes are moist.  ?Eyes:  ?   General: No scleral icterus. ?   Extraocular Movements: Extraocular movements intact.  ?Neck:  ?   Vascular: No carotid bruit.  ?Cardiovascular:  ?   Rate and Rhythm: Normal rate  and regular rhythm.  ?   Pulses: Normal pulses.  ?   Heart sounds: Normal heart sounds. No murmur heard. ?Pulmonary:  ?   Breath sounds: Normal breath sounds. No wheezing or rales.  ?Abdominal:  ?   Palpations: Abdomen is soft

## 2022-03-12 NOTE — Assessment & Plan Note (Signed)
Noted on CT chest Asymptomatic currently Advised to cut down -> quit smoking 

## 2022-03-12 NOTE — Assessment & Plan Note (Signed)
Smokes about 1 pack/day X >45 years ? ?Asked about quitting: confirms that she currently smokes cigarettes ?Advise to quit smoking: Educated about QUITTING to reduce the risk of cancer, cardio and cerebrovascular disease. ?Assess willingness: Unwilling to quit at this time, but is working on cutting back. ?Assist with counseling and pharmacotherapy: Counseled for 5 minutes and literature provided. ?Arrange for follow up: Follow up in 3 months and continue to offer help. ?

## 2022-03-12 NOTE — Assessment & Plan Note (Signed)
Noted on CT chest On aspirin and statin 

## 2022-03-13 LAB — CMP14+EGFR
ALT: 24 IU/L (ref 0–32)
AST: 32 IU/L (ref 0–40)
Albumin/Globulin Ratio: 1.5 (ref 1.2–2.2)
Albumin: 4.3 g/dL (ref 3.8–4.8)
Alkaline Phosphatase: 153 IU/L — ABNORMAL HIGH (ref 44–121)
BUN/Creatinine Ratio: 9 — ABNORMAL LOW (ref 12–28)
BUN: 11 mg/dL (ref 8–27)
Bilirubin Total: 0.3 mg/dL (ref 0.0–1.2)
CO2: 23 mmol/L (ref 20–29)
Calcium: 9.5 mg/dL (ref 8.7–10.3)
Chloride: 97 mmol/L (ref 96–106)
Creatinine, Ser: 1.24 mg/dL — ABNORMAL HIGH (ref 0.57–1.00)
Globulin, Total: 2.9 g/dL (ref 1.5–4.5)
Glucose: 101 mg/dL — ABNORMAL HIGH (ref 70–99)
Potassium: 4 mmol/L (ref 3.5–5.2)
Sodium: 137 mmol/L (ref 134–144)
Total Protein: 7.2 g/dL (ref 6.0–8.5)
eGFR: 48 mL/min/{1.73_m2} — ABNORMAL LOW (ref >59)

## 2022-03-13 LAB — CBC WITH DIFFERENTIAL/PLATELET
Basophils Absolute: 0.1 10*3/uL (ref 0.0–0.2)
Basos: 1 %
EOS (ABSOLUTE): 0.1 10*3/uL (ref 0.0–0.4)
Eos: 1 %
Hematocrit: 44.2 % (ref 34.0–46.6)
Hemoglobin: 14.6 g/dL (ref 11.1–15.9)
Immature Grans (Abs): 0 10*3/uL (ref 0.0–0.1)
Immature Granulocytes: 0 %
Lymphocytes Absolute: 2.3 10*3/uL (ref 0.7–3.1)
Lymphs: 39 %
MCH: 29 pg (ref 26.6–33.0)
MCHC: 33 g/dL (ref 31.5–35.7)
MCV: 88 fL (ref 79–97)
Monocytes Absolute: 0.4 10*3/uL (ref 0.1–0.9)
Monocytes: 7 %
Neutrophils Absolute: 3.1 10*3/uL (ref 1.4–7.0)
Neutrophils: 52 %
Platelets: 212 10*3/uL (ref 150–450)
RBC: 5.03 x10E6/uL (ref 3.77–5.28)
RDW: 14.1 % (ref 11.7–15.4)
WBC: 5.9 10*3/uL (ref 3.4–10.8)

## 2022-03-13 LAB — LIPID PANEL
Chol/HDL Ratio: 2.7 ratio (ref 0.0–4.4)
Cholesterol, Total: 132 mg/dL (ref 100–199)
HDL: 49 mg/dL (ref >39)
LDL Chol Calc (NIH): 70 mg/dL (ref 0–99)
Triglycerides: 64 mg/dL (ref 0–149)
VLDL Cholesterol Cal: 13 mg/dL (ref 5–40)

## 2022-03-13 LAB — HEMOGLOBIN A1C
Est. average glucose Bld gHb Est-mCnc: 131 mg/dL
Hgb A1c MFr Bld: 6.2 % — ABNORMAL HIGH (ref 4.8–5.6)

## 2022-03-13 LAB — VITAMIN D 25 HYDROXY (VIT D DEFICIENCY, FRACTURES): Vit D, 25-Hydroxy: 66.6 ng/mL (ref 30.0–100.0)

## 2022-03-13 LAB — TSH: TSH: 2.66 u[IU]/mL (ref 0.450–4.500)

## 2022-04-06 ENCOUNTER — Other Ambulatory Visit: Payer: Self-pay | Admitting: Internal Medicine

## 2022-04-06 ENCOUNTER — Other Ambulatory Visit: Payer: Self-pay | Admitting: Nurse Practitioner

## 2022-04-06 DIAGNOSIS — I10 Essential (primary) hypertension: Secondary | ICD-10-CM

## 2022-04-06 DIAGNOSIS — I2510 Atherosclerotic heart disease of native coronary artery without angina pectoris: Secondary | ICD-10-CM

## 2022-04-29 ENCOUNTER — Ambulatory Visit (HOSPITAL_COMMUNITY)
Admission: RE | Admit: 2022-04-29 | Discharge: 2022-04-29 | Disposition: A | Discharge status: Home / Self Care | Payer: PPO | Source: Ambulatory Visit | Attending: Internal Medicine | Admitting: Internal Medicine

## 2022-04-29 DIAGNOSIS — J439 Emphysema, unspecified: Secondary | ICD-10-CM | POA: Diagnosis present

## 2022-04-29 DIAGNOSIS — Z720 Tobacco use: Secondary | ICD-10-CM | POA: Diagnosis present

## 2022-04-29 DIAGNOSIS — I700 Atherosclerosis of aorta: Secondary | ICD-10-CM | POA: Insufficient documentation

## 2022-04-29 DIAGNOSIS — Z122 Encounter for screening for malignant neoplasm of respiratory organs: Secondary | ICD-10-CM | POA: Insufficient documentation

## 2022-04-29 DIAGNOSIS — I2510 Atherosclerotic heart disease of native coronary artery without angina pectoris: Secondary | ICD-10-CM | POA: Diagnosis not present

## 2022-04-29 DIAGNOSIS — J432 Centrilobular emphysema: Secondary | ICD-10-CM | POA: Insufficient documentation

## 2022-04-29 DIAGNOSIS — N200 Calculus of kidney: Secondary | ICD-10-CM | POA: Diagnosis not present

## 2022-04-29 DIAGNOSIS — J438 Other emphysema: Secondary | ICD-10-CM | POA: Diagnosis not present

## 2022-04-29 DIAGNOSIS — F17210 Nicotine dependence, cigarettes, uncomplicated: Secondary | ICD-10-CM | POA: Insufficient documentation

## 2022-05-05 ENCOUNTER — Telehealth: Payer: Self-pay | Admitting: Internal Medicine

## 2022-05-05 NOTE — Telephone Encounter (Signed)
Pt called back stating she was returning a call?

## 2022-05-06 NOTE — Telephone Encounter (Signed)
Pt advised of lab results with verbal understanding  

## 2022-05-13 ENCOUNTER — Ambulatory Visit (INDEPENDENT_AMBULATORY_CARE_PROVIDER_SITE_OTHER): Payer: PPO | Admitting: Internal Medicine

## 2022-05-13 ENCOUNTER — Encounter: Payer: Self-pay | Admitting: Internal Medicine

## 2022-05-13 VITALS — BP 138/80 | HR 88 | Resp 18 | Ht 63.0 in | Wt 170.8 lb

## 2022-05-13 DIAGNOSIS — H6123 Impacted cerumen, bilateral: Secondary | ICD-10-CM | POA: Diagnosis not present

## 2022-05-13 NOTE — Patient Instructions (Signed)
Please use Debrox ear drop for excess ear wax. Okay to use Qtip to clean ears after using Debrox.  Please avoid using any sharp objects for cleaning purposes.

## 2022-05-13 NOTE — Progress Notes (Signed)
Acute Office Visit  Subjective:    Patient ID: Leah [name], female    DOB: 11/01/1956, 66 y.o.   MRN: [id number]  Chief Complaint  Patient presents with   Acute Visit    Pt was cleaning her ears 05-10-22 with q tip ever since she cleaned her ear that day she cannot hear out of left ear doesn't hurt just cant hear     HPI Patient is in today for c/o left ear fullness and difficulty hearing for the last 3 days.  She tried using Q-tip to clean her left ear, but has had worsening of hearing since then.  She denies any ear discharge, but does report mild left-sided ear pain, which improved with ibuprofen.  She denies any dizziness, but has chronic right-sided tinnitus.  Denies any fever, chills, sore throat, nasal congestion, dyspnea or wheezing.  Past Medical History:  Diagnosis Date   Arthritis    Borderline diabetes    Breast lump on right side at 3 o'clock position    Benign, fatty mass   History of kidney stones    History of shingles    Hyperlipidemia    Hypertension    Myocardial infarction ([place]) 1996    HAD ANGIOPLASTY WITH 2 STENTS   Tinnitus    right ear    Vitamin D deficiency 10/17/2019   Wears glasses     Past Surgical History:  Procedure Laterality Date   BREAST LUMPECTOMY  >10 YRS AGO   RT BREAST   COLONOSCOPY WITH PROPOFOL N/A 04/22/2021   Procedure: COLONOSCOPY WITH PROPOFOL;  Surgeon: Eloise Harman, DO;  [facility];  Service: Endoscopy;  Laterality: N/A;  11:45am   CORONARY ANGIOPLASTY  1996   ANGIOPLASTY / 2 STENTS   CYSTOSCOPY WITH STENT PLACEMENT Right 12/30/2015   Procedure: CYSTOSCOPY;  Surgeon: Cleon Gustin, MD;  [facility];  Service: Urology;  Laterality: Right;   CYSTOSCOPY WITH STENT PLACEMENT Right 01/09/2016   Procedure: CYSTOSCOPY WITH STENT PLACEMENT;  Surgeon: Cleon Gustin, MD;  [facility];  Service: Urology;  Laterality: Right;   HOLMIUM LASER APPLICATION Right 8/67/6195   Procedure: HOLMIUM LASER  APPLICATION;  Surgeon: Cleon Gustin, MD;  [facility];  Service: Urology;  Laterality: Right;   LITHOTRIPSY     NEPHROLITHOTOMY Right 12/30/2015   Procedure: RIght Nephrostomy with access;  Surgeon: Cleon Gustin, MD;  [facility];  Service: Urology;  Laterality: Right;   NEPHROLITHOTOMY Right 01/09/2016   Procedure: RIGHT PERCUTANEOUS NEPHROLITHOTOMY SECOND LOOK , POSSIBLE DILATION OF TRACT;  Surgeon: Cleon Gustin, MD;  [facility];  Service: Urology;  Laterality: Right;   POLYPECTOMY  04/22/2021   Procedure: POLYPECTOMY;  Surgeon: Eloise Harman, DO;  [facility];  Service: Endoscopy;;    Family History  Problem Relation Age of Onset   Heart disease Mother    Diabetes Mother    Cancer Father        bone   Cirrhosis Father    Stroke Maternal Grandmother    Stroke Maternal Grandfather    Heart disease Sister    Obesity Brother    Healthy Daughter     Social History   Socioeconomic History   Marital status: Widowed    Spouse name: Not on file   Number of children: 1   Years of education: 12   Highest education level: Master's degree (e.g., MA, MS, MEng, MEd, MSW, MBA)  Occupational History  Occupation: Retired  Tobacco Use   Smoking status: Every Day    Packs/day: 1.00    Years: 40.00    Total pack years: 40.00    Types: Cigarettes   Smokeless tobacco: Never  Vaping Use   Vaping Use: Never used  Substance and Sexual Activity   Alcohol use: No    Alcohol/week: 0.0 standard drinks of alcohol   Drug use: No   Sexual activity: Yes  Other Topics Concern   Not on file  Social History Narrative   Single,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   Social Determinants of Health   Financial Resource Strain: Low Risk  (11/25/2021)   Overall Financial Resource Strain (CARDIA)    Difficulty of Paying Living Expenses: Not hard at all  Food Insecurity: No Food Insecurity (11/25/2021)   Hunger Vital Sign    Worried About Running Out of Food  in the Last Year: Never true    Ran Out of Food in the Last Year: Never true  Transportation Needs: No Transportation Needs (11/25/2021)   PRAPARE - Hydrologist (Medical): No    Lack of Transportation (Non-Medical): No  Physical Activity: Sufficiently Active (11/25/2021)   Exercise Vital Sign    Days of Exercise per Week: 5 days    Minutes of Exercise per Session: 30 min  Stress: No Stress Concern Present (11/25/2021)   [place]    Feeling of Stress : Not at all  Social Connections: Socially Isolated (11/25/2021)   Social Connection and Isolation Panel [NHANES]    Frequency of Communication with Friends and Family: More than three times a week    Frequency of Social Gatherings with Friends and Family: Once a week    Attends Religious Services: Never    Marine scientist or Organizations: No    Attends Archivist Meetings: Never    Marital Status: Widowed  Intimate Partner Violence: Not At Risk (11/25/2021)   Humiliation, Afraid, Rape, and Kick questionnaire    Fear of Current or Ex-Partner: No    Emotionally Abused: No    Physically Abused: No    Sexually Abused: No    Outpatient Medications Prior to Visit  Medication Sig Dispense Refill   amLODipine (NORVASC) 10 MG tablet Take 1 tablet (10 mg total) by mouth daily. 90 tablet 1   aspirin EC 81 MG tablet Take 81 mg by mouth every morning.      atorvastatin (LIPITOR) 80 MG tablet Take 1 tablet (80 mg total) by mouth daily. 90 tablet 1   Blood Pressure Monitoring (BLOOD PRESSURE MONITOR AUTOMAT) DEVI 1 Device by Does not apply route daily. Check blood pressure daily 1 each 0   Ergocalciferol (VITAMIN D2) 50 MCG (2000 UT) TABS Take 2,000 Units by mouth daily.     ezetimibe (ZETIA) 10 MG tablet Take 1 tablet (10 mg total) by mouth daily. 90 tablet 0   hydrochlorothiazide (HYDRODIURIL) 12.5 MG tablet Take 1 tablet (12.5 mg total)  by mouth daily. 90 tablet 1   ibuprofen (ADVIL) 200 MG tablet Take 400 mg by mouth every 6 (six) hours as needed for headache or moderate pain.     metoprolol tartrate (LOPRESSOR) 50 MG tablet TAKE 1 TABLET BY MOUTH ONCE A DAY. 90 tablet 0   Multiple Vitamin (MULTIVITAMIN WITH MINERALS) TABS tablet Take 1 tablet by mouth every morning.     olmesartan (BENICAR) 20 MG tablet TAKE (1) TABLET BY MOUTH ONCE DAILY.  90 tablet 0   Omega 3 1000 MG CAPS Take 1,000 mg by mouth daily.     vitamin B-12 (CYANOCOBALAMIN) 500 MCG tablet Take 500 mcg by mouth daily.     vitamin E 400 UNIT capsule Take 400 Units by mouth daily.     No facility-administered medications prior to visit.    Allergies  Allergen Reactions   Codeine     REACTION: " Makes me high and blacked out in the 1980s"    Review of Systems  Constitutional:  Negative for chills and fever.  HENT:  Negative for congestion, sinus pressure, sinus pain and sore throat.        Hearing problem  Eyes:  Negative for pain and discharge.  Respiratory:  Negative for cough and shortness of breath.   Cardiovascular:  Negative for chest pain and palpitations.  Gastrointestinal:  Negative for abdominal pain, constipation, diarrhea, nausea and vomiting.  Endocrine: Negative for polydipsia and polyuria.  Genitourinary:  Negative for dysuria and hematuria.  Musculoskeletal:  Negative for neck pain and neck stiffness.  Skin:  Negative for rash.  Neurological:  Negative for dizziness and weakness.  Psychiatric/Behavioral:  Negative for agitation and behavioral problems.        Objective:    Physical Exam Vitals reviewed.  Constitutional:      General: She is not in acute distress.    Appearance: She is not diaphoretic.  HENT:     Head: Normocephalic and atraumatic.     Right Ear: There is impacted cerumen.     Left Ear: There is impacted cerumen.     Nose: Nose normal.     Mouth/Throat:     Mouth: Mucous membranes are moist.  Eyes:      General: No scleral icterus.    Extraocular Movements: Extraocular movements intact.  Neck:     Vascular: No carotid bruit.  Cardiovascular:     Rate and Rhythm: Normal rate and regular rhythm.     Pulses: Normal pulses.     Heart sounds: Normal heart sounds. No murmur heard. Pulmonary:     Breath sounds: Normal breath sounds. No wheezing or rales.  Musculoskeletal:     Cervical back: Neck supple. No tenderness.     Right lower leg: No edema.     Left lower leg: No edema.  Skin:    General: Skin is warm.     Findings: No rash.  Neurological:     General: No focal deficit present.     Mental Status: She is alert and oriented to person, place, and time.  Psychiatric:        Mood and Affect: Mood normal.        Behavior: Behavior normal.     BP 138/80 (BP Location: Right Arm, Patient Position: Sitting, Cuff Size: Normal)   Pulse 88   Resp 18   Ht '5\' 3"'$  (1.6 m)   Wt 170 lb 12.8 oz (77.5 kg)   LMP 05/16/2002   SpO2 100%   BMI 30.26 kg/m  Wt Readings from Last 3 Encounters:  05/13/22 170 lb 12.8 oz (77.5 kg)  03/12/22 173 lb 12.8 oz (78.8 kg)  09/10/21 165 lb 0.6 oz (74.9 kg)        Assessment & Plan:   Problem List Items Addressed This Visit    Visit Diagnoses     Excessive ear wax, bilateral    -  Primary B/l ear irrigation Advised to use Debrox ear drops for excess ear  wax No signs of infection Avoid using any sharp objects for cleaning purposes        No orders of the defined types were placed in this encounter.    Lindell Spar, MD

## 2022-06-16 ENCOUNTER — Ambulatory Visit (INDEPENDENT_AMBULATORY_CARE_PROVIDER_SITE_OTHER): Payer: PPO | Admitting: Internal Medicine

## 2022-06-16 ENCOUNTER — Encounter: Payer: Self-pay | Admitting: Internal Medicine

## 2022-06-16 DIAGNOSIS — J439 Emphysema, unspecified: Secondary | ICD-10-CM | POA: Diagnosis not present

## 2022-06-16 DIAGNOSIS — J0110 Acute frontal sinusitis, unspecified: Secondary | ICD-10-CM | POA: Diagnosis not present

## 2022-06-16 MED ORDER — ALBUTEROL SULFATE HFA 108 (90 BASE) MCG/ACT IN AERS: 2.0000 | INHALATION_SPRAY | Freq: Four times a day (QID) | RESPIRATORY_TRACT | 0 refills | Status: DC | PRN

## 2022-06-16 MED ORDER — AMOXICILLIN-POT CLAVULANATE 875-125 MG PO TABS: 1.0000 | ORAL_TABLET | Freq: Two times a day (BID) | ORAL | 0 refills | Status: DC

## 2022-06-16 NOTE — Progress Notes (Signed)
Virtual Visit via Telephone Note   This visit type was conducted due to national recommendations for restrictions regarding the COVID-19 Pandemic (e.g. social distancing) in an effort to limit this patient's exposure and mitigate transmission in our community.  Due to her co-morbid illnesses, this patient is at least at moderate risk for complications without adequate follow up.  This format is felt to be most appropriate for this patient at this time.  The patient did not have access to video technology/had technical difficulties with video requiring transitioning to audio format only (telephone).  All issues noted in this document were discussed and addressed.  No physical exam could be performed with this format.  Evaluation Performed:  Follow-up visit  Date:  06/16/2022   ID:  Leah [name], DOB 1956/04/18, MRN [id number]  Patient Location: Home Provider Location: Office/Clinic  Participants: Patient Location of Patient: Home Location of Provider: Telehealth Consent was obtain for visit to be over via telehealth. I verified that I am speaking with the correct person using two identifiers.  PCP:  Lindell Spar, MD   Chief Complaint: Cough, nasal congestion and headache  History of Present Illness:    Leah [name] is a 66 y.o. female who has a televisit for c/o cough, nasal congestion and sinus pressure related headache for the last 10 days.  She also reports mild wheezing, but denies any dyspnea currently.  She has tried taking Coricidin with no relief.  Of note, she still smokes, but has been trying to cut down.  The patient does not have symptoms concerning for COVID-19 infection (fever, chills, cough, or new shortness of breath).   Past Medical, Surgical, Social History, Allergies, and Medications have been Reviewed.  Past Medical History:  Diagnosis Date   Arthritis    Borderline diabetes    Breast lump on right side at 3 o'clock position    Benign, fatty mass    History of kidney stones    History of shingles    Hyperlipidemia    Hypertension    Myocardial infarction ([place]) 1996    HAD ANGIOPLASTY WITH 2 STENTS   Tinnitus    right ear    Vitamin D deficiency 10/17/2019   Wears glasses    Past Surgical History:  Procedure Laterality Date   BREAST LUMPECTOMY  >10 YRS AGO   RT BREAST   COLONOSCOPY WITH PROPOFOL N/A 04/22/2021   Procedure: COLONOSCOPY WITH PROPOFOL;  Surgeon: Eloise Harman, DO;  [facility];  Service: Endoscopy;  Laterality: N/A;  11:45am   CORONARY ANGIOPLASTY  1996   ANGIOPLASTY / 2 STENTS   CYSTOSCOPY WITH STENT PLACEMENT Right 12/30/2015   Procedure: CYSTOSCOPY;  Surgeon: Cleon Gustin, MD;  [facility];  Service: Urology;  Laterality: Right;   CYSTOSCOPY WITH STENT PLACEMENT Right 01/09/2016   Procedure: CYSTOSCOPY WITH STENT PLACEMENT;  Surgeon: Cleon Gustin, MD;  [facility];  Service: Urology;  Laterality: Right;   HOLMIUM LASER APPLICATION Right 7/74/1287   Procedure: HOLMIUM LASER APPLICATION;  Surgeon: Cleon Gustin, MD;  [facility];  Service: Urology;  Laterality: Right;   LITHOTRIPSY     NEPHROLITHOTOMY Right 12/30/2015   Procedure: RIght Nephrostomy with access;  Surgeon: Cleon Gustin, MD;  [facility];  Service: Urology;  Laterality: Right;   NEPHROLITHOTOMY Right 01/09/2016   Procedure: RIGHT PERCUTANEOUS NEPHROLITHOTOMY SECOND LOOK , POSSIBLE DILATION OF TRACT;  Surgeon: Cleon Gustin, MD;  [facility];  Service: Urology;  Laterality: Right;   POLYPECTOMY  04/22/2021   Procedure: POLYPECTOMY;  Surgeon: Eloise Harman, DO;  [facility];  Service: Endoscopy;;     Current Meds  Medication Sig   amLODipine (NORVASC) 10 MG tablet Take 1 tablet (10 mg total) by mouth daily.   aspirin EC 81 MG tablet Take 81 mg by mouth every morning.    atorvastatin (LIPITOR) 80 MG tablet Take 1 tablet (80 mg total) by mouth daily.   Blood Pressure  Monitoring (BLOOD PRESSURE MONITOR AUTOMAT) DEVI 1 Device by Does not apply route daily. Check blood pressure daily   Ergocalciferol (VITAMIN D2) 50 MCG (2000 UT) TABS Take 2,000 Units by mouth daily.   ezetimibe (ZETIA) 10 MG tablet Take 1 tablet (10 mg total) by mouth daily.   hydrochlorothiazide (HYDRODIURIL) 12.5 MG tablet Take 1 tablet (12.5 mg total) by mouth daily.   ibuprofen (ADVIL) 200 MG tablet Take 400 mg by mouth every 6 (six) hours as needed for headache or moderate pain.   metoprolol tartrate (LOPRESSOR) 50 MG tablet TAKE 1 TABLET BY MOUTH ONCE A DAY.   Multiple Vitamin (MULTIVITAMIN WITH MINERALS) TABS tablet Take 1 tablet by mouth every morning.   olmesartan (BENICAR) 20 MG tablet TAKE (1) TABLET BY MOUTH ONCE DAILY.   Omega 3 1000 MG CAPS Take 1,000 mg by mouth daily.   vitamin B-12 (CYANOCOBALAMIN) 500 MCG tablet Take 500 mcg by mouth daily.   vitamin E 400 UNIT capsule Take 400 Units by mouth daily.     Allergies:   Codeine   ROS:   Please see the history of present illness.     All other systems reviewed and are negative.   Labs/Other Tests and Data Reviewed:    Recent Labs: 03/12/2022: ALT 24; BUN 11; Creatinine, Ser 1.24; Hemoglobin 14.6; Platelets 212; Potassium 4.0; Sodium 137; TSH 2.660   Recent Lipid Panel Lab Results  Component Value Date/Time   CHOL 132 03/12/2022 11:37 AM   TRIG 64 03/12/2022 11:37 AM   HDL 49 03/12/2022 11:37 AM   CHOLHDL 2.7 03/12/2022 11:37 AM   CHOLHDL 3.1 12/11/2020 12:00 AM   LDLCALC 70 03/12/2022 11:37 AM   LDLCALC 85 12/11/2020 12:00 AM    Wt Readings from Last 3 Encounters:  05/13/22 170 lb 12.8 oz (77.5 kg)  03/12/22 173 lb 12.8 oz (78.8 kg)  09/10/21 165 lb 0.6 oz (74.9 kg)     ASSESSMENT & PLAN:    Acute sinusitis Started empiric Augmentin as she has persistent symptoms despite symptomatic treatment Mucinex or Robitussin as needed for cough  COPD Likely has acute bronchitis as well Albuterol as needed for  dyspnea or wheezing  Time:   Today, I have spent 11 minutes reviewing the chart, including problem list, medications, and with the patient with telehealth technology discussing the above problems.   Medication Adjustments/Labs and Tests Ordered: Current medicines are reviewed at length with the patient today.  Concerns regarding medicines are outlined above.   Tests Ordered: No orders of the defined types were placed in this encounter.   Medication Changes: No orders of the defined types were placed in this encounter.    Note: This dictation was prepared with Dragon dictation along with smaller phrase technology. Similar sounding words can be transcribed inadequately or may not be corrected upon review. Any transcriptional errors that result from this process are unintentional.      Disposition:  Follow up  Signed, Lindell Spar,  MD  06/16/2022 4:32 PM     [facility]

## 2022-06-16 NOTE — Patient Instructions (Signed)
Please use Albuterol inhaler as needed for shortness of breath or wheezing.  Please try to cut down -> quit smoking.

## 2022-06-19 ENCOUNTER — Encounter: Payer: Self-pay | Admitting: *Deleted

## 2022-06-19 ENCOUNTER — Other Ambulatory Visit: Payer: Self-pay | Admitting: *Deleted

## 2022-06-19 NOTE — Patient Outreach (Deleted)
  Care Coordination   06/19/2022 Name: ELISIA STEPP MRN: [id number] DOB: 01/03/1956   Care Coordination Outreach Attempts:  An unsuccessful telephone outreach was attempted today to offer the patient information about available care coordination services as a benefit of their health plan.   Follow Up Plan:  Additional outreach attempts will be made to offer the patient care coordination information and services.   Encounter Outcome:  No Answer {THN Tip this will not be part of the note when signed-REQUIRED REPORT FIELD DO NOT DELETE (Optional):27901} Care Coordination Interventions Activated:  No  {THN Tip this will not be part of the note when signed-REQUIRED REPORT FIELD DO NOT DELETE (Optional):27901} Care Coordination Interventions:  No, not indicated  {THN Tip this will not be part of the note when signed-REQUIRED REPORT FIELD DO NOT DELETE (Optional):27901}  Ivann Trimarco C. Myrtie Neither, MSN, [facility] Gerontological Nurse Practitioner [facility] Care Management [phone]

## 2022-06-19 NOTE — Patient Outreach (Signed)
  Care Coordination   Initial Visit Note   06/19/2022 Name: JALONI SORBER MRN: [id number] DOB: [birth year]  [name] is a 66 y.o. year old female who sees Lindell Spar, MD for primary care. I spoke with  [name] by phone today  What matters to the patients health and wellness today?  I have to quit smoking because I have emphysema.    Goals Addressed               This Visit's Progress     Patient Stated     I want to quit smoking by my birthday in December. (pt-stated)          SDOH assessments and interventions completed:  Yes  SDOH Interventions Today    Flowsheet Row Most Recent Value  SDOH Interventions   Food Insecurity Interventions Intervention Not Indicated  Transportation Interventions Intervention Not Indicated        Care Coordination Interventions Activated:  Yes  Care Coordination Interventions:  Yes, provided   Follow up plan: Follow up call scheduled for 2 weeks, Aug 18th.    Encounter Outcome:  Pt. Visit Completed   Kayleen Memos C. Myrtie Neither, MSN, [facility] Gerontological Nurse Practitioner [facility] Care Management [phone]

## 2022-07-03 ENCOUNTER — Ambulatory Visit: Payer: Self-pay | Admitting: *Deleted

## 2022-07-03 NOTE — Patient Outreach (Signed)
  Care Coordination   Follow Up Visit Note   07/03/2022 Name: Leah [name] MRN: [id number] DOB: 20-Jan-1956  Leah [name] is a 66 y.o. year old female who sees Lindell Spar, MD for primary care. I spoke with  Leah [name] by phone today  What matters to the patients health and wellness today?  Continue my work on quitting smoking    Goals Addressed               This Visit's Progress     Patient Stated     I want to quit smoking by my birthday in December. (pt-stated)        Update 07/03/22 Assessment and interventions: Pt reports she has cut down from one whole pack to a pack lasts 2 days. She is chewing more gum. Doesn't smoke around her grandchildren but still smokes in the house. Doesn't smoke in the car. Talked secondary smoke and prevention of lung issues in grandchildren. Encouraged smoking outside. Discussed separating a pack into 3 portions. She voices that she doesn't want to use the 800 QUIT LINE.         SDOH assessments and interventions completed:  Yes     Care Coordination Interventions Activated:  Yes  Care Coordination Interventions:  Yes, provided   Follow up plan: Follow up call scheduled for 2 weeks.    Encounter Outcome:  Pt. Visit Completed   Kayleen Memos C. Myrtie Neither, MSN, [facility] Gerontological Nurse Practitioner [facility] Care Management [phone]

## 2022-07-13 ENCOUNTER — Other Ambulatory Visit: Payer: Self-pay | Admitting: Internal Medicine

## 2022-07-13 DIAGNOSIS — I10 Essential (primary) hypertension: Secondary | ICD-10-CM

## 2022-07-17 ENCOUNTER — Encounter: Payer: Self-pay | Admitting: *Deleted

## 2022-07-17 ENCOUNTER — Ambulatory Visit: Payer: Self-pay | Admitting: *Deleted

## 2022-07-17 ENCOUNTER — Other Ambulatory Visit: Payer: Self-pay | Admitting: Internal Medicine

## 2022-07-17 ENCOUNTER — Other Ambulatory Visit: Payer: Self-pay

## 2022-07-17 ENCOUNTER — Telehealth: Payer: Self-pay

## 2022-07-17 DIAGNOSIS — I2510 Atherosclerotic heart disease of native coronary artery without angina pectoris: Secondary | ICD-10-CM

## 2022-07-17 DIAGNOSIS — I10 Essential (primary) hypertension: Secondary | ICD-10-CM

## 2022-07-17 MED ORDER — HYDROCHLOROTHIAZIDE 12.5 MG PO TABS: 12.5000 mg | ORAL_TABLET | Freq: Every day | ORAL | 1 refills | Status: DC

## 2022-07-17 MED ORDER — METOPROLOL TARTRATE 50 MG PO TABS: 50.0000 mg | ORAL_TABLET | Freq: Every day | ORAL | 0 refills | Status: DC

## 2022-07-17 NOTE — Telephone Encounter (Signed)
Refills sent

## 2022-07-17 NOTE — Telephone Encounter (Signed)
Patient came in office and need med refills  metoprolol tartrate (LOPRESSOR) 50 MG tablet    hydrochlorothiazide (HYDRODIURIL) 12.5 MG tablet  Pharmacy: [facility]

## 2022-07-17 NOTE — Patient Outreach (Signed)
  Care Coordination   Follow Up Visit Note   07/17/2022 Name: Leah [name] MRN: [id number] DOB: 03/12/1956  Leah [name] is a 66 y.o. year old female who sees Lindell Spar, MD for primary care. I spoke with  Leah [name] by phone today.  What matters to the patients health and wellness today?  Continue smoking cessation attempt.    Goals Addressed               This Visit's Progress     Patient Stated     I want to quit smoking by my birthday in December. (pt-stated)   Not on track     Care Coordination Interventions: Assess progress on pt goal in cutting down - no change - still smoking 1/2 pack/day. She did not try the method previously suggested and requested that pt separate a pack into 3 for 3 days, rubber band the 6-7 cigs together. Take one portion out of cabinet each am and say to yourself, "These are the cigs I have for the day" Space your smoke breaks out. Don't cheat yourself. Chew gum. Reminded her that this is HER GOAL and she stated she wanted to [place] by her birthday 10/27/22.         SDOH assessments and interventions completed:  No     Care Coordination Interventions Activated:  Yes  Care Coordination Interventions:  Yes, provided   Follow up plan: Follow up call scheduled for Oct 2nd at 10:30    Encounter Outcome:  Pt. Visit Completed   Kayleen Memos C. Myrtie Neither, MSN, [facility] Gerontological Nurse Practitioner [facility] Care Management [phone]

## 2022-08-17 ENCOUNTER — Encounter: Payer: Self-pay | Admitting: *Deleted

## 2022-08-17 ENCOUNTER — Telehealth: Payer: Self-pay | Admitting: *Deleted

## 2022-08-17 NOTE — Patient Outreach (Signed)
  Care Coordination   Follow Up Visit Note   08/17/2022 Name: Leah [name] MRN: [id number] DOB: April 28, 1956  Leah [name] is a 66 y.o. year old female who sees Lindell Spar, MD for primary care. I spoke with  Leah [name] by phone today.  What matters to the patients health and wellness today?  I'm having trouble with my goal.    Goals Addressed               This Visit's Progress     Patient Stated     I want to quit smoking by my birthday in December. (pt-stated)   Not on track     Care Coordination Interventions: Assessment: Pt reports she is not making any progress on decreasing the # of cigs/day. She is using hard candy in place of a cigarette from time to time. She has not tried to bundle her cigs as we discussed.  Today, we discussed that this is HER goal and that she needs to make s ome changes to help herself cut down. Requested she try making smaller bundles of cigs and reduce the number so by the next time we talk in November she might be able to be down to 5/day. Also suggested she pick out only one place in the house to smoke. She agreed to try this. She will smoke only in her laundry/mud room. Encouraged her to only have the bundle for the day in that area so she can see what she has and space her smoking out for longer times in between.         SDOH assessments and interventions completed:  No     Care Coordination Interventions Activated:  Yes  Care Coordination Interventions:  Yes, provided   Follow up plan: Follow up call scheduled for 1 month.    Encounter Outcome:  Pt. Visit Completed   Kayleen Memos C. Myrtie Neither, MSN, [facility] Gerontological Nurse Practitioner [facility] Care Management [phone]

## 2022-08-26 ENCOUNTER — Other Ambulatory Visit: Payer: Self-pay | Admitting: Internal Medicine

## 2022-09-11 ENCOUNTER — Encounter: Payer: Self-pay | Admitting: Internal Medicine

## 2022-09-11 ENCOUNTER — Ambulatory Visit (INDEPENDENT_AMBULATORY_CARE_PROVIDER_SITE_OTHER): Payer: PPO | Admitting: Internal Medicine

## 2022-09-11 VITALS — BP 136/68 | HR 108 | Resp 18 | Ht 63.0 in | Wt 173.4 lb

## 2022-09-11 DIAGNOSIS — I2510 Atherosclerotic heart disease of native coronary artery without angina pectoris: Secondary | ICD-10-CM | POA: Diagnosis not present

## 2022-09-11 DIAGNOSIS — N1831 Chronic kidney disease, stage 3a: Secondary | ICD-10-CM | POA: Insufficient documentation

## 2022-09-11 DIAGNOSIS — R7303 Prediabetes: Secondary | ICD-10-CM

## 2022-09-11 DIAGNOSIS — I10 Essential (primary) hypertension: Secondary | ICD-10-CM | POA: Diagnosis not present

## 2022-09-11 DIAGNOSIS — I25118 Atherosclerotic heart disease of native coronary artery with other forms of angina pectoris: Secondary | ICD-10-CM | POA: Diagnosis not present

## 2022-09-11 DIAGNOSIS — J439 Emphysema, unspecified: Secondary | ICD-10-CM | POA: Diagnosis not present

## 2022-09-11 DIAGNOSIS — Z720 Tobacco use: Secondary | ICD-10-CM

## 2022-09-11 DIAGNOSIS — Z23 Encounter for immunization: Secondary | ICD-10-CM

## 2022-09-11 NOTE — Assessment & Plan Note (Signed)
Last 2 CMP showed GFR 59 -> 48 Avoid nephrotoxic agents Needs to improve fluid intake On Olmesartan and HCTZ Check urine microalbumin/creatinine ratio

## 2022-09-11 NOTE — Assessment & Plan Note (Signed)
S/p stent placement, followed by cardiology On aspirin and statin On metoprolol No anginal symptoms currently 

## 2022-09-11 NOTE — Progress Notes (Signed)
Established Patient Office Visit  Subjective:  Patient ID: Leah [name], female    DOB: 04-07-1956  Age: 66 y.o. MRN: [id number]  CC:  Chief Complaint  Patient presents with   Follow-up    Follow up CAD HTN     HPI Leah [name] is a 66 y.o. female with past medical history of CAD s/p stent placement, HTN, CKD, emphysema, HLD, OA and tobacco abuse who presents for f/u of her chronic medical conditions.  CAD: She has had stent placed to RCA in 1998.  She denies any anginal pain, dyspnea or palpitations currently.  She is on aspirin, statin, Zetia and metoprolol currently.  She follows up with cardiology. BP is well-controlled. Takes medications regularly. Patient denies headache, dizziness, chest pain, dyspnea or palpitations.  CKD: Her last CMP showed worsening of GFR to 48.  Her ALP was also elevated.  She denies any dysuria, hematuria or urinary retention or resistance.  She takes vitamin D, vitamin B12 and vitamin D supplements.   She also reports history of prediabetes.  Denies any fatigue, polyuria or polydipsia.     Past Medical History:  Diagnosis Date   Arthritis    Borderline diabetes    Breast lump on right side at 3 o'clock position    Benign, fatty mass   History of kidney stones    History of shingles    Hyperlipidemia    Hypertension    Myocardial infarction ([place]) 1996    HAD ANGIOPLASTY WITH 2 STENTS   Tinnitus    right ear    Vitamin D deficiency 10/17/2019   Wears glasses     Past Surgical History:  Procedure Laterality Date   BREAST LUMPECTOMY  >10 YRS AGO   RT BREAST   COLONOSCOPY WITH PROPOFOL N/A 04/22/2021   Procedure: COLONOSCOPY WITH PROPOFOL;  Surgeon: Eloise Harman, DO;  [facility]: [facility];  Service: Endoscopy;  Laterality: N/A;  11:45am   CORONARY ANGIOPLASTY  1996   ANGIOPLASTY / 2 STENTS   CYSTOSCOPY WITH STENT PLACEMENT Right 12/30/2015   Procedure: CYSTOSCOPY;  Surgeon: Cleon Gustin, MD;  Location: [facility];  Service:  Urology;  Laterality: Right;   CYSTOSCOPY WITH STENT PLACEMENT Right 01/09/2016   Procedure: CYSTOSCOPY WITH STENT PLACEMENT;  Surgeon: Cleon Gustin, MD;  Location: [facility];  Service: Urology;  Laterality: Right;   HOLMIUM LASER APPLICATION Right 07/03/5630   Procedure: HOLMIUM LASER APPLICATION;  Surgeon: Cleon Gustin, MD;  Location: [facility];  Service: Urology;  Laterality: Right;   LITHOTRIPSY     NEPHROLITHOTOMY Right 12/30/2015   Procedure: RIght Nephrostomy with access;  Surgeon: Cleon Gustin, MD;  Location: [facility];  Service: Urology;  Laterality: Right;   NEPHROLITHOTOMY Right 01/09/2016   Procedure: RIGHT PERCUTANEOUS NEPHROLITHOTOMY SECOND LOOK , POSSIBLE DILATION OF TRACT;  Surgeon: Cleon Gustin, MD;  Location: [facility];  Service: Urology;  Laterality: Right;   POLYPECTOMY  04/22/2021   Procedure: POLYPECTOMY;  Surgeon: Eloise Harman, DO;  [facility];  Service: Endoscopy;;    Family History  Problem Relation Age of Onset   Heart disease Mother    Diabetes Mother    Cancer Father        bone   Cirrhosis Father    Stroke Maternal Grandmother    Stroke Maternal Grandfather    Heart disease Sister    Obesity Brother    Healthy Daughter     Social History   Socioeconomic  History   Marital status: Widowed    Spouse name: Not on file   Number of children: 1   Years of education: 31   Highest education level: Master's degree (e.g., MA, MS, MEng, MEd, MSW, MBA)  Occupational History   Occupation: Retired  Tobacco Use   Smoking status: Every Day    Packs/day: 1.00    Years: 40.00    Total pack years: 40.00    Types: Cigarettes   Smokeless tobacco: Never  Vaping Use   Vaping Use: Never used  Substance and Sexual Activity   Alcohol use: No    Alcohol/week: 0.0 standard drinks of alcohol   Drug use: No   Sexual activity: Yes  Other Topics Concern   Not on file  Social History Narrative   Single,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    Social Determinants of Health   Financial Resource Strain: Low Risk  (11/25/2021)   Overall Financial Resource Strain (CARDIA)    Difficulty of Paying Living Expenses: Not hard at all  Food Insecurity: No Food Insecurity (06/19/2022)   Hunger Vital Sign    Worried About Running Out of Food in the Last Year: Never true    Ran Out of Food in the Last Year: Never true  Transportation Needs: No Transportation Needs (06/19/2022)   PRAPARE - Hydrologist (Medical): No    Lack of Transportation (Non-Medical): No  Physical Activity: Sufficiently Active (11/25/2021)   Exercise Vital Sign    Days of Exercise per Week: 5 days    Minutes of Exercise per Session: 30 min  Stress: No Stress Concern Present (11/25/2021)   [place]    Feeling of Stress : Not at all  Social Connections: Socially Isolated (11/25/2021)   Social Connection and Isolation Panel [NHANES]    Frequency of Communication with Friends and Family: More than three times a week    Frequency of Social Gatherings with Friends and Family: Once a week    Attends Religious Services: Never    Marine scientist or Organizations: No    Attends Archivist Meetings: Never    Marital Status: Widowed  Intimate Partner Violence: Not At Risk (11/25/2021)   Humiliation, Afraid, Rape, and Kick questionnaire    Fear of Current or Ex-Partner: No    Emotionally Abused: No    Physically Abused: No    Sexually Abused: No    Outpatient Medications Prior to Visit  Medication Sig Dispense Refill   albuterol (VENTOLIN HFA) 108 (90 Base) MCG/ACT inhaler Inhale 2 puffs into the lungs every 6 (six) hours as needed for wheezing or shortness of breath. 8 g 0   amLODipine (NORVASC) 10 MG tablet TAKE ONE TABLET BY MOUTH ONCE DAILY. 90 tablet 0   aspirin EC 81 MG tablet Take 81 mg by mouth every morning.      atorvastatin (LIPITOR) 80 MG tablet TAKE 1  TABLET BY MOUTH DAILY. 90 tablet 0   Blood Pressure Monitoring (BLOOD PRESSURE MONITOR AUTOMAT) DEVI 1 Device by Does not apply route daily. Check blood pressure daily 1 each 0   Ergocalciferol (VITAMIN D2) 50 MCG (2000 UT) TABS Take 2,000 Units by mouth daily.     ezetimibe (ZETIA) 10 MG tablet TAKE ONE TABLET BY MOUTH ONCE DAILY. 90 tablet 0   hydrochlorothiazide (HYDRODIURIL) 12.5 MG tablet Take 1 tablet (12.5 mg total) by mouth daily. 90 tablet 1   ibuprofen (ADVIL) 200 MG tablet  Take 400 mg by mouth every 6 (six) hours as needed for headache or moderate pain.     metoprolol tartrate (LOPRESSOR) 50 MG tablet Take 1 tablet (50 mg total) by mouth daily. 90 tablet 0   Multiple Vitamin (MULTIVITAMIN WITH MINERALS) TABS tablet Take 1 tablet by mouth every morning.     olmesartan (BENICAR) 20 MG tablet TAKE (1) TABLET BY MOUTH ONCE DAILY. 90 tablet 0   Omega 3 1000 MG CAPS Take 1,000 mg by mouth daily.     vitamin B-12 (CYANOCOBALAMIN) 500 MCG tablet Take 500 mcg by mouth daily.     vitamin E 400 UNIT capsule Take 400 Units by mouth daily.     amoxicillin-clavulanate (AUGMENTIN) 875-125 MG tablet Take 1 tablet by mouth 2 (two) times daily. (Patient not taking: Reported on 09/11/2022) 14 tablet 0   No facility-administered medications prior to visit.    Allergies  Allergen Reactions   Codeine     REACTION: " Makes me high and blacked out in the 1980s"    ROS Review of Systems  Constitutional:  Negative for chills and fever.  HENT:  Negative for congestion, sinus pressure, sinus pain and sore throat.        Hearing problem  Eyes:  Negative for pain and discharge.  Respiratory:  Negative for cough and shortness of breath.   Cardiovascular:  Negative for chest pain and palpitations.  Gastrointestinal:  Negative for abdominal pain, constipation, diarrhea, nausea and vomiting.  Endocrine: Negative for polydipsia and polyuria.  Genitourinary:  Negative for dysuria and hematuria.   Musculoskeletal:  Negative for neck pain and neck stiffness.  Skin:  Negative for rash.  Neurological:  Negative for dizziness and weakness.  Psychiatric/Behavioral:  Negative for agitation and behavioral problems.       Objective:    Physical Exam Vitals reviewed.  Constitutional:      General: She is not in acute distress.    Appearance: She is not diaphoretic.  HENT:     Head: Normocephalic and atraumatic.     Nose: Nose normal.     Mouth/Throat:     Mouth: Mucous membranes are moist.  Eyes:     General: No scleral icterus.    Extraocular Movements: Extraocular movements intact.  Neck:     Vascular: No carotid bruit.  Cardiovascular:     Rate and Rhythm: Normal rate and regular rhythm.     Pulses: Normal pulses.     Heart sounds: Normal heart sounds. No murmur heard. Pulmonary:     Breath sounds: Normal breath sounds. No wheezing or rales.  Musculoskeletal:     Cervical back: Neck supple. No tenderness.     Right lower leg: No edema.     Left lower leg: No edema.  Skin:    General: Skin is warm.     Findings: No rash.  Neurological:     General: No focal deficit present.     Mental Status: She is alert and oriented to person, place, and time.  Psychiatric:        Mood and Affect: Mood normal.        Behavior: Behavior normal.     BP 136/68 (BP Location: Right Arm, Patient Position: Sitting, Cuff Size: Normal)   Pulse (!) 108   Resp 18   Ht _0  (1.6 m)   Wt 173 lb 6.4 oz (78.7 kg)   LMP 05/16/2002   SpO2 98%   BMI 30.72 kg/m  Wt Readings from  Last 3 Encounters:  09/11/22 173 lb 6.4 oz (78.7 kg)  05/13/22 170 lb 12.8 oz (77.5 kg)  03/12/22 173 lb 12.8 oz (78.8 kg)    Lab Results  Component Value Date   TSH 2.660 03/12/2022   Lab Results  Component Value Date   WBC 5.9 03/12/2022   HGB 14.6 03/12/2022   HCT 44.2 03/12/2022   MCV 88 03/12/2022   PLT 212 03/12/2022   Lab Results  Component Value Date   NA 137 03/12/2022   K 4.0 03/12/2022    CO2 23 03/12/2022   GLUCOSE 101 (H) 03/12/2022   BUN 11 03/12/2022   CREATININE 1.24 (H) 03/12/2022   BILITOT 0.3 03/12/2022   ALKPHOS 153 (H) 03/12/2022   AST 32 03/12/2022   ALT 24 03/12/2022   PROT 7.2 03/12/2022   ALBUMIN 4.3 03/12/2022   CALCIUM 9.5 03/12/2022   ANIONGAP 7 12/12/2019   EGFR 48 (L) 03/12/2022   Lab Results  Component Value Date   CHOL 132 03/12/2022   Lab Results  Component Value Date   HDL 49 03/12/2022   Lab Results  Component Value Date   LDLCALC 70 03/12/2022   Lab Results  Component Value Date   TRIG 64 03/12/2022   Lab Results  Component Value Date   CHOLHDL 2.7 03/12/2022   Lab Results  Component Value Date   HGBA1C 6.2 (H) 03/12/2022      Assessment & Plan:   Problem List Items Addressed This Visit       Cardiovascular and Mediastinum   Essential hypertension - Primary    BP Readings from Last 1 Encounters:  09/11/22 136/68  Well-controlled with amlodipine, HCTZ and Olmesartan Counseled for compliance with the medications Advised DASH diet and moderate exercise/walking, at least 150 mins/week      Relevant Orders   Basic Metabolic Panel (BMET)   Coronary artery disease involving native heart without angina pectoris    S/p stent placement, followed by cardiology On aspirin and statin On metoprolol No anginal symptoms currently      Relevant Orders   Basic Metabolic Panel (BMET)     Respiratory   Pulmonary emphysema ([place])    Noted on CT chest Asymptomatic currently Advised to cut down -> quit smoking        Genitourinary   Stage 3a chronic kidney disease ([place])    Last 2 CMP showed GFR 59 -> 48 Avoid nephrotoxic agents Needs to improve fluid intake On Olmesartan and HCTZ Check urine microalbumin/creatinine ratio       Relevant Orders   Basic Metabolic Panel (BMET)   Urine Microalbumin w/creat. ratio     Other   Tobacco abuse    Smoked about 1 pack/day X >45 years, has cut down to 0.5 pack/day  now  Asked about quitting: confirms that she currently smokes cigarettes Advise to quit smoking: Educated about QUITTING to reduce the risk of cancer, cardio and cerebrovascular disease. Assess willingness: Unwilling to quit at this time, but is working on cutting back. Assist with counseling and pharmacotherapy: Counseled for 5 minutes and literature provided. Arrange for follow up: Follow up in 3 months and continue to offer help.      Prediabetes    Check HbA1c Continue to follow low-carb diet and ambulate as tolerated      Relevant Orders   Hemoglobin A1c   Other Visit Diagnoses     Need for immunization against influenza       Relevant Orders  Flu Vaccine QUAD High Dose(Fluad) (Completed)       No orders of the defined types were placed in this encounter.   Follow-up: Return in about 4 months (around 01/12/2023) for HTN and CKD.    Lindell Spar, MD

## 2022-09-11 NOTE — Assessment & Plan Note (Signed)
Check HbA1c Continue to follow low-carb diet and ambulate as tolerated 

## 2022-09-11 NOTE — Patient Instructions (Signed)
Please continue taking medications as prescribed.  Please continue to follow low salt diet and ambulate as tolerated.  Please consider getting Shingrix and Tdap vaccines at your local pharmacy.

## 2022-09-11 NOTE — Assessment & Plan Note (Signed)
Smoked about 1 pack/day X >45 years, has cut down to 0.5 pack/day now  Asked about quitting: confirms that she currently smokes cigarettes Advise to quit smoking: Educated about QUITTING to reduce the risk of cancer, cardio and cerebrovascular disease. Assess willingness: Unwilling to quit at this time, but is working on cutting back. Assist with counseling and pharmacotherapy: Counseled for 5 minutes and literature provided. Arrange for follow up: Follow up in 3 months and continue to offer help.

## 2022-09-11 NOTE — Assessment & Plan Note (Signed)
BP Readings from Last 1 Encounters:  09/11/22 136/68   Well-controlled with amlodipine, HCTZ and Olmesartan Counseled for compliance with the medications Advised DASH diet and moderate exercise/walking, at least 150 mins/week

## 2022-09-11 NOTE — Assessment & Plan Note (Signed)
Noted on CT chest Asymptomatic currently Advised to cut down -> quit smoking 

## 2022-09-13 LAB — BASIC METABOLIC PANEL
BUN/Creatinine Ratio: 10 — ABNORMAL LOW (ref 12–28)
BUN: 11 mg/dL (ref 8–27)
CO2: 20 mmol/L (ref 20–29)
Calcium: 9.1 mg/dL (ref 8.7–10.3)
Chloride: 97 mmol/L (ref 96–106)
Creatinine, Ser: 1.05 mg/dL — ABNORMAL HIGH (ref 0.57–1.00)
Glucose: 90 mg/dL (ref 70–99)
Sodium: 139 mmol/L (ref 134–144)
eGFR: 58 mL/min/{1.73_m2} — ABNORMAL LOW (ref >59)

## 2022-09-13 LAB — HEMOGLOBIN A1C
Est. average glucose Bld gHb Est-mCnc: 137 mg/dL
Hgb A1c MFr Bld: 6.4 % — ABNORMAL HIGH (ref 4.8–5.6)

## 2022-09-14 ENCOUNTER — Telehealth: Payer: Self-pay

## 2022-09-14 LAB — MICROALBUMIN / CREATININE URINE RATIO
Creatinine, Urine: 111.9 mg/dL
Microalb/Creat Ratio: 79 mg/g creat — ABNORMAL HIGH (ref 0–29)
Microalbumin, Urine: 88.8 ug/mL

## 2022-09-14 NOTE — Telephone Encounter (Signed)
Patient returning call.

## 2022-09-14 NOTE — Telephone Encounter (Signed)
Pt given lab results with verbal understanding

## 2022-09-16 ENCOUNTER — Encounter: Payer: Self-pay | Admitting: *Deleted

## 2022-09-16 ENCOUNTER — Ambulatory Visit: Payer: Self-pay | Admitting: *Deleted

## 2022-09-16 ENCOUNTER — Other Ambulatory Visit: Payer: Self-pay | Admitting: Internal Medicine

## 2022-09-16 ENCOUNTER — Telehealth: Payer: Self-pay | Admitting: *Deleted

## 2022-09-16 DIAGNOSIS — Z720 Tobacco use: Secondary | ICD-10-CM

## 2022-09-16 MED ORDER — NICOTINE 14 MG/24HR TD PT24: 14.0000 mg | MEDICATED_PATCH | Freq: Every day | TRANSDERMAL | 1 refills | Status: DC

## 2022-09-16 NOTE — Patient Outreach (Signed)
  Care Coordination   Follow Up Visit Note   09/16/2022 Name: Leah [name] MRN: [id number] DOB: 07-18-1956  Leah [name] is a 66 y.o. year old female who sees Lindell Spar, MD for primary care. I spoke with  Leah [name] by phone today.  What matters to the patients health and wellness today?  Continue working on smoking cessation.    Goals Addressed               This Visit's Progress     Patient Stated     I want to quit smoking by my birthday in December. (pt-stated)        Care Coordination Interventions: Assessed progress on cutting down the number of cigarettes she is smoking. A pack lasts 2-3 days. She doesn't smoke in the house anymore. She goes to the back porch. Discussed consideration of using a nicotine patch she is willing to do so. Messaged Dr. Posey Pronto requesting order for nicotine patch and he agreed and sent the order to the pharmacy. NP signing off care team and transition pt to Jacqlyn Larsen, RN for CCM.          SDOH assessments and interventions completed:  No   Care Coordination Interventions Activated:  Yes  Care Coordination Interventions:  Yes, provided   Follow up plan: Referral made to CCM team to follow.    Encounter Outcome:  Pt. Visit Completed   Kayleen Memos C. Myrtie Neither, MSN, [facility] Gerontological Nurse Practitioner [facility] Care Management [phone]

## 2022-10-02 ENCOUNTER — Encounter: Payer: Self-pay | Admitting: Internal Medicine

## 2022-10-12 ENCOUNTER — Other Ambulatory Visit: Payer: Self-pay | Admitting: Internal Medicine

## 2022-10-12 ENCOUNTER — Other Ambulatory Visit: Payer: Self-pay | Admitting: Family Medicine

## 2022-10-12 DIAGNOSIS — I2510 Atherosclerotic heart disease of native coronary artery without angina pectoris: Secondary | ICD-10-CM

## 2022-10-12 DIAGNOSIS — I10 Essential (primary) hypertension: Secondary | ICD-10-CM

## 2022-10-26 ENCOUNTER — Other Ambulatory Visit: Payer: Self-pay | Admitting: Internal Medicine

## 2022-10-26 DIAGNOSIS — I10 Essential (primary) hypertension: Secondary | ICD-10-CM

## 2022-10-26 DIAGNOSIS — I2510 Atherosclerotic heart disease of native coronary artery without angina pectoris: Secondary | ICD-10-CM

## 2022-11-04 ENCOUNTER — Other Ambulatory Visit: Payer: Self-pay | Admitting: Internal Medicine

## 2022-11-04 DIAGNOSIS — I10 Essential (primary) hypertension: Secondary | ICD-10-CM

## 2022-11-26 ENCOUNTER — Encounter: Payer: Self-pay | Admitting: Family Medicine

## 2022-11-26 ENCOUNTER — Ambulatory Visit (INDEPENDENT_AMBULATORY_CARE_PROVIDER_SITE_OTHER): Payer: PPO | Admitting: Family Medicine

## 2022-11-26 VITALS — BP 125/75 | HR 90 | Ht 63.0 in | Wt 174.0 lb

## 2022-11-26 DIAGNOSIS — N200 Calculus of kidney: Secondary | ICD-10-CM | POA: Diagnosis not present

## 2022-11-26 NOTE — Assessment & Plan Note (Addendum)
Pain and tenderness felt on light palpation on the right upper back.  Patient states drinks a pepsi can once day. Encourage patient to increase fluid intake and to limit the use of drinking soda. CT abdomen pelvis W/O contrast ordered.

## 2022-11-26 NOTE — Patient Instructions (Addendum)
Please Follow up with CT at [place] your drinking of Pepsi  Increase your water intake

## 2022-11-26 NOTE — Assessment & Plan Note (Deleted)
Right flank pain   Fluid intake: 16oz of water of day Coffee  Pepsi once day

## 2022-11-26 NOTE — Progress Notes (Signed)
Acute Office Visit  Subjective:     Patient ID: Leah [name], female    DOB: February 09, 1956, 67 y.o.   MRN: [id number]  Chief Complaint  Patient presents with   Back Pain    Patient complains of lower R back pain starting 5 days ago, 1/6. Says it is intermittent pain that sometimes gets worse when she eats.    Leah [name] 67 year old female with past medical history reviewed. Presents to the clinic for right flank pain for the past 7 days, that occurs constantly and after bowel movements. Patient reports pain is stabbing and aching sensation. She takes ibuprofen 400 mg and heating pad for treatment relief.  Flank Pain This is a new problem. The current episode started in the past 7 days. The problem occurs daily. The problem has been gradually worsening since onset. The quality of the pain is described as stabbing, aching and cramping. The pain does not radiate. The pain is at a severity of 9/10. The pain is moderate. The pain is Worse during the night. The symptoms are aggravated by standing. Pertinent negatives include no abdominal pain, bladder incontinence, bowel incontinence, chest pain, dysuria, fever, headaches, leg pain, pelvic pain or weakness. Risk factors include lack of exercise and sedentary lifestyle (history of kidney stones). She has tried heat and bed rest (ibuprofen 400 mg) for the symptoms. The treatment provided moderate relief.    Review of Systems  Constitutional:  Negative for fever.  HENT:  Negative for hearing loss.   Eyes:  Negative for blurred vision.  Respiratory:  Negative for shortness of breath.   Cardiovascular:  Negative for chest pain.  Gastrointestinal:  Negative for abdominal pain and bowel incontinence.  Genitourinary:  Positive for flank pain. Negative for bladder incontinence, dysuria, frequency, hematuria, pelvic pain and urgency.  Musculoskeletal:  Negative for back pain.  Skin:  Negative for itching and rash.  Neurological:  Negative for  weakness and headaches.  Psychiatric/Behavioral:  The patient is not nervous/anxious.         Objective:    BP 125/75   Pulse 90   Ht '5\' 3"'$  (1.6 m)   Wt 174 lb (78.9 kg)   LMP 05/16/2002   SpO2 97%   BMI 30.82 kg/m  BP Readings from Last 3 Encounters:  11/26/22 125/75  09/11/22 136/68  05/13/22 138/80      Physical Exam Vitals reviewed.  HENT:     Head: Normocephalic.     Nose: Nose normal.  Eyes:     Pupils: Pupils are equal, round, and reactive to light.  Cardiovascular:     Rate and Rhythm: Normal rate and regular rhythm.     Pulses: Normal pulses.  Pulmonary:     Effort: Pulmonary effort is normal. No respiratory distress.  Abdominal:     General: Bowel sounds are normal.     Palpations: Abdomen is soft.     Tenderness: There is no abdominal tenderness. There is no guarding.  Musculoskeletal:        General: Normal range of motion.     Cervical back: Normal range of motion.  Skin:    General: Skin is warm and dry.     Capillary Refill: Capillary refill takes less than 2 seconds.  Neurological:     General: No focal deficit present.     Mental Status: She is alert.     Coordination: Coordination normal.     Gait: Gait normal.  Psychiatric:  Mood and Affect: Mood normal.     No results found for any visits on 11/26/22.      Assessment & Plan:   Problem List Items Addressed This Visit       Genitourinary   Nephrolithiasis - Primary    Pain and tenderness felt on light palpation on the right upper back.  Patient states drinks a pepsi can once day. Encourage patient to increase fluid intake and to limit the use of drinking soda. CT abdomen pelvis W/O contrast ordered.      Relevant Orders   CT ABDOMEN PELVIS WO CONTRAST     Other   History of nephrolithiasis    No orders of the defined types were placed in this encounter.   No follow-ups on file.  Renard Hamper Ria Comment, FNP

## 2022-11-30 ENCOUNTER — Other Ambulatory Visit: Payer: Self-pay | Admitting: Family Medicine

## 2022-11-30 ENCOUNTER — Ambulatory Visit (HOSPITAL_COMMUNITY)
Admission: RE | Admit: 2022-11-30 | Discharge: 2022-11-30 | Disposition: A | Discharge status: Home / Self Care | Payer: PPO | Source: Ambulatory Visit | Attending: Family Medicine | Admitting: Family Medicine

## 2022-11-30 DIAGNOSIS — R109 Unspecified abdominal pain: Secondary | ICD-10-CM | POA: Diagnosis not present

## 2022-11-30 DIAGNOSIS — N200 Calculus of kidney: Secondary | ICD-10-CM

## 2022-11-30 DIAGNOSIS — I700 Atherosclerosis of aorta: Secondary | ICD-10-CM | POA: Diagnosis not present

## 2022-11-30 NOTE — Progress Notes (Signed)
Please let patient know CT is positive for kidney stones Urology referral placed

## 2022-12-01 ENCOUNTER — Other Ambulatory Visit: Payer: Self-pay | Admitting: Internal Medicine

## 2022-12-08 ENCOUNTER — Encounter: Payer: Self-pay | Admitting: Cardiology

## 2022-12-08 ENCOUNTER — Ambulatory Visit: Payer: PPO | Attending: Cardiology | Admitting: Cardiology

## 2022-12-08 VITALS — BP 118/64 | HR 86 | Ht 63.0 in | Wt 175.0 lb

## 2022-12-08 DIAGNOSIS — I2510 Atherosclerotic heart disease of native coronary artery without angina pectoris: Secondary | ICD-10-CM | POA: Diagnosis not present

## 2022-12-08 DIAGNOSIS — I10 Essential (primary) hypertension: Secondary | ICD-10-CM | POA: Diagnosis not present

## 2022-12-08 DIAGNOSIS — E782 Mixed hyperlipidemia: Secondary | ICD-10-CM | POA: Diagnosis not present

## 2022-12-08 NOTE — Progress Notes (Signed)
Clinical Summary Leah [name] is a 67 y.o.female seen today for follow up of the following medical problems.      1. HTN - compliant withmeds   2. History of CAD - epic notes mention prior stenting about 25 years ago. From further review she had an MI in 1998, received stenting to her RCA    - no chest pains, no SOB/DOE - compliant with meds     3. 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 Past Medical History:  Diagnosis Date   Arthritis    Borderline diabetes    Breast lump on right side at 3 o'clock position    Benign, fatty mass   History of kidney stones    History of shingles    Hyperlipidemia    Hypertension    Myocardial infarction ([place]) 1996    HAD ANGIOPLASTY WITH 2 STENTS   Tinnitus    right ear    Vitamin D deficiency 10/17/2019   Wears glasses      Allergies  Allergen Reactions   Codeine     REACTION: " Makes me high and blacked out in the 1980s"     Current Outpatient Medications  Medication Sig Dispense Refill   albuterol (VENTOLIN HFA) 108 (90 Base) MCG/ACT inhaler Inhale 2 puffs into the lungs every 6 (six) hours as needed for wheezing or shortness of breath. 8 g 0   amLODipine (NORVASC) 10 MG tablet TAKE ONE TABLET BY MOUTH ONCE DAILY. 90 tablet 0   aspirin EC 81 MG tablet Take 81 mg by mouth every morning.      atorvastatin (LIPITOR) 80 MG tablet TAKE 1 TABLET BY MOUTH DAILY. 90 tablet 0   Blood Pressure Monitoring (BLOOD PRESSURE MONITOR AUTOMAT) DEVI 1 Device by Does not apply route daily. Check blood pressure daily 1 each 0   Ergocalciferol (VITAMIN D2) 50 MCG (2000 UT) TABS Take 2,000 Units by mouth daily.     ezetimibe (ZETIA) 10 MG tablet TAKE ONE TABLET BY MOUTH ONCE DAILY. 90 tablet 0   hydrochlorothiazide (HYDRODIURIL) 12.5 MG  tablet Take 1 tablet (12.5 mg total) by mouth daily. 90 tablet 1   ibuprofen (ADVIL) 200 MG tablet Take 400 mg by mouth every 6 (six) hours as needed for headache or moderate pain.     metoprolol tartrate (LOPRESSOR) 50 MG tablet TAKE 1 TABLET BY MOUTH ONCE A DAY. 90 tablet 0   Multiple Vitamin (MULTIVITAMIN WITH MINERALS) TABS tablet Take 1 tablet by mouth every morning.     nicotine (NICODERM CQ - DOSED IN MG/24 HOURS) 14 mg/24hr patch Place 1 patch (14 mg total) onto the skin daily. 28 patch 1   olmesartan (BENICAR) 20 MG tablet TAKE (1) TABLET BY MOUTH ONCE DAILY. 90 tablet 0   Omega 3 1000 MG CAPS Take 1,000 mg by mouth daily.     vitamin B-12 (CYANOCOBALAMIN) 500 MCG tablet Take 500 mcg by mouth daily.     vitamin E 400 UNIT capsule Take 400 Units by mouth daily.     No current facility-administered medications for this visit.     Past Surgical History:  Procedure Laterality Date   BREAST LUMPECTOMY  >10 YRS AGO   RT BREAST   COLONOSCOPY WITH  PROPOFOL N/A 04/22/2021   Procedure: COLONOSCOPY WITH PROPOFOL;  Surgeon: Eloise Harman, DO;  [facility];  Service: Endoscopy;  Laterality: N/A;  11:45am   CORONARY ANGIOPLASTY  1996   ANGIOPLASTY / 2 STENTS   CYSTOSCOPY WITH STENT PLACEMENT Right 12/30/2015   Procedure: CYSTOSCOPY;  Surgeon: Cleon Gustin, MD;  [facility]: [facility];  Service: Urology;  Laterality: Right;   CYSTOSCOPY WITH STENT PLACEMENT Right 01/09/2016   Procedure: CYSTOSCOPY WITH STENT PLACEMENT;  Surgeon: Cleon Gustin, MD;  [facility];  Service: Urology;  Laterality: Right;   HOLMIUM LASER APPLICATION Right 0/98/1191   Procedure: HOLMIUM LASER APPLICATION;  Surgeon: Cleon Gustin, MD;  [facility];  Service: Urology;  Laterality: Right;   LITHOTRIPSY     NEPHROLITHOTOMY Right 12/30/2015   Procedure: RIght Nephrostomy with access;  Surgeon: Cleon Gustin, MD;  [facility];  Service: Urology;  Laterality: Right;    NEPHROLITHOTOMY Right 01/09/2016   Procedure: RIGHT PERCUTANEOUS NEPHROLITHOTOMY SECOND LOOK , POSSIBLE DILATION OF TRACT;  Surgeon: Cleon Gustin, MD;  [facility];  Service: Urology;  Laterality: Right;   POLYPECTOMY  04/22/2021   Procedure: POLYPECTOMY;  Surgeon: Eloise Harman, DO;  [facility];  Service: Endoscopy;;     Allergies  Allergen Reactions   Codeine     REACTION: " Makes me high and blacked out in the 1980s"      Family History  Problem Relation Age of Onset   Heart disease Mother    Diabetes Mother    Cancer Father        bone   Cirrhosis Father    Stroke Maternal Grandmother    Stroke Maternal Grandfather    Heart disease Sister    Obesity Brother    Healthy Daughter      Social History Leah [name] reports that she has been smoking cigarettes. She has a 40.00 pack-year smoking history. She has never used smokeless tobacco. Leah [name] reports no history of alcohol use.   Review of Systems CONSTITUTIONAL: No weight loss, fever, chills, weakness or fatigue.  HEENT: Eyes: No visual loss, blurred vision, double vision or yellow sclerae.No hearing loss, sneezing, congestion, runny nose or sore throat.  SKIN: No rash or itching.  CARDIOVASCULAR: per hpi RESPIRATORY: No shortness of breath, cough or sputum.  GASTROINTESTINAL: No anorexia, nausea, vomiting or diarrhea. No abdominal pain or blood.  GENITOURINARY: No burning on urination, no polyuria NEUROLOGICAL: No headache, dizziness, syncope, paralysis, ataxia, numbness or tingling in the extremities. No change in bowel or bladder control.  MUSCULOSKELETAL: No muscle, back pain, joint pain or stiffness.  LYMPHATICS: No enlarged nodes. No history of splenectomy.  PSYCHIATRIC: No history of depression or anxiety.  ENDOCRINOLOGIC: No reports of sweating, cold or heat intolerance. No polyuria or polydipsia.  Marland Kitchen   Physical Examination Today's Vitals   12/08/22 0831  BP: 118/64  Pulse: 86   SpO2: 98%  Weight: 175 lb (79.4 kg)  Height: '5\' 3"'$  (1.6 m)   Body mass index is 31 kg/m.  Gen: resting comfortably, no acute distress HEENT: no scleral icterus, pupils equal round and reactive, no palptable cervical adenopathy,  CV: RRR, no m/rg, no jvd Resp: Clear to auscultation bilaterally GI: abdomen is soft, non-tender, non-distended, normal bowel sounds, no hepatosplenomegaly MSK: extremities are warm, no edema.  Skin: warm, no rash Neuro:  no focal deficits Psych: appropriate affect   Diagnostic Studies     Assessment and  Plan   1. History of CAD - remote MI as reported above - no symptoms, continue current meds - EKG today shows SR, chronic ST/T changes   2. HTN - at goal, continue current meds   3. Hyperlipidemia - LDL at goal, continue atorvastatin and zetia  F/u 1 year    Arnoldo Lenis, M.D.

## 2022-12-08 NOTE — Patient Instructions (Signed)
Medication Instructions:  Your physician recommends that you continue on your current medications as directed. Please refer to the Current Medication list given to you today.   Labwork: None  Testing/Procedures: None  Follow-Up: Follow up with Dr. Branch in 1 year.   Any Other Special Instructions Will Be Listed Below (If Applicable).     If you need a refill on your cardiac medications before your next appointment, please call your pharmacy.  

## 2022-12-16 ENCOUNTER — Ambulatory Visit (INDEPENDENT_AMBULATORY_CARE_PROVIDER_SITE_OTHER): Payer: PPO | Admitting: Urology

## 2022-12-16 ENCOUNTER — Encounter: Payer: Self-pay | Admitting: Urology

## 2022-12-16 ENCOUNTER — Telehealth: Payer: Self-pay

## 2022-12-16 ENCOUNTER — Other Ambulatory Visit: Payer: Self-pay

## 2022-12-16 ENCOUNTER — Ambulatory Visit (HOSPITAL_COMMUNITY)
Admission: RE | Admit: 2022-12-16 | Discharge: 2022-12-16 | Disposition: A | Discharge status: Home / Self Care | Payer: PPO | Source: Ambulatory Visit | Attending: Urology | Admitting: Urology

## 2022-12-16 VITALS — BP 118/67 | HR 61

## 2022-12-16 DIAGNOSIS — N3001 Acute cystitis with hematuria: Secondary | ICD-10-CM | POA: Diagnosis not present

## 2022-12-16 DIAGNOSIS — N200 Calculus of kidney: Secondary | ICD-10-CM | POA: Insufficient documentation

## 2022-12-16 LAB — URINALYSIS, ROUTINE W REFLEX MICROSCOPIC
Bilirubin, UA: NEGATIVE
Glucose, UA: NEGATIVE
Ketones, UA: NEGATIVE
Nitrite, UA: NEGATIVE
Specific Gravity, UA: 1.01 (ref 1.005–1.030)
Urobilinogen, Ur: 0.2 mg/dL (ref 0.2–1.0)
pH, UA: 5.5 (ref 5.0–7.5)

## 2022-12-16 LAB — MICROSCOPIC EXAMINATION
RBC, Urine: >30 /hpf — AB (ref 0–2)
WBC, UA: >30 /hpf — AB (ref 0–5)

## 2022-12-16 NOTE — Telephone Encounter (Signed)
Patient is aware to get her KUB done before scheduled appt today. Patient voiced understanding

## 2022-12-16 NOTE — Progress Notes (Signed)
12/16/2022 3:10 PM   Leah [name] 1956/09/17 [id number]  Referring provider: Juanda Chance, [place] [address] [place],  [place] [address]  nephrolithiasis   HPI: Ms Deol is a 67yo here for evaluation of nephrolithiasis. She has a history of nephrolithiasis and underwent right PCNL in 12/2015. She had worsening right flank pain and underwent CT 2 weeks ago that shows 2cm left renal pelvis stone and 2 large 2-3cm right UPJ calculi. She has intermittent right flank pain that is mild. UA is concerning for infection   PMH: Past Medical History:  Diagnosis Date   Arthritis    Borderline diabetes    Breast lump on right side at 3 o'clock position    Benign, fatty mass   History of kidney stones    History of shingles    Hyperlipidemia    Hypertension    Myocardial infarction ([place]) 1996    HAD ANGIOPLASTY WITH 2 STENTS   Tinnitus    right ear    Vitamin D deficiency 10/17/2019   Wears glasses     Surgical History: Past Surgical History:  Procedure Laterality Date   BREAST LUMPECTOMY  >10 YRS AGO   RT BREAST   COLONOSCOPY WITH PROPOFOL N/A 04/22/2021   Procedure: COLONOSCOPY WITH PROPOFOL;  Surgeon: Eloise Harman, DO;  [facility];  Service: Endoscopy;  Laterality: N/A;  11:45am   CORONARY ANGIOPLASTY  1996   ANGIOPLASTY / 2 STENTS   CYSTOSCOPY WITH STENT PLACEMENT Right 12/30/2015   Procedure: CYSTOSCOPY;  Surgeon: Cleon Gustin, MD;  [facility];  Service: Urology;  Laterality: Right;   CYSTOSCOPY WITH STENT PLACEMENT Right 01/09/2016   Procedure: CYSTOSCOPY WITH STENT PLACEMENT;  Surgeon: Cleon Gustin, MD;  [facility];  Service: Urology;  Laterality: Right;   HOLMIUM LASER APPLICATION Right 4/85/4627   Procedure: HOLMIUM LASER APPLICATION;  Surgeon: Cleon Gustin, MD;  [facility];  Service: Urology;  Laterality: Right;   LITHOTRIPSY     NEPHROLITHOTOMY Right 12/30/2015   Procedure: RIght Nephrostomy with  access;  Surgeon: Cleon Gustin, MD;  [facility];  Service: Urology;  Laterality: Right;   NEPHROLITHOTOMY Right 01/09/2016   Procedure: RIGHT PERCUTANEOUS NEPHROLITHOTOMY SECOND LOOK , POSSIBLE DILATION OF TRACT;  Surgeon: Cleon Gustin, MD;  [facility];  Service: Urology;  Laterality: Right;   POLYPECTOMY  04/22/2021   Procedure: POLYPECTOMY;  Surgeon: Eloise Harman, DO;  [facility];  Service: Endoscopy;;    Home Medications:  Allergies as of 12/16/2022       Reactions   Codeine    REACTION: " Makes me high and blacked out in the 1980s"        Medication List        Accurate as of December 16, 2022  3:10 PM. If you have any questions, ask your nurse or doctor.          albuterol 108 (90 Base) MCG/ACT inhaler Commonly known as: VENTOLIN HFA Inhale 2 puffs into the lungs every 6 (six) hours as needed for wheezing or shortness of breath.   amLODipine 10 MG tablet Commonly known as: NORVASC TAKE ONE TABLET BY MOUTH ONCE DAILY.   aspirin EC 81 MG tablet Take 81 mg by mouth every morning.   atorvastatin 80 MG tablet Commonly known as: LIPITOR TAKE 1 TABLET BY MOUTH DAILY.   Blood Pressure Monitor Automat Devi 1 Device by Does not apply  route daily. Check blood pressure daily   ezetimibe 10 MG tablet Commonly known as: ZETIA TAKE ONE TABLET BY MOUTH ONCE DAILY.   hydrochlorothiazide 12.5 MG tablet Commonly known as: HYDRODIURIL Take 1 tablet (12.5 mg total) by mouth daily.   ibuprofen 200 MG tablet Commonly known as: ADVIL Take 400 mg by mouth every 6 (six) hours as needed for headache or moderate pain.   metoprolol tartrate 50 MG tablet Commonly known as: LOPRESSOR TAKE 1 TABLET BY MOUTH ONCE A DAY.   multivitamin with minerals Tabs tablet Take 1 tablet by mouth every morning.   nicotine 14 mg/24hr patch Commonly known as: NICODERM CQ - dosed in mg/24 hours Place 1 patch (14 mg total) onto the skin daily.   olmesartan  20 MG tablet Commonly known as: BENICAR TAKE (1) TABLET BY MOUTH ONCE DAILY.   Omega 3 1000 MG Caps Take 1,000 mg by mouth daily.   vitamin B-12 500 MCG tablet Commonly known as: CYANOCOBALAMIN Take 500 mcg by mouth daily.   Vitamin D2 50 MCG (2000 UT) Tabs Take 2,000 Units by mouth daily.   vitamin E 180 MG (400 UNITS) capsule Take 400 Units by mouth daily.        Allergies:  Allergies  Allergen Reactions   Codeine     REACTION: " Makes me high and blacked out in the 1980s"    Family History: Family History  Problem Relation Age of Onset   Heart disease Mother    Diabetes Mother    Cancer Father        bone   Cirrhosis Father    Stroke Maternal Grandmother    Stroke Maternal Grandfather    Heart disease Sister    Obesity Brother    Healthy Daughter     Social History:  reports that she has been smoking cigarettes. She has a 40.00 pack-year smoking history. She has never used smokeless tobacco. She reports that she does not drink alcohol and does not use drugs.  ROS: All other review of systems were reviewed and are negative except what is noted above in HPI  Physical Exam: BP 118/67   Pulse 61   LMP 05/16/2002   Constitutional:  Alert and oriented, No acute distress. HEENT: [place] AT, moist mucus membranes.  Trachea midline, no masses. Cardiovascular: No clubbing, cyanosis, or edema. Respiratory: Normal respiratory effort, no increased work of breathing. GI: Abdomen is soft, nontender, nondistended, no abdominal masses GU: No CVA tenderness.  Lymph: No cervical or inguinal lymphadenopathy. Skin: No rashes, bruises or suspicious lesions. Neurologic: Grossly intact, no focal deficits, moving all 4 extremities. Psychiatric: Normal mood and affect.  Laboratory Data: Lab Results  Component Value Date   WBC 5.9 03/12/2022   HGB 14.6 03/12/2022   HCT 44.2 03/12/2022   MCV 88 03/12/2022   PLT 212 03/12/2022    Lab Results  Component Value Date    CREATININE 1.05 (H) 09/11/2022    No results found for: "PSA"  No results found for: "TESTOSTERONE"  Lab Results  Component Value Date   HGBA1C 6.4 (H) 09/11/2022    Urinalysis    Component Value Date/Time   COLORURINE orange 07/31/2008 1050   APPEARANCEUR Clear 07/31/2008 1050   LABSPEC 1.015 07/31/2008 1050   PHURINE 6.5 07/31/2008 1050   GLUCOSEU NEG mg/dL 07/14/2008 0138   HGBUR large 07/31/2008 1050   BILIRUBINUR small 07/31/2008 1050   KETONESUR NEG mg/dL 07/14/2008 0138   PROTEINUR NEG mg/dL 07/14/2008 0138  UROBILINOGEN 1.0 07/31/2008 1050   NITRITE negative 07/31/2008 1050   LEUKOCYTESUR NEG 07/14/2008 0138    Lab Results  Component Value Date   LABMICR 88.8 09/11/2022   BACTERIA RARE 07/14/2008    Pertinent Imaging: CT 11/30/2022 and KUB today: Images reviewed and discussed the patient  Results for orders placed during the hospital encounter of 01/15/17  DG Abd 1 View  Narrative CLINICAL DATA:  Kidney stones.  EXAM: ABDOMEN - 1 VIEW  COMPARISON:  CT 01/23/2016.  FINDINGS: Prominent calcific densities noted over the right kidney consistent with nephrolithiasis. Small calcific densities noted over the left kidney suggesting nephrolithiasis. Punctate calcific densities in the pelvis most likely phleboliths. No bowel distention. Stool noted throughout the colon. No acute bony abnormality .  IMPRESSION: Prominent calcific densities both kidneys consistent with bilateral nephrolithiasis . Small calcific densities in pelvis most likely phleboliths.   Electronically Signed By: Marcello Moores  Register On: 01/15/2017 14:37  No results found for this or any previous visit.  No results found for this or any previous visit.  No results found for this or any previous visit.  Results for orders placed during the hospital encounter of 01/15/17  US Renal  Narrative CLINICAL DATA:  Follow-up right kidney stone. History of cystoscopy, nephrolithotomy on  the right.  EXAM: RENAL / URINARY TRACT ULTRASOUND COMPLETE  COMPARISON:  06/22/2016, 01/15/2017, and CT 01/23/2016 and 06/26/2002  FINDINGS: Right Kidney:  Length: 9.7 cm. No hydronephrosis. Echogenic, shadowing foci are identified in the central renal pelvis, measuring 1.0 and 1.5 cm. A small lower pole calcification is 9 mm. No solid renal mass.  Left Kidney:  Length: 10.1 cm. No hydronephrosis. Lower pole echogenic shadowing foci and is 1.3 cm. No cystic or solid mass.  Bladder:  Appears normal for degree of bladder distention. Bilateral ureteral jets are present.  Additional  Right hepatic lobe lesion is homogeneously echogenic and circumscribed and measures 1.4 x 1.4 x 1.7 cm. Findings are consistent with benign hepatic hemangioma when compared with multiple prior studies.  IMPRESSION: 1. Bilateral nephrolithiasis. 2. No hydronephrosis. 3. Benign right hepatic hemangioma.   Electronically Signed By: Nolon Nations M.D. On: 01/15/2017 14:57  No valid procedures specified. No results found for this or any previous visit.  No results found for this or any previous visit.   Assessment & Plan:    1. Kidney stones -We discussed the management of kidney stones. These options include observation, ureteroscopy, shockwave lithotripsy (ESWL) and percutaneous nephrolithotomy (PCNL). We discussed which options are relevant to the patient's stone(s). We discussed the natural history of kidney stones as well as the complications of untreated stones and the impact on quality of life without treatment as well as with each of the above listed treatments. We also discussed the efficacy of each treatment in its ability to clear the stone burden. With any of these management options I discussed the signs and symptoms of infection and the need for emergent treatment should these be experienced. For each option we discussed the ability of each procedure to clear the patient of  their stone burden.   For observation I described the risks which include but are not limited to silent renal damage, life-threatening infection, need for emergent surgery, failure to pass stone and pain.   For ureteroscopy I described the risks which include bleeding, infection, damage to contiguous structures, positioning injury, ureteral stricture, ureteral avulsion, ureteral injury, need for prolonged ureteral stent, inability to perform ureteroscopy, need for an interval procedure, inability  to clear stone burden, stent discomfort/pain, heart attack, stroke, pulmonary embolus and the inherent risks with general anesthesia.   For shockwave lithotripsy I described the risks which include arrhythmia, kidney contusion, kidney hemorrhage, need for transfusion, pain, inability to adequately break up stone, inability to pass stone fragments, Steinstrasse, infection associated with obstructing stones, need for alternate surgical procedure, need for repeat shockwave lithotripsy, MI, CVA, PE and the inherent risks with anesthesia/conscious sedation.   For PCNL I described the risks including positioning injury, pneumothorax, hydrothorax, need for chest tube, inability to clear stone burden, renal laceration, arterial venous fistula or malformation, need for embolization of kidney, loss of kidney or renal function, need for repeat procedure, need for prolonged nephrostomy tube, ureteral avulsion, MI, CVA, PE and the inherent risks of general anesthesia.   - The patient would like to proceed with Right PCNL - Urinalysis, Routine w reflex microscopic   No follow-ups on file.  Nicolette Bang, MD  [facility] [place]

## 2022-12-16 NOTE — Patient Instructions (Signed)
Percutaneous Nephrolithotomy Percutaneous nephrolithotomy is a procedure to remove kidney stones. Kidney stones are deposits that form inside your kidneys and can cause pain. You may need this procedure if: You have large kidney stones. Kidney stones that are bigger than 2 cm (0.78 inch) wide may require this procedure. Your kidney stones are oddly shaped. Other treatments have not been successful in helping the kidney stones to pass. You have developed an infection due to the kidney stones. Tell a health care provider about: Any allergies you have. All medicines you are taking, including vitamins, herbs, eye drops, creams, and over-the-counter medicines. Any problems you or family members have had with anesthetic medicines. Any blood disorders you have. Any surgeries you have had. Any medical conditions you have. Whether you are pregnant or may be pregnant. Whether you use any tobacco products, including cigarettes, chewing tobacco, or e-cigarettes. What are the risks? Generally, this is a safe procedure. However, problems may occur, including: Infection. Bleeding. This may include blood in your urine. Allergic reactions to medicines. Damage to other structures or organs. Kidney damage. Holes in the kidney. These often heal on their own. Numbness or tingling in the affected area. Inability to remove all the stones. You may need a different procedure to complete treatment. What happens before the procedure? Staying hydrated Follow instructions from your health care provider about hydration, which may include: Up to 2 hours before the procedure - you may continue to drink clear liquids, such as water, clear fruit juice, black coffee, and plain tea.  Eating and drinking restrictions Follow instructions from your health care provider about eating and drinking, which may include: 8 hours before the procedure - stop eating heavy meals or foods, such as meat, fried foods, or fatty foods. 6  hours before the procedure - stop eating light meals or foods, such as toast or cereal. 6 hours before the procedure - stop drinking milk or drinks that contain milk. 2 hours before the procedure - stop drinking clear liquids. Medicines Ask your health care provider about: Changing or stopping your regular medicines. This is especially important if you are taking diabetes medicines or blood thinners. Taking medicines such as aspirin and ibuprofen. These medicines can thin your blood. Do not take these medicines unless your health care provider tells you to take them. Taking over-the-counter medicines, vitamins, herbs, and supplements. Tests You may have tests, including: Blood tests. Urine tests. Tests to check how your heart is working. Imaging studies. These are used to identify: The size and number (stone burden) of the kidney stones. The position of the kidney stones. General instructions Plan to have someone take you home from the hospital or clinic. Plan to have a responsible adult care for you for at least 24 hours after you leave the hospital or clinic. This is important. Ask your health care provider how your surgical site will be marked or identified. Ask your health care provider what steps will be taken to help prevent infection. These may include: Removing hair at the surgery site. Washing skin with a germ-killing soap. Taking antibiotic medicine. What happens during the procedure?  An IV will be inserted into one of your veins. The site of the procedure will be marked. You will be given one or more of the following: A medicine to help you relax (sedative). A medicine to numb the area (local anesthetic). A medicine to make you fall asleep (general anesthetic). A medicine that is injected into your spine to numb the area below   and slightly above the injection site (spinal anesthetic). A medicine that is injected into an area of your body to numb everything below the  injection site (regional anesthetic). A thin tube (urinary catheter) will be put in your bladder to drain urine during and after the procedure. Your surgeon will make a small cut (incision) in your lower back. A tube will be inserted through the incision into your kidney. Each kidney stone will be removed through this tube. Larger stones may need to be broken up with a high-intensity light beam (laser) or other tools. After all of the stones have been removed, your health care provider may put in tubes to drain your bladder. Based on your condition: An internal tube, called a stent, may be put in your ureter. This will help drain urine from your kidney to your bladder. A surgical drain (nephrostomy tube) may be put in your kidney. The tube comes out through the incision in your lower back. This will help to drain urine or any fluid that builds up while your kidney heals. Part of the incision may be closed with stitches (sutures). A bandage (dressing) will be placed over the incision area. The procedure may vary among health care providers and hospitals. What happens after the procedure? Your blood pressure, heart rate, breathing rate, and blood oxygen level will be monitored until you leave the hospital or clinic. You may be given medicine for pain. You will be shown how to do breathing exercises, such as coughing and breathing deeply. These will help to prevent pneumonia. You will be encouraged to walk. Walking helps to prevent blood clots. Your stent and urinary catheter will be removed after 1-2 days if there is only a small amount of blood in your urine. You will be taught how to care for the catheter or nephrostomy tube, if you have them. Do not drive for 24 hours if you were given a sedative during your procedure. Summary Percutaneous nephrolithotomy is a procedure to remove kidney stones. Ask your health care provider about changing or stopping your regular medicines. Before surgery,  follow instructions from your health care provider about eating and drinking. Plan to have someone take you home from the hospital or clinic. This information is not intended to replace advice given to you by your health care provider. Make sure you discuss any questions you have with your health care provider. Document Revised: 07/06/2021 Document Reviewed: 07/07/2021 Elsevier Patient Education  2023 [employer]  

## 2022-12-18 ENCOUNTER — Telehealth: Payer: Self-pay

## 2022-12-18 ENCOUNTER — Other Ambulatory Visit: Payer: Self-pay

## 2022-12-18 DIAGNOSIS — N200 Calculus of kidney: Secondary | ICD-10-CM

## 2022-12-18 LAB — URINE CULTURE

## 2022-12-18 NOTE — Telephone Encounter (Signed)
I spoke with Leah [name]. We have discussed possible surgery dates and [id number] was agreed upon by all parties. Patient given information about surgery date, what to expect pre-operatively and post operatively.    We discussed that a pre-op nurse will be calling to set up the pre-op visit that will take place prior to surgery. Informed patient that our office will communicate any additional care to be provided after surgery.    Patients questions or concerns were discussed during our call. Advised to call our office should there be any additional information, questions or concerns that arise. Patient verbalized understanding.

## 2023-01-11 ENCOUNTER — Other Ambulatory Visit: Payer: Self-pay | Admitting: Family Medicine

## 2023-01-11 ENCOUNTER — Other Ambulatory Visit: Payer: Self-pay | Admitting: Internal Medicine

## 2023-01-11 DIAGNOSIS — I10 Essential (primary) hypertension: Secondary | ICD-10-CM

## 2023-01-11 DIAGNOSIS — I2510 Atherosclerotic heart disease of native coronary artery without angina pectoris: Secondary | ICD-10-CM

## 2023-01-12 ENCOUNTER — Telehealth (INDEPENDENT_AMBULATORY_CARE_PROVIDER_SITE_OTHER): Payer: PPO | Admitting: Internal Medicine

## 2023-01-12 ENCOUNTER — Encounter: Payer: Self-pay | Admitting: Internal Medicine

## 2023-01-12 DIAGNOSIS — E782 Mixed hyperlipidemia: Secondary | ICD-10-CM | POA: Diagnosis not present

## 2023-01-12 DIAGNOSIS — I10 Essential (primary) hypertension: Secondary | ICD-10-CM | POA: Diagnosis not present

## 2023-01-12 DIAGNOSIS — I700 Atherosclerosis of aorta: Secondary | ICD-10-CM | POA: Diagnosis not present

## 2023-01-12 DIAGNOSIS — I2510 Atherosclerotic heart disease of native coronary artery without angina pectoris: Secondary | ICD-10-CM

## 2023-01-12 DIAGNOSIS — R7303 Prediabetes: Secondary | ICD-10-CM | POA: Diagnosis not present

## 2023-01-12 DIAGNOSIS — N200 Calculus of kidney: Secondary | ICD-10-CM

## 2023-01-12 DIAGNOSIS — N1831 Chronic kidney disease, stage 3a: Secondary | ICD-10-CM | POA: Diagnosis not present

## 2023-01-12 DIAGNOSIS — J439 Emphysema, unspecified: Secondary | ICD-10-CM

## 2023-01-12 NOTE — Assessment & Plan Note (Signed)
Last 2 CMP showed GFR 48 -> 58 Avoid nephrotoxic agents Needs to improve fluid intake On Olmesartan and HCTZ Checked urine microalbumin/creatinine ratio

## 2023-01-12 NOTE — Assessment & Plan Note (Signed)
Check HbA1c Continue to follow low-carb diet and ambulate as tolerated

## 2023-01-12 NOTE — Assessment & Plan Note (Signed)
S/p stent placement, followed by cardiology On aspirin and statin On metoprolol No anginal symptoms currently

## 2023-01-12 NOTE — Assessment & Plan Note (Signed)
On statin, Zetia and omega-3 Check lipid profile

## 2023-01-12 NOTE — Progress Notes (Signed)
Virtual Visit via Video Note   Because of Leah [name]'s co-morbid illnesses, she is at least at moderate risk for complications without adequate follow up.  This format is felt to be most appropriate for this patient at this time.  All issues noted in this document were discussed and addressed.  A limited physical exam was performed with this format.      Evaluation Performed:  Follow-up visit  Date:  01/12/2023   ID:  Leah [name], Leah [name] 1956/02/01, MRN [id number]  Patient Location: Home Provider Location: Office/Clinic  Participants: Patient Location of Patient: Home Location of Provider: Telehealth Consent was obtain for visit to be over via telehealth. I verified that I am speaking with the correct person using two identifiers.  PCP:  Leah Spar, MD   Chief Complaint: Follow up of chronic medical conditions  History of Present Illness:    Leah [name] is a 67 y.o. female with past medical history of CAD s/p stent placement, HTN, CKD, emphysema, HLD, OA and tobacco abuse who presents for f/u of her chronic medical conditions.  She was having right sided flank pain in 01/24, and was found to have nephrolithiasis on CT abdomen.  She has seen urology for it.  She is planned to have nephrolithotomy for it.  She currently denies any acute dysuria, hematuria or flank pain.   CAD: She has had stent placed to RCA in 1998.  She denies any anginal pain, dyspnea or palpitations currently.  She is on aspirin, statin, Zetia and metoprolol currently.  She follows up with cardiology. BP is well-controlled. Takes medications regularly. Patient denies headache, dizziness, chest pain, dyspnea or palpitations.   CKD: Her last BMP showed GFR 58.  Her ALP was also elevated in 04/23.  She denies any dysuria, hematuria or urinary retention or resistance.  She takes vitamin D, vitamin B12 and vitamin D supplements.   She also reports history of prediabetes.  Denies any fatigue, polyuria  or polydipsia.  The patient does not have symptoms concerning for COVID-19 infection (fever, chills, cough, or new shortness of breath).   Past Medical, Surgical, Social History, Allergies, and Medications have been Reviewed.  Past Medical History:  Diagnosis Date   Arthritis    Borderline diabetes    Breast lump on right side at 3 o'clock position    Benign, fatty mass   History of kidney stones    History of shingles    Hyperlipidemia    Hypertension    Myocardial infarction ([place]) 1996    HAD ANGIOPLASTY WITH 2 STENTS   Tinnitus    right ear    Vitamin D deficiency 10/17/2019   Wears glasses    Past Surgical History:  Procedure Laterality Date   BREAST LUMPECTOMY  >10 YRS AGO   RT BREAST   COLONOSCOPY WITH PROPOFOL N/A 04/22/2021   Procedure: COLONOSCOPY WITH PROPOFOL;  Surgeon: Eloise Harman, DO;  Location: [facility];  Service: Endoscopy;  Laterality: N/A;  11:45am   CORONARY ANGIOPLASTY  1996   ANGIOPLASTY / 2 STENTS   CYSTOSCOPY WITH STENT PLACEMENT Right 12/30/2015   Procedure: CYSTOSCOPY;  Surgeon: Cleon Gustin, MD;  Location: [facility];  Service: Urology;  Laterality: Right;   CYSTOSCOPY WITH STENT PLACEMENT Right 01/09/2016   Procedure: CYSTOSCOPY WITH STENT PLACEMENT;  Surgeon: Cleon Gustin, MD;  Location: [facility];  Service: Urology;  Laterality: Right;   HOLMIUM LASER APPLICATION Right [id number]   Procedure: HOLMIUM  LASER APPLICATION;  Surgeon: Cleon Gustin, MD;  Location: [facility];  Service: Urology;  Laterality: Right;   LITHOTRIPSY     NEPHROLITHOTOMY Right 12/30/2015   Procedure: RIght Nephrostomy with access;  Surgeon: Cleon Gustin, MD;  Location: [facility];  Service: Urology;  Laterality: Right;   NEPHROLITHOTOMY Right 01/09/2016   Procedure: RIGHT PERCUTANEOUS NEPHROLITHOTOMY SECOND LOOK , POSSIBLE DILATION OF TRACT;  Surgeon: Cleon Gustin, MD;  Location: [facility];  Service: Urology;  Laterality: Right;   POLYPECTOMY  04/22/2021    Procedure: POLYPECTOMY;  Surgeon: Eloise Harman, DO;  Location: [facility];  Service: Endoscopy;;     No outpatient medications have been marked as taking for the 01/12/23 encounter (Video Visit) with Leah Spar, MD.     Allergies:   Codeine   ROS:   Please see the history of present illness.     All other systems reviewed and are negative.   Labs/Other Tests and Data Reviewed:    Recent Labs: 03/12/2022: ALT 24; Hemoglobin 14.6; Platelets 212; TSH 2.660 09/11/2022: BUN 11; Creatinine, Ser 1.05; Potassium CANCELED; Sodium 139   Recent Lipid Panel Lab Results  Component Value Date/Time   CHOL 132 03/12/2022 11:37 AM   TRIG 64 03/12/2022 11:37 AM   HDL 49 03/12/2022 11:37 AM   CHOLHDL 2.7 03/12/2022 11:37 AM   CHOLHDL 3.1 12/11/2020 12:00 AM   LDLCALC 70 03/12/2022 11:37 AM   LDLCALC 85 12/11/2020 12:00 AM    Wt Readings from Last 3 Encounters:  12/08/22 175 lb (79.4 kg)  11/26/22 174 lb (78.9 kg)  09/11/22 173 lb 6.4 oz (78.7 kg)     Objective:    Vital Signs:  LMP 05/16/2002    VITAL SIGNS:  reviewed GEN:  no acute distress EYES:  sclerae anicteric, EOMI - Extraocular Movements Intact RESPIRATORY:  normal respiratory effort, symmetric expansion NEURO:  alert and oriented x 3, no obvious focal deficit PSYCH:  normal affect  ASSESSMENT & PLAN:    Essential hypertension BP Readings from Last 1 Encounters:  12/16/22 118/67   Well-controlled with amlodipine, HCTZ and Olmesartan Counseled for compliance with the medications Advised DASH diet and moderate exercise/walking, at least 150 mins/week  Coronary artery disease involving native heart without angina pectoris S/p stent placement, followed by cardiology On aspirin and statin On metoprolol No anginal symptoms currently  Aortic atherosclerosis (HCC) Noted on CT chest On aspirin and statin  Pulmonary emphysema (HCC) Noted on CT chest Asymptomatic currently Advised to cut down -> quit  smoking  Stage 3a chronic kidney disease (HCC) Last 2 CMP showed GFR 48 -> 58 Avoid nephrotoxic agents Needs to improve fluid intake On Olmesartan and HCTZ Checked urine microalbumin/creatinine ratio  Nephrolithiasis Followed by Urology Planned for nephrolithotomy  Hyperlipidemia On statin, Zetia and omega-3 Check lipid profile  Prediabetes Check HbA1c Continue to follow low-carb diet and ambulate as tolerated   I discussed the assessment and treatment plan with the patient. The patient was provided an opportunity to ask questions, and all were answered. The patient agreed with the plan and demonstrated an understanding of the instructions.   The patient was advised to call back or seek an in-person evaluation if the symptoms worsen or if the condition fails to improve as anticipated.  The above assessment and management plan was discussed with the patient. The patient verbalized understanding of and has agreed to the management plan.   Medication Adjustments/Labs and Tests Ordered: Current medicines are reviewed at  length with the patient today.  Concerns regarding medicines are outlined above.   Tests Ordered: No orders of the defined types were placed in this encounter.   Medication Changes: No orders of the defined types were placed in this encounter.    Note: This dictation was prepared with Dragon dictation along with smaller phrase technology. Similar sounding words can be transcribed inadequately or may not be corrected upon review. Any transcriptional errors that result from this process are unintentional.      Disposition:  Follow up  Signed, Leah Spar, MD  01/12/2023 8:55 AM     [place]

## 2023-01-12 NOTE — Assessment & Plan Note (Signed)
Followed by Urology Planned for nephrolithotomy

## 2023-01-12 NOTE — Patient Instructions (Signed)
Please continue to take medications as prescribed.  Please continue to follow low carb diet and perform moderate exercise/walking at least 150 mins/week.  Please get fasting blood tests done within a week.

## 2023-01-12 NOTE — Assessment & Plan Note (Signed)
Noted on CT chest Asymptomatic currently Advised to cut down -> quit smoking

## 2023-01-12 NOTE — Assessment & Plan Note (Signed)
BP Readings from Last 1 Encounters:  12/16/22 118/67   Well-controlled with amlodipine, HCTZ and Olmesartan Counseled for compliance with the medications Advised DASH diet and moderate exercise/walking, at least 150 mins/week

## 2023-01-12 NOTE — Assessment & Plan Note (Signed)
Noted on CT chest On aspirin and statin

## 2023-01-14 DIAGNOSIS — R7303 Prediabetes: Secondary | ICD-10-CM | POA: Diagnosis not present

## 2023-01-14 DIAGNOSIS — E782 Mixed hyperlipidemia: Secondary | ICD-10-CM | POA: Diagnosis not present

## 2023-01-15 LAB — LIPID PANEL
Chol/HDL Ratio: 2.8 ratio (ref 0.0–4.4)
Cholesterol, Total: 139 mg/dL (ref 100–199)
HDL: 50 mg/dL (ref >39)
LDL Chol Calc (NIH): 75 mg/dL (ref 0–99)
Triglycerides: 68 mg/dL (ref 0–149)
VLDL Cholesterol Cal: 14 mg/dL (ref 5–40)

## 2023-01-15 LAB — CMP14+EGFR
ALT: 20 IU/L (ref 0–32)
AST: 22 IU/L (ref 0–40)
Albumin/Globulin Ratio: 1.5 (ref 1.2–2.2)
Albumin: 4.2 g/dL (ref 3.9–4.9)
Alkaline Phosphatase: 144 IU/L — ABNORMAL HIGH (ref 44–121)
BUN/Creatinine Ratio: 10 — ABNORMAL LOW (ref 12–28)
BUN: 10 mg/dL (ref 8–27)
Bilirubin Total: 0.4 mg/dL (ref 0.0–1.2)
CO2: 25 mmol/L (ref 20–29)
Calcium: 9.4 mg/dL (ref 8.7–10.3)
Chloride: 102 mmol/L (ref 96–106)
Creatinine, Ser: 1 mg/dL (ref 0.57–1.00)
Globulin, Total: 2.8 g/dL (ref 1.5–4.5)
Glucose: 109 mg/dL — ABNORMAL HIGH (ref 70–99)
Potassium: 4.2 mmol/L (ref 3.5–5.2)
Sodium: 141 mmol/L (ref 134–144)
Total Protein: 7 g/dL (ref 6.0–8.5)
eGFR: 61 mL/min/{1.73_m2} (ref >59)

## 2023-01-15 LAB — HEMOGLOBIN A1C
Est. average glucose Bld gHb Est-mCnc: 137 mg/dL
Hgb A1c MFr Bld: 6.4 % — ABNORMAL HIGH (ref 4.8–5.6)

## 2023-01-29 NOTE — Patient Instructions (Signed)
Leah [name]  01/29/2023     @PREFPERIOPPHARMACY @   Your procedure is scheduled on  02/04/2023.   Report to [facility] at  0600  A.M.   Call this number if you have problems the morning of surgery:  [phone]  If you experience any cold or flu symptoms such as cough, fever, chills, shortness of breath, etc. between now and your scheduled surgery, please notify us at the above number.   Remember:  Do not eat or drink after midnight.     Take these medicines the morning of surgery with A SIP OF WATER                             amlodipine, metoprolol.     Do not wear jewelry, make-up or nail polish.  Do not wear lotions, powders, or perfumes, or deodorant.  Do not shave 48 hours prior to surgery.  Men may shave face and neck.  Do not bring valuables to the hospital.  [facility] is not responsible for any belongings or valuables.  Contacts, dentures or bridgework may not be worn into surgery.  Leave your suitcase in the car.  After surgery it may be brought to your room.  For patients admitted to the hospital, discharge time will be determined by your treatment team.  Patients discharged the day of surgery will not be allowed to drive home and must have someone with them for 24 hours.    Special instructions:   DO NOT smoke tobacco or vape for 24 hours before your procedure.  Please read over the following fact sheets that you were given. Coughing and Deep Breathing, Surgical Site Infection Prevention, Anesthesia Post-op Instructions, and Care and Recovery After Surgery      Percutaneous Nephrolithotomy, Care After After percutaneous nephrolithotomy, it is common to have: Soreness or pain. A small amount of blood or clear fluid coming from your incision for a few days. Tiredness (fatigue). Some blood in your pee (urine). This will last for a few days. A feeling of needing to pee (urinate) often. It may feel urgent. You may have this if you have a  small mesh tube (stent) in the part of your body that connects your bladder to your kidneys (ureter). Follow these instructions at home: Medicines Take over-the-counter and prescription medicines only as told by your health care provider. If you were prescribed antibiotics, take them as told by your provider. Do not stop using the antibiotic even if you start to feel better. Ask your provider if the medicine prescribed to you: Requires you to avoid driving or using machinery. Can cause constipation. You may need to take these actions to prevent or treat constipation: Drink enough fluid to keep your pee pale yellow. Take over-the-counter or prescription medicines. Eat foods that are high in fiber, such as beans, whole grains, and fresh fruits and vegetables. Limit foods that are high in fat and processed sugars, such as fried or sweet foods. Incision care  Follow instructions from your provider about how to take care of your incision. Make sure you: Wash your hands with soap and water for at least 20 seconds before and after you change your bandage (dressing). If soap and water are not available, use hand sanitizer. Change your dressing as told by your provider. Leave stitches (sutures), skin glue, or tape strips in place. These skin closures may need to stay  in place for 2 weeks or longer. If tape strip edges start to loosen and curl up, you may trim the loose edges. Do not remove tape strips completely unless your provider tells you to do that. Check your incision area every day for signs of infection. Check for: More redness, swelling, or pain. More fluid or blood. Warmth. Pus or a bad smell. Do not take baths, swim, or use a hot tub until your provider approves. Ask your provider if you may take showers. You may only be allowed to take sponge baths. Activity Avoid activities that take a lot of effort for as long as told by your provider. Return to your normal activities as told by your  provider. Ask your provider what activities are safe for you. General instructions If you were sent home with a soft tube (catheter) or a surgical drain (nephrostomy tube), follow your provider's instructions on how to take care of it. Wear compression stockings as told by your provider. These stockings help to prevent blood clots and reduce swelling in your legs. Do not use any products that contain nicotine or tobacco. These products include cigarettes, chewing tobacco, and vaping devices, such as e-cigarettes. These can delay incision healing after surgery. If you need help quitting, ask your provider. Keep all follow-up visits. If you have a stent, it will need to be removed after 1-2 weeks. Your provider may give you more instructions. Make sure you know what you can and cannot do. Contact a health care provider if: You have a fever. Your incision shows any signs of infection. You lose your appetite, feel nauseous, or vomit. You have a catheter, stent, or drain and the flow of pee stops all of a sudden. Then you have pain in your kidney. Get help right away if: There is a lot of blood in your pee. You have blood clots in your pee. You cannot pee. You have chest pain or trouble breathing. These symptoms may be an emergency. Get help right away. Call 911. Do not wait to see if the symptoms will go away. Do not drive yourself to the hospital. This information is not intended to replace advice given to you by your health care provider. Make sure you discuss any questions you have with your health care provider. Document Revised: 07/02/2022 Document Reviewed: 07/02/2022 Elsevier Patient Education  [place] Anesthesia, Adult, Care After The following information offers guidance on how to care for yourself after your procedure. Your health care provider may also give you more specific instructions. If you have problems or questions, contact your health care provider. What  can I expect after the procedure? After the procedure, it is common for people to: Have pain or discomfort at the IV site. Have nausea or vomiting. Have a sore throat or hoarseness. Have trouble concentrating. Feel cold or chills. Feel weak, sleepy, or tired (fatigue). Have soreness and body aches. These can affect parts of the body that were not involved in surgery. Follow these instructions at home: For the time period you were told by your health care provider:  Rest. Do not participate in activities where you could fall or become injured. Do not drive or use machinery. Do not drink alcohol. Do not take sleeping pills or medicines that cause drowsiness. Do not make important decisions or sign legal documents. Do not take care of children on your own. General instructions Drink enough fluid to keep your urine pale yellow. If you have sleep apnea, surgery and  certain medicines can increase your risk for breathing problems. Follow instructions from your health care provider about wearing your sleep device: Anytime you are sleeping, including during daytime naps. While taking prescription pain medicines, sleeping medicines, or medicines that make you drowsy. Return to your normal activities as told by your health care provider. Ask your health care provider what activities are safe for you. Take over-the-counter and prescription medicines only as told by your health care provider. Do not use any products that contain nicotine or tobacco. These products include cigarettes, chewing tobacco, and vaping devices, such as e-cigarettes. These can delay incision healing after surgery. If you need help quitting, ask your health care provider. Contact a health care provider if: You have nausea or vomiting that does not get better with medicine. You vomit every time you eat or drink. You have pain that does not get better with medicine. You cannot urinate or have bloody urine. You develop a skin  rash. You have a fever. Get help right away if: You have trouble breathing. You have chest pain. You vomit blood. These symptoms may be an emergency. Get help right away. Call 911. Do not wait to see if the symptoms will go away. Do not drive yourself to the hospital. Summary After the procedure, it is common to have a sore throat, hoarseness, nausea, vomiting, or to feel weak, sleepy, or fatigue. For the time period you were told by your health care provider, do not drive or use machinery. Get help right away if you have difficulty breathing, have chest pain, or vomit blood. These symptoms may be an emergency. This information is not intended to replace advice given to you by your health care provider. Make sure you discuss any questions you have with your health care provider. Document Revised: 01/30/2022 Document Reviewed: 01/30/2022 Elsevier Patient Education  [place]. How to Use Chlorhexidine Before Surgery Chlorhexidine gluconate (CHG) is a germ-killing (antiseptic) solution that is used to clean the skin. It can get rid of the bacteria that normally live on the skin and can keep them away for about 24 hours. To clean your skin with CHG, you may be given: A CHG solution to use in the shower or as part of a sponge bath. A prepackaged cloth that contains CHG. Cleaning your skin with CHG may help lower the risk for infection: While you are staying in the intensive care unit of the hospital. If you have a vascular access, such as a central line, to provide short-term or long-term access to your veins. If you have a catheter to drain urine from your bladder. If you are on a ventilator. A ventilator is a machine that helps you breathe by moving air in and out of your lungs. After surgery. What are the risks? Risks of using CHG include: A skin reaction. Hearing loss, if CHG gets in your ears and you have a perforated eardrum. Eye injury, if CHG gets in your eyes and is not  rinsed out. The CHG product catching fire. Make sure that you avoid smoking and flames after applying CHG to your skin. Do not use CHG: If you have a chlorhexidine allergy or have previously reacted to chlorhexidine. On babies younger than 31 months of age. How to use CHG solution Use CHG only as told by your health care provider, and follow the instructions on the label. Use the full amount of CHG as directed. Usually, this is one bottle. During a shower Follow these steps when using  CHG solution during a shower (unless your health care provider gives you different instructions): Start the shower. Use your normal soap and shampoo to wash your face and hair. Turn off the shower or move out of the shower stream. Pour the CHG onto a clean washcloth. Do not use any type of brush or rough-edged sponge. Starting at your neck, lather your body down to your toes. Make sure you follow these instructions: If you will be having surgery, pay special attention to the part of your body where you will be having surgery. Scrub this area for at least 1 minute. Do not use CHG on your head or face. If the solution gets into your ears or eyes, rinse them well with water. Avoid your genital area. Avoid any areas of skin that have broken skin, cuts, or scrapes. Scrub your back and under your arms. Make sure to wash skin folds. Let the lather sit on your skin for 1-2 minutes or as long as told by your health care provider. Thoroughly rinse your entire body in the shower. Make sure that all body creases and crevices are rinsed well. Dry off with a clean towel. Do not put any substances on your body afterward--such as powder, lotion, or perfume--unless you are told to do so by your health care provider. Only use lotions that are recommended by the manufacturer. Put on clean clothes or pajamas. If it is the night before your surgery, sleep in clean sheets.  During a sponge bath Follow these steps when using CHG  solution during a sponge bath (unless your health care provider gives you different instructions): Use your normal soap and shampoo to wash your face and hair. Pour the CHG onto a clean washcloth. Starting at your neck, lather your body down to your toes. Make sure you follow these instructions: If you will be having surgery, pay special attention to the part of your body where you will be having surgery. Scrub this area for at least 1 minute. Do not use CHG on your head or face. If the solution gets into your ears or eyes, rinse them well with water. Avoid your genital area. Avoid any areas of skin that have broken skin, cuts, or scrapes. Scrub your back and under your arms. Make sure to wash skin folds. Let the lather sit on your skin for 1-2 minutes or as long as told by your health care provider. Using a different clean, wet washcloth, thoroughly rinse your entire body. Make sure that all body creases and crevices are rinsed well. Dry off with a clean towel. Do not put any substances on your body afterward--such as powder, lotion, or perfume--unless you are told to do so by your health care provider. Only use lotions that are recommended by the manufacturer. Put on clean clothes or pajamas. If it is the night before your surgery, sleep in clean sheets. How to use CHG prepackaged cloths Only use CHG cloths as told by your health care provider, and follow the instructions on the label. Use the CHG cloth on clean, dry skin. Do not use the CHG cloth on your head or face unless your health care provider tells you to. When washing with the CHG cloth: Avoid your genital area. Avoid any areas of skin that have broken skin, cuts, or scrapes. Before surgery Follow these steps when using a CHG cloth to clean before surgery (unless your health care provider gives you different instructions): Using the CHG cloth, vigorously scrub the part of  your body where you will be having surgery. Scrub using a  back-and-forth motion for 3 minutes. The area on your body should be completely wet with CHG when you are done scrubbing. Do not rinse. Discard the cloth and let the area air-dry. Do not put any substances on the area afterward, such as powder, lotion, or perfume. Put on clean clothes or pajamas. If it is the night before your surgery, sleep in clean sheets.  For general bathing Follow these steps when using CHG cloths for general bathing (unless your health care provider gives you different instructions). Use a separate CHG cloth for each area of your body. Make sure you wash between any folds of skin and between your fingers and toes. Wash your body in the following order, switching to a new cloth after each step: The front of your neck, shoulders, and chest. Both of your arms, under your arms, and your hands. Your stomach and groin area, avoiding the genitals. Your right leg and foot. Your left leg and foot. The back of your neck, your back, and your buttocks. Do not rinse. Discard the cloth and let the area air-dry. Do not put any substances on your body afterward--such as powder, lotion, or perfume--unless you are told to do so by your health care provider. Only use lotions that are recommended by the manufacturer. Put on clean clothes or pajamas. Contact a health care provider if: Your skin gets irritated after scrubbing. You have questions about using your solution or cloth. You swallow any chlorhexidine. Call your local poison control center (1-(346)326-3021 in the [place] Get help right away if: Your eyes itch badly, or they become very red or swollen. Your skin itches badly and is red or swollen. Your hearing changes. You have trouble seeing. You have swelling or tingling in your mouth or throat. You have trouble breathing. These symptoms may represent a serious problem that is an emergency. Do not wait to see if the symptoms will go away. Get medical help right away. Call your  local emergency services (911 in the [place] Do not drive yourself to the hospital. Summary Chlorhexidine gluconate (CHG) is a germ-killing (antiseptic) solution that is used to clean the skin. Cleaning your skin with CHG may help to lower your risk for infection. You may be given CHG to use for bathing. It may be in a bottle or in a prepackaged cloth to use on your skin. Carefully follow your health care provider's instructions and the instructions on the product label. Do not use CHG if you have a chlorhexidine allergy. Contact your health care provider if your skin gets irritated after scrubbing. This information is not intended to replace advice given to you by your health care provider. Make sure you discuss any questions you have with your health care provider. Document Revised: 03/02/2022 Document Reviewed: 01/13/2021 Elsevier Patient Education  [place].

## 2023-02-02 ENCOUNTER — Other Ambulatory Visit: Payer: Self-pay | Admitting: Radiology

## 2023-02-02 ENCOUNTER — Encounter (HOSPITAL_COMMUNITY): Payer: Self-pay

## 2023-02-02 ENCOUNTER — Other Ambulatory Visit: Payer: Self-pay | Admitting: Internal Medicine

## 2023-02-02 ENCOUNTER — Encounter (HOSPITAL_COMMUNITY)
Admission: RE | Admit: 2023-02-02 | Discharge: 2023-02-02 | Disposition: A | Discharge status: Home / Self Care | Payer: PPO | Source: Ambulatory Visit | Attending: Urology | Admitting: Urology

## 2023-02-02 ENCOUNTER — Ambulatory Visit (INDEPENDENT_AMBULATORY_CARE_PROVIDER_SITE_OTHER): Payer: PPO | Admitting: Internal Medicine

## 2023-02-02 ENCOUNTER — Other Ambulatory Visit: Payer: Self-pay | Admitting: Family Medicine

## 2023-02-02 DIAGNOSIS — Z0000 Encounter for general adult medical examination without abnormal findings: Secondary | ICD-10-CM

## 2023-02-02 DIAGNOSIS — I10 Essential (primary) hypertension: Secondary | ICD-10-CM

## 2023-02-02 DIAGNOSIS — N200 Calculus of kidney: Secondary | ICD-10-CM

## 2023-02-02 DIAGNOSIS — I2510 Atherosclerotic heart disease of native coronary artery without angina pectoris: Secondary | ICD-10-CM

## 2023-02-02 NOTE — Patient Instructions (Signed)
  Leah [name] , Thank you for taking time to come for your Medicare Wellness Visit. I appreciate your ongoing commitment to your health goals. Please review the following plan we discussed and let me know if I can assist you in the future.   These are the goals we discussed: Patient is going to cut back on smoking.    This is a list of the screening recommended for you and due dates:  Health Maintenance  Topic Date Due   DTaP/Tdap/Td vaccine (1 - Tdap) Never done   Zoster (Shingles) Vaccine (1 of 2) Never done   COVID-19 Vaccine (5 - 2023-24 season) 07/17/2022   Mammogram  11/11/2022   Screening for Lung Cancer  04/30/2023   Medicare Annual Wellness Visit  02/02/2024   Colon Cancer Screening  04/23/2031   Pneumonia Vaccine  Completed   Flu Shot  Completed   DEXA scan (bone density measurement)  Completed   Hepatitis C Screening: USPSTF Recommendation to screen - Ages 69-79 yo.  Completed   HPV Vaccine  Aged Out

## 2023-02-02 NOTE — Progress Notes (Signed)
Subjective:  This is a telephone encounter between Leah [name] and Leah [name] on 02/02/2023 for AWV. The visit was conducted with the patient located at home and Leah [name] at [facility]. The patient's identity was confirmed using their DOB and current address. The patient has consented to being evaluated through a telephone encounter and understands the associated risks (an examination cannot be done and the patient may need to come in for an appointment) / benefits (allows the patient to remain at home, decreasing exposure to coronavirus).      Leah [name] is a 67 y.o. female who presents for Medicare Annual (Subsequent) preventive examination.  Review of Systems    Review of Systems  All other systems reviewed and are negative.   Objective:    There were no vitals filed for this visit. There is no height or weight on file to calculate BMI.     02/02/2023    9:31 AM 11/25/2021    9:39 AM 04/22/2021   10:08 AM 04/18/2021    9:12 AM 06/05/2020    2:53 PM 01/24/2020    2:44 PM 12/12/2019    1:52 PM  Advanced Directives  Does Patient Have a Medical Advance Directive? Yes No No No No No No  Does patient want to make changes to medical advance directive?     No - Patient declined    Would patient like information on creating a medical advance directive?  No - Patient declined No - Patient declined No - Patient declined No - Patient declined No - Patient declined No - Patient declined    Current Medications (verified) Outpatient Encounter Medications as of 02/02/2023  Medication Sig   amLODipine (NORVASC) 10 MG tablet TAKE ONE TABLET BY MOUTH ONCE DAILY.   aspirin EC 81 MG tablet Take 81 mg by mouth every morning.    atorvastatin (LIPITOR) 80 MG tablet TAKE 1 TABLET BY MOUTH DAILY.   Blood Pressure Monitoring (BLOOD PRESSURE MONITOR AUTOMAT) DEVI 1 Device by Does not apply route daily. Check blood pressure daily   cyanocobalamin (VITAMIN B12) 1000 MCG tablet Take 1,000 mcg by mouth  daily.   Ergocalciferol (VITAMIN D2) 50 MCG (2000 UT) TABS Take 2,000 Units by mouth daily.   ezetimibe (ZETIA) 10 MG tablet TAKE ONE TABLET BY MOUTH ONCE DAILY.   hydrochlorothiazide (HYDRODIURIL) 12.5 MG tablet Take 1 tablet (12.5 mg total) by mouth daily.   ibuprofen (ADVIL) 200 MG tablet Take 400 mg by mouth every 6 (six) hours as needed for headache or moderate pain.   metoprolol tartrate (LOPRESSOR) 50 MG tablet TAKE 1 TABLET BY MOUTH ONCE A DAY.   Multiple Vitamin (MULTIVITAMIN WITH MINERALS) TABS tablet Take 1 tablet by mouth every morning.   nicotine (NICODERM CQ - DOSED IN MG/24 HOURS) 14 mg/24hr patch Place 1 patch (14 mg total) onto the skin daily. (Patient not taking: Reported on 12/08/2022)   olmesartan (BENICAR) 20 MG tablet TAKE (1) TABLET BY MOUTH ONCE DAILY.   Omega 3 1000 MG CAPS Take 1,000 mg by mouth daily.   vitamin E 400 UNIT capsule Take 400 Units by mouth daily.   No facility-administered encounter medications on file as of 02/02/2023.    Allergies (verified) Codeine   History: Past Medical History:  Diagnosis Date   Arthritis    Borderline diabetes    Breast lump on right side at 3 o'clock position    Benign, fatty mass   History of kidney stones    History  of shingles    Hyperlipidemia    Hypertension    Myocardial infarction ([place]) 1996    HAD ANGIOPLASTY WITH 2 STENTS   Tinnitus    right ear    Vitamin D deficiency 10/17/2019   Wears glasses    Past Surgical History:  Procedure Laterality Date   BREAST LUMPECTOMY  >10 YRS AGO   RT BREAST   COLONOSCOPY WITH PROPOFOL N/A 04/22/2021   Procedure: COLONOSCOPY WITH PROPOFOL;  Surgeon: Eloise Harman, DO;  [facility];  Service: Endoscopy;  Laterality: N/A;  11:45am   CORONARY ANGIOPLASTY  1996   ANGIOPLASTY / 2 STENTS   CYSTOSCOPY WITH STENT PLACEMENT Right 12/30/2015   Procedure: CYSTOSCOPY;  Surgeon: Cleon Gustin, MD;  [facility]: [facility];  Service: Urology;  Laterality: Right;    CYSTOSCOPY WITH STENT PLACEMENT Right 01/09/2016   Procedure: CYSTOSCOPY WITH STENT PLACEMENT;  Surgeon: Cleon Gustin, MD;  [facility];  Service: Urology;  Laterality: Right;   HOLMIUM LASER APPLICATION Right [id number]   Procedure: HOLMIUM LASER APPLICATION;  Surgeon: Cleon Gustin, MD;  [facility];  Service: Urology;  Laterality: Right;   LITHOTRIPSY     NEPHROLITHOTOMY Right 12/30/2015   Procedure: RIght Nephrostomy with access;  Surgeon: Cleon Gustin, MD;  [facility];  Service: Urology;  Laterality: Right;   NEPHROLITHOTOMY Right 01/09/2016   Procedure: RIGHT PERCUTANEOUS NEPHROLITHOTOMY SECOND LOOK , POSSIBLE DILATION OF TRACT;  Surgeon: Cleon Gustin, MD;  [facility];  Service: Urology;  Laterality: Right;   POLYPECTOMY  04/22/2021   Procedure: POLYPECTOMY;  Surgeon: Eloise Harman, DO;  [facility];  Service: Endoscopy;;   Family History  Problem Relation Age of Onset   Heart disease Mother    Diabetes Mother    Cancer Father        bone   Cirrhosis Father    Stroke Maternal Grandmother    Stroke Maternal Grandfather    Heart disease Sister    Obesity Brother    Healthy Daughter    Social History   Socioeconomic History   Marital status: Widowed    Spouse name: Not on file   Number of children: 1   Years of education: 19   Highest education level: Master's degree (e.g., MA, MS, MEng, MEd, MSW, MBA)  Occupational History   Occupation: Retired  Tobacco Use   Smoking status: Every Day    Packs/day: 1.00    Years: 40.00    Additional pack years: 0.00    Total pack years: 40.00    Types: Cigarettes   Smokeless tobacco: Never  Vaping Use   Vaping Use: Never used  Substance and Sexual Activity   Alcohol use: No    Alcohol/week: 0.0 standard drinks of alcohol   Drug use: No   Sexual activity: Yes  Other Topics Concern   Not on file  Social History Narrative   Single,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   Social  Determinants of Health   Financial Resource Strain: Low Risk  (11/25/2021)   Overall Financial Resource Strain (CARDIA)    Difficulty of Paying Living Expenses: Not hard at all  Food Insecurity: No Food Insecurity (06/19/2022)   Hunger Vital Sign    Worried About Running Out of Food in the Last Year: Never true    Ran Out of Food in the Last Year: Never true  Transportation Needs: No Transportation Needs (06/19/2022)   PRAPARE - Hydrologist (  Medical): No    Lack of Transportation (Non-Medical): No  Physical Activity: Sufficiently Active (11/25/2021)   Exercise Vital Sign    Days of Exercise per Week: 5 days    Minutes of Exercise per Session: 30 min  Stress: No Stress Concern Present (11/25/2021)   [place]    Feeling of Stress : Not at all  Social Connections: Socially Isolated (11/25/2021)   Social Connection and Isolation Panel [NHANES]    Frequency of Communication with Friends and Family: More than three times a week    Frequency of Social Gatherings with Friends and Family: Once a week    Attends Religious Services: Never    Marine scientist or Organizations: No    Attends Archivist Meetings: Never    Marital Status: Widowed    Tobacco Counseling Ready to quit: Not Answered Counseling given: Not Answered   Clinical Intake:  Pre-visit preparation completed: Yes  Pain : No/denies pain     Diabetes: No  How often do you need to have someone help you when you read instructions, pamphlets, or other written materials from your doctor or pharmacy?: 1 - Never What is the last grade level you completed in school?: Bachelors degree    Activities of Daily Living    02/02/2023    9:34 AM  In your present state of health, do you have any difficulty performing the following activities:  Hearing? 0  Vision? 0  Difficulty concentrating or making decisions? 0   Walking or climbing stairs? 0  Dressing or bathing? 0  Doing errands, shopping? 0    Patient Care Team: Lindell Spar, MD as PCP - General (Internal Medicine) Harl Bowie Alphonse Guild, MD as PCP - Cardiology (Cardiology) Eloise Harman, DO as Consulting Physician (Internal Medicine)  Indicate any recent Medical Services you may have received from other than [facility] providers in the past year (date may be approximate).     Assessment:   This is a routine wellness examination for [facility]  Hearing/Vision screen No results found.  Dietary issues and exercise activities discussed:     Goals Addressed   None    Depression Screen    02/02/2023    9:34 AM 01/12/2023    8:34 AM 09/11/2022    8:50 AM 06/16/2022    1:35 PM 05/13/2022    9:25 AM 03/12/2022    8:08 AM 11/25/2021    9:36 AM  PHQ 2/9 Scores  PHQ - 2 Score 0 0 0 0 0 0 0    Fall Risk    02/02/2023    9:34 AM 01/12/2023    8:34 AM 09/11/2022    8:50 AM 06/19/2022    1:36 PM 06/16/2022    1:35 PM  Fall Risk   Falls in the past year? 0 0 0 0 0  Number falls in past yr:  0 0 0 0  Injury with Fall?  0 0 1 0  Risk for fall due to :   No Fall Risks History of fall(s) No Fall Risks  Follow up   Falls evaluation completed  Falls evaluation completed    [place]:  Any stairs in or around the home? No  If so, are there any without handrails? No  Home free of loose throw rugs in walkways, pet beds, electrical cords, etc? Yes  Adequate lighting in your home to reduce risk of falls? Yes  ASSISTIVE DEVICES UTILIZED TO PREVENT FALLS:  Life alert? No  Use of a cane, walker or w/c? No  Grab bars in the bathroom? Yes  Shower chair or bench in shower? No  Elevated toilet seat or a handicapped toilet? No    Cognitive Function:        02/02/2023    9:34 AM 11/25/2021    9:42 AM  6CIT Screen  What Year? 0 points 0 points  What month? 0 points 0 points  What time? 0 points 0 points  Count  back from 20 0 points 0 points  Months in reverse 0 points 0 points  Repeat phrase 0 points 0 points  Total Score 0 points 0 points    Immunizations Immunization History  Administered Date(s) Administered   Fluad Quad(high Dose 65+) 10/17/2019, 09/04/2020, 09/10/2021, 09/11/2022   H1N1 10/10/2008   Influenza Whole 12/07/2006, 08/29/2008   Influenza,inj,Quad PF,6+ Mos 01/10/2016   PFIZER(Purple Top)SARS-COV-2 Vaccination 02/29/2020, 03/22/2020, 09/27/2020, 03/07/2021   PNEUMOCOCCAL CONJUGATE-20 09/10/2021   Pneumococcal Polysaccharide-23 07/13/2008, 10/17/2019    TDAP status: Due, Education has been provided regarding the importance of this vaccine. Advised may receive this vaccine at local pharmacy or Health Dept. Aware to provide a copy of the vaccination record if obtained from local pharmacy or Health Dept. Verbalized acceptance and understanding.  Flu Vaccine status: Up to date  Pneumococcal vaccine status: Up to date  Covid-19 vaccine status: Information provided on how to obtain vaccines.   Qualifies for Shingles Vaccine? Yes   Zostavax completed No   Shingrix Completed?: No.    Education has been provided regarding the importance of this vaccine. Patient has been advised to call insurance company to determine out of pocket expense if they have not yet received this vaccine. Advised may also receive vaccine at local pharmacy or Health Dept. Verbalized acceptance and understanding.  Screening Tests Health Maintenance  Topic Date Due   DTaP/Tdap/Td (1 - Tdap) Never done   Zoster Vaccines- Shingrix (1 of 2) Never done   COVID-19 Vaccine (5 - 2023-24 season) 07/17/2022   MAMMOGRAM  11/11/2022   Medicare Annual Wellness (AWV)  11/25/2022   Lung Cancer Screening  04/30/2023   COLONOSCOPY (Pts 45-57yrs Insurance coverage will need to be confirmed)  04/23/2031   Pneumonia Vaccine 6+ Years old  Completed   INFLUENZA VACCINE  Completed   DEXA SCAN  Completed   Hepatitis C  Screening  Completed   HPV VACCINES  Aged Out    Health Maintenance  Health Maintenance Due  Topic Date Due   DTaP/Tdap/Td (1 - Tdap) Never done   Zoster Vaccines- Shingrix (1 of 2) Never done   COVID-19 Vaccine (5 - 2023-24 season) 07/17/2022   MAMMOGRAM  11/11/2022   Medicare Annual Wellness (AWV)  11/25/2022    Colorectal cancer screening: Type of screening: Colonoscopy. Completed 04/22/2021. Repeat every 10 years  Mammogram status: Ordered 02/02/2023. Pt provided with contact info and advised to call to schedule appt.   Bone Density status: Completed 11/26/2016. Results reflect: Bone density results: OSTEOPENIA. Repeat every 2 years.  Lung Cancer Screening: (Low Dose CT Chest recommended if Age 46-80 years, 30 pack-year currently smoking OR have quit w/in 15years.) does qualify. Last complete 04/2022   Additional Screening:  Hepatitis C Screening: does not qualify; Completed 10/17/2019  Vision Screening: Recommended annual ophthalmology exams for early detection of glaucoma and other disorders of the eye. Is the patient up to date with their annual eye exam?  Yes  Who is the provider or what is the name of the office in which the patient attends annual eye exams? [facility] If pt is not established with a provider, would they like to be referred to a provider to establish care? No .   Dental Screening: Recommended annual dental exams for proper oral hygiene  Community Resource Referral / Chronic Care Management: CRR required this visit?  No   CCM required this visit?  No      Plan:     I have personally reviewed and noted the following in the patient's chart:   Medical and social history Use of alcohol, tobacco or illicit drugs  Current medications and supplements including opioid prescriptions. Patient is not currently taking opioid prescriptions. Functional ability and status Nutritional status Physical activity Advanced directives List of other  physicians Hospitalizations, surgeries, and ER visits in previous 12 months Vitals Screenings to include cognitive, depression, and falls Referrals and appointments  In addition, I have reviewed and discussed with patient certain preventive protocols, quality metrics, and best practice recommendations. A written personalized care plan for preventive services as well as general preventive health recommendations were provided to patient.     Leah Dy, MD   02/02/2023

## 2023-02-03 ENCOUNTER — Other Ambulatory Visit (HOSPITAL_COMMUNITY): Payer: PPO

## 2023-02-03 ENCOUNTER — Ambulatory Visit (HOSPITAL_COMMUNITY): Payer: PPO

## 2023-02-04 ENCOUNTER — Encounter (HOSPITAL_COMMUNITY): Admission: RE | Payer: Self-pay | Source: Home / Self Care

## 2023-02-04 ENCOUNTER — Ambulatory Visit (HOSPITAL_COMMUNITY): Admission: RE | Admit: 2023-02-04 | Payer: PPO | Source: Home / Self Care | Admitting: Urology

## 2023-02-04 SURGERY — NEPHROLITHOTOMY PERCUTANEOUS
Anesthesia: General | Laterality: Right

## 2023-02-08 ENCOUNTER — Ambulatory Visit: Payer: PPO | Admitting: Urology

## 2023-02-10 ENCOUNTER — Encounter: Payer: PPO | Admitting: Urology

## 2023-02-11 ENCOUNTER — Ambulatory Visit: Payer: PPO | Admitting: Audiology

## 2023-02-16 ENCOUNTER — Other Ambulatory Visit: Payer: Self-pay | Admitting: Internal Medicine

## 2023-02-16 DIAGNOSIS — I10 Essential (primary) hypertension: Secondary | ICD-10-CM

## 2023-02-24 ENCOUNTER — Ambulatory Visit (INDEPENDENT_AMBULATORY_CARE_PROVIDER_SITE_OTHER): Payer: PPO | Admitting: Internal Medicine

## 2023-02-24 ENCOUNTER — Encounter: Payer: Self-pay | Admitting: Internal Medicine

## 2023-02-24 VITALS — BP 136/60 | HR 69 | Ht 63.0 in | Wt 174.8 lb

## 2023-02-24 DIAGNOSIS — L600 Ingrowing nail: Secondary | ICD-10-CM

## 2023-02-24 DIAGNOSIS — I10 Essential (primary) hypertension: Secondary | ICD-10-CM

## 2023-02-24 DIAGNOSIS — E782 Mixed hyperlipidemia: Secondary | ICD-10-CM | POA: Diagnosis not present

## 2023-02-24 DIAGNOSIS — R7303 Prediabetes: Secondary | ICD-10-CM | POA: Diagnosis not present

## 2023-02-25 NOTE — Assessment & Plan Note (Signed)
On statin, Zetia and omega-3 Checked lipid profile

## 2023-02-25 NOTE — Assessment & Plan Note (Signed)
Advised to avoid clipping toenails deeper Referred to Podiatry

## 2023-02-25 NOTE — Assessment & Plan Note (Signed)
BP Readings from Last 1 Encounters:  02/24/23 136/60   Well-controlled with amlodipine, HCTZ and Olmesartan Counseled for compliance with the medications Advised DASH diet and moderate exercise/walking, at least 150 mins/week

## 2023-02-25 NOTE — Assessment & Plan Note (Signed)
HbA1c: 6.4 Continue to follow low-carb diet and ambulate as tolerated

## 2023-02-25 NOTE — Progress Notes (Signed)
Acute Office Visit  Subjective:    Patient ID: Leah [name], female    DOB: [birth year], 67 y.o.   MRN: [id number]  Chief Complaint  Patient presents with   Referral Wanted    Patient is wanting a referral to a podiatrist due to ingrown toenails.     HPI Patient is in today for complaint of bilateral great toe nail EDMD.  She has ingrown toenail chronically, and has been clipping her nails deep by herself.  She denies any recent bleeding.  Left great toenail is brownish chronically.  Denies any recent injury.  BP is well-controlled. Takes medications regularly. Patient denies headache, dizziness, chest pain, dyspnea or palpitations.   Past Medical History:  Diagnosis Date   Arthritis    Borderline diabetes    Breast lump on right side at 3 o'clock position    Benign, fatty mass   History of kidney stones    History of shingles    Hyperlipidemia    Hypertension    Myocardial infarction 1996    HAD ANGIOPLASTY WITH 2 STENTS   Tinnitus    right ear    Vitamin D deficiency 10/17/2019   Wears glasses     Past Surgical History:  Procedure Laterality Date   BREAST LUMPECTOMY  >10 YRS AGO   RT BREAST   COLONOSCOPY WITH PROPOFOL N/A 04/22/2021   Procedure: COLONOSCOPY WITH PROPOFOL;  Surgeon: Lanelle Bal, DO;  [facility];  Service: Endoscopy;  Laterality: N/A;  11:45am   CORONARY ANGIOPLASTY  1996   ANGIOPLASTY / 2 STENTS   CYSTOSCOPY WITH STENT PLACEMENT Right 12/30/2015   Procedure: CYSTOSCOPY;  Surgeon: Malen Gauze, MD;  [facility];  Service: Urology;  Laterality: Right;   CYSTOSCOPY WITH STENT PLACEMENT Right 01/09/2016   Procedure: CYSTOSCOPY WITH STENT PLACEMENT;  Surgeon: Malen Gauze, MD;  [facility];  Service: Urology;  Laterality: Right;   HOLMIUM LASER APPLICATION Right 01/09/2016   Procedure: HOLMIUM LASER APPLICATION;  Surgeon: Malen Gauze, MD;  [facility];  Service: Urology;  Laterality: Right;   LITHOTRIPSY      NEPHROLITHOTOMY Right 12/30/2015   Procedure: RIght Nephrostomy with access;  Surgeon: Malen Gauze, MD;  [facility];  Service: Urology;  Laterality: Right;   NEPHROLITHOTOMY Right 01/09/2016   Procedure: RIGHT PERCUTANEOUS NEPHROLITHOTOMY SECOND LOOK , POSSIBLE DILATION OF TRACT;  Surgeon: Malen Gauze, MD;  [facility];  Service: Urology;  Laterality: Right;   POLYPECTOMY  04/22/2021   Procedure: POLYPECTOMY;  Surgeon: Lanelle Bal, DO;  [facility];  Service: Endoscopy;;    Family History  Problem Relation Age of Onset   Heart disease Mother    Diabetes Mother    Cancer Father        bone   Cirrhosis Father    Stroke Maternal Grandmother    Stroke Maternal Grandfather    Heart disease Sister    Obesity Brother    Healthy Daughter     Social History   Socioeconomic History   Marital status: Widowed    Spouse name: Not on file   Number of children: 1   Years of education: 86   Highest education level: Master's degree (e.g., MA, MS, MEng, MEd, MSW, MBA)  Occupational History   Occupation: Retired  Tobacco Use   Smoking status: Every Day    Packs/day: 1.00    Years: 40.00    Additional pack years: 0.00  Total pack years: 40.00    Types: Cigarettes   Smokeless tobacco: Never  Vaping Use   Vaping Use: Never used  Substance and Sexual Activity   Alcohol use: No    Alcohol/week: 0.0 standard drinks of alcohol   Drug use: No   Sexual activity: Yes  Other Topics Concern   Not on file  Social History Narrative   Single,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   Social Determinants of Health   Financial Resource Strain: Low Risk  (11/25/2021)   Overall Financial Resource Strain (CARDIA)    Difficulty of Paying Living Expenses: Not hard at all  Food Insecurity: No Food Insecurity (06/19/2022)   Hunger Vital Sign    Worried About Running Out of Food in the Last Year: Never true    Ran Out of Food in the Last Year: Never true   Transportation Needs: No Transportation Needs (06/19/2022)   PRAPARE - Administrator, Civil ServiceTransportation    Lack of Transportation (Medical): No    Lack of Transportation (Non-Medical): No  Physical Activity: Sufficiently Active (11/25/2021)   Exercise Vital Sign    Days of Exercise per Week: 5 days    Minutes of Exercise per Session: 30 min  Stress: No Stress Concern Present (11/25/2021)   [employer] - Occupational Stress Questionnaire    Feeling of Stress : Not at all  Social Connections: Socially Isolated (11/25/2021)   Social Connection and Isolation Panel [NHANES]    Frequency of Communication with Friends and Family: More than three times a week    Frequency of Social Gatherings with Friends and Family: Once a week    Attends Religious Services: Never    Database administratorActive Member of Clubs or Organizations: No    Attends BankerClub or Organization Meetings: Never    Marital Status: Widowed  Intimate Partner Violence: Not At Risk (11/25/2021)   Humiliation, Afraid, Rape, and Kick questionnaire    Fear of Current or Ex-Partner: No    Emotionally Abused: No    Physically Abused: No    Sexually Abused: No    Outpatient Medications Prior to Visit  Medication Sig Dispense Refill   amLODipine (NORVASC) 10 MG tablet TAKE ONE TABLET BY MOUTH ONCE DAILY. 90 tablet 0   aspirin EC 81 MG tablet Take 81 mg by mouth every morning.      atorvastatin (LIPITOR) 80 MG tablet TAKE 1 TABLET BY MOUTH DAILY. 90 tablet 0   Blood Pressure Monitoring (BLOOD PRESSURE MONITOR AUTOMAT) DEVI 1 Device by Does not apply route daily. Check blood pressure daily 1 each 0   cyanocobalamin (VITAMIN B12) 1000 MCG tablet Take 1,000 mcg by mouth daily.     Ergocalciferol (VITAMIN D2) 50 MCG (2000 UT) TABS Take 2,000 Units by mouth daily.     ezetimibe (ZETIA) 10 MG tablet TAKE ONE TABLET BY MOUTH ONCE DAILY. 90 tablet 0   hydrochlorothiazide (HYDRODIURIL) 12.5 MG tablet TAKE 1 TABLET BY MOUTH ONCE DAILY. 90 tablet 0    ibuprofen (ADVIL) 200 MG tablet Take 400 mg by mouth every 6 (six) hours as needed for headache or moderate pain.     metoprolol tartrate (LOPRESSOR) 50 MG tablet TAKE 1 TABLET BY MOUTH ONCE A DAY. 90 tablet 0   Multiple Vitamin (MULTIVITAMIN WITH MINERALS) TABS tablet Take 1 tablet by mouth every morning.     nicotine (NICODERM CQ - DOSED IN MG/24 HOURS) 14 mg/24hr patch Place 1 patch (14 mg total) onto the skin daily. (Patient not taking: Reported on 12/08/2022) 28 patch  1   olmesartan (BENICAR) 20 MG tablet TAKE (1) TABLET BY MOUTH ONCE DAILY. 90 tablet 0   Omega 3 1000 MG CAPS Take 1,000 mg by mouth daily.     vitamin E 400 UNIT capsule Take 400 Units by mouth daily.     No facility-administered medications prior to visit.    Allergies  Allergen Reactions   Codeine     REACTION: " Makes me high and blacked out in the 1980s"    Review of Systems  Constitutional:  Negative for chills and fever.  HENT:  Negative for congestion, sinus pressure, sinus pain and sore throat.        Hearing problem  Eyes:  Negative for pain and discharge.  Respiratory:  Negative for cough and shortness of breath.   Cardiovascular:  Negative for chest pain and palpitations.  Gastrointestinal:  Negative for abdominal pain, constipation, diarrhea, nausea and vomiting.  Endocrine: Negative for polydipsia and polyuria.  Genitourinary:  Negative for dysuria and hematuria.  Musculoskeletal:  Negative for neck pain and neck stiffness.  Skin:  Negative for rash.  Neurological:  Negative for dizziness and weakness.  Psychiatric/Behavioral:  Negative for agitation and behavioral problems.        Objective:    Physical Exam Vitals reviewed.  Constitutional:      General: She is not in acute distress.    Appearance: She is not diaphoretic.  HENT:     Head: Normocephalic and atraumatic.     Nose: Nose normal.     Mouth/Throat:     Mouth: Mucous membranes are moist.  Eyes:     General: No scleral  icterus.    Extraocular Movements: Extraocular movements intact.  Neck:     Vascular: No carotid bruit.  Cardiovascular:     Rate and Rhythm: Normal rate and regular rhythm.     Pulses: Normal pulses.     Heart sounds: Normal heart sounds. No murmur heard. Pulmonary:     Breath sounds: Normal breath sounds. No wheezing or rales.  Musculoskeletal:     Cervical back: Neck supple. No tenderness.     Right lower leg: No edema.     Left lower leg: No edema.  Feet:     Right foot:     Toenail Condition: Right toenails are ingrown.     Left foot:     Toenail Condition: Left toenails are ingrown.  Skin:    General: Skin is warm.     Findings: No rash.  Neurological:     General: No focal deficit present.     Mental Status: She is alert and oriented to person, place, and time.  Psychiatric:        Mood and Affect: Mood normal.        Behavior: Behavior normal.     BP 136/60 (BP Location: Left Arm)   Pulse 69   Ht 5\' 3"  (1.6 m)   Wt 174 lb 12.8 oz (79.3 kg)   LMP 05/16/2002   SpO2 96%   BMI 30.96 kg/m  Wt Readings from Last 3 Encounters:  02/24/23 174 lb 12.8 oz (79.3 kg)  12/08/22 175 lb (79.4 kg)  11/26/22 174 lb (78.9 kg)        Assessment & Plan:   Problem List Items Addressed This Visit       Cardiovascular and Mediastinum   Essential hypertension    BP Readings from Last 1 Encounters:  02/24/23 136/60  Well-controlled with amlodipine, HCTZ and Olmesartan  Counseled for compliance with the medications Advised DASH diet and moderate exercise/walking, at least 150 mins/week        Musculoskeletal and Integument   Ingrown toenail of both feet - Primary    Advised to avoid clipping toenails deeper Referred to Podiatry      Relevant Orders   Ambulatory referral to Podiatry     Other   Hyperlipidemia    On statin, Zetia and omega-3 Checked lipid profile      Prediabetes    HbA1c: 6.4 Continue to follow low-carb diet and ambulate as tolerated         No orders of the defined types were placed in this encounter.    Anabel Halon, MD

## 2023-03-02 ENCOUNTER — Ambulatory Visit: Payer: PPO | Admitting: Podiatry

## 2023-03-08 ENCOUNTER — Other Ambulatory Visit: Payer: Self-pay | Admitting: Internal Medicine

## 2023-03-10 ENCOUNTER — Ambulatory Visit: Payer: PPO | Admitting: Podiatry

## 2023-03-19 ENCOUNTER — Ambulatory Visit: Payer: PPO | Admitting: Podiatry

## 2023-03-19 ENCOUNTER — Encounter: Payer: Self-pay | Admitting: Podiatry

## 2023-03-19 DIAGNOSIS — L600 Ingrowing nail: Secondary | ICD-10-CM

## 2023-03-19 NOTE — Patient Instructions (Signed)

## 2023-03-21 NOTE — Progress Notes (Signed)
Subjective:   Patient ID: Leah [name], female   DOB: 67 y.o.   MRN: [id number]   HPI Patient presents stating she has had ingrown toenails on both feet but the left is the 1 that bothers her the most gives her the most problems.  States it gets sore makes it hard for her to wear shoe gear comfortably and she has tried trimming it soaking it without relief and patient does not smoke likes to be active   Review of Systems  All other systems reviewed and are negative.       Objective:  Physical Exam Vitals and nursing note reviewed.  Constitutional:      Appearance: She is well-developed.  Pulmonary:     Effort: Pulmonary effort is normal.  Musculoskeletal:        General: Normal range of motion.  Skin:    General: Skin is warm.  Neurological:     Mental Status: She is alert.     Neurovascular status intact muscle strength adequate range of motion adequate with inflammation around the left over right big toenail localized no drainage noted no erythema noted with structural pain of the nailbed.  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  Today I infiltrated the left big toe 60 mg Xylocaine Marcaine mixture sterile prep done using sterile instrumentation remove the border exposed matrix applied phenol 3 applications 30 seconds followed by alcohol lavage sterile dressing gave instructions on soaks and to wear dressing 24 hours taken off earlier if throbbing were to occur.  Encouraged to call with questions concerns which may arise

## 2023-04-30 ENCOUNTER — Other Ambulatory Visit: Payer: Self-pay | Admitting: Internal Medicine

## 2023-04-30 DIAGNOSIS — I10 Essential (primary) hypertension: Secondary | ICD-10-CM

## 2023-05-04 ENCOUNTER — Other Ambulatory Visit: Payer: Self-pay | Admitting: Family Medicine

## 2023-05-04 DIAGNOSIS — I10 Essential (primary) hypertension: Secondary | ICD-10-CM

## 2023-05-04 DIAGNOSIS — I2510 Atherosclerotic heart disease of native coronary artery without angina pectoris: Secondary | ICD-10-CM

## 2023-05-19 ENCOUNTER — Telehealth: Payer: Self-pay | Admitting: Internal Medicine

## 2023-05-19 ENCOUNTER — Other Ambulatory Visit: Payer: Self-pay

## 2023-05-19 DIAGNOSIS — I2510 Atherosclerotic heart disease of native coronary artery without angina pectoris: Secondary | ICD-10-CM

## 2023-05-19 DIAGNOSIS — I10 Essential (primary) hypertension: Secondary | ICD-10-CM

## 2023-05-19 MED ORDER — ATORVASTATIN CALCIUM 80 MG PO TABS: 80.0000 mg | ORAL_TABLET | Freq: Every day | ORAL | 0 refills | Status: DC

## 2023-05-19 MED ORDER — HYDROCHLOROTHIAZIDE 12.5 MG PO TABS: 12.5000 mg | ORAL_TABLET | Freq: Every day | ORAL | 0 refills | Status: DC

## 2023-05-19 NOTE — Telephone Encounter (Signed)
Refills sent to pharmacy. 

## 2023-05-19 NOTE — Telephone Encounter (Signed)
Patient came by the office need med refill  hydrochlorothiazide (HYDRODIURIL) 12.5 MG tablet [[id number]]   atorvastatin (LIPITOR) 80 MG tablet [[id number]]   [facility]

## 2023-05-26 ENCOUNTER — Other Ambulatory Visit: Payer: Self-pay | Admitting: Internal Medicine

## 2023-05-26 DIAGNOSIS — I10 Essential (primary) hypertension: Secondary | ICD-10-CM

## 2023-06-10 ENCOUNTER — Telehealth: Payer: Self-pay | Admitting: *Deleted

## 2023-06-10 NOTE — Telephone Encounter (Signed)
I connected with Leah [name] on 7/25 at 1017 by telephone and verified that I am speaking with the correct person using two identifiers. According to the patient's chart they are due for follow up with [facility]. Pt scheduled. There are no transportation issues at this time. Nothing further was needed at the end of our conversation.

## 2023-06-15 ENCOUNTER — Other Ambulatory Visit: Payer: Self-pay | Admitting: Internal Medicine

## 2023-08-05 ENCOUNTER — Other Ambulatory Visit: Payer: Self-pay | Admitting: Internal Medicine

## 2023-08-05 ENCOUNTER — Other Ambulatory Visit: Payer: Self-pay | Admitting: Family Medicine

## 2023-08-05 DIAGNOSIS — I10 Essential (primary) hypertension: Secondary | ICD-10-CM

## 2023-08-05 DIAGNOSIS — I2510 Atherosclerotic heart disease of native coronary artery without angina pectoris: Secondary | ICD-10-CM

## 2023-08-11 ENCOUNTER — Encounter: Payer: Self-pay | Admitting: Internal Medicine

## 2023-08-11 ENCOUNTER — Ambulatory Visit: Payer: PPO | Admitting: Internal Medicine

## 2023-08-11 VITALS — BP 130/80 | HR 93 | Ht 63.0 in | Wt 170.4 lb

## 2023-08-11 DIAGNOSIS — N1831 Chronic kidney disease, stage 3a: Secondary | ICD-10-CM | POA: Diagnosis not present

## 2023-08-11 DIAGNOSIS — Z0001 Encounter for general adult medical examination with abnormal findings: Secondary | ICD-10-CM | POA: Diagnosis not present

## 2023-08-11 DIAGNOSIS — R7303 Prediabetes: Secondary | ICD-10-CM | POA: Diagnosis not present

## 2023-08-11 DIAGNOSIS — Z23 Encounter for immunization: Secondary | ICD-10-CM | POA: Diagnosis not present

## 2023-08-11 DIAGNOSIS — I2510 Atherosclerotic heart disease of native coronary artery without angina pectoris: Secondary | ICD-10-CM

## 2023-08-11 DIAGNOSIS — J439 Emphysema, unspecified: Secondary | ICD-10-CM

## 2023-08-11 DIAGNOSIS — I10 Essential (primary) hypertension: Secondary | ICD-10-CM | POA: Diagnosis not present

## 2023-08-11 DIAGNOSIS — Z720 Tobacco use: Secondary | ICD-10-CM

## 2023-08-11 DIAGNOSIS — E782 Mixed hyperlipidemia: Secondary | ICD-10-CM | POA: Diagnosis not present

## 2023-08-11 DIAGNOSIS — Z122 Encounter for screening for malignant neoplasm of respiratory organs: Secondary | ICD-10-CM | POA: Insufficient documentation

## 2023-08-11 MED ORDER — METOPROLOL TARTRATE 25 MG PO TABS: 25.0000 mg | ORAL_TABLET | Freq: Every day | ORAL | 1 refills | Status: DC

## 2023-08-11 MED ORDER — HYDROCHLOROTHIAZIDE 12.5 MG PO TABS: 12.5000 mg | ORAL_TABLET | Freq: Every day | ORAL | 3 refills | Status: DC

## 2023-08-11 MED ORDER — EZETIMIBE 10 MG PO TABS: 10.0000 mg | ORAL_TABLET | Freq: Every day | ORAL | 3 refills | Status: DC

## 2023-08-11 MED ORDER — OLMESARTAN MEDOXOMIL 20 MG PO TABS: 20.0000 mg | ORAL_TABLET | Freq: Every day | ORAL | 3 refills | Status: DC

## 2023-08-11 MED ORDER — ATORVASTATIN CALCIUM 80 MG PO TABS: 80.0000 mg | ORAL_TABLET | Freq: Every day | ORAL | 3 refills | Status: DC

## 2023-08-11 NOTE — Assessment & Plan Note (Signed)
Lab Results  Component Value Date   HGBA1C 6.4 (H) 01/14/2023   Continue to follow low-carb diet and ambulate as tolerated

## 2023-08-11 NOTE — Assessment & Plan Note (Signed)
On statin, Zetia and omega-3 Check lipid profile

## 2023-08-11 NOTE — Assessment & Plan Note (Signed)
Has > 20-pack-year smoking history Ordered low-dose CT chest after discussing with the patient.

## 2023-08-11 NOTE — Progress Notes (Signed)
Established Patient Office Visit  Subjective:  Patient ID: Leah [name], female    DOB: 06-20-1956  Age: 67 y.o. MRN: [id number]  CC:  Chief Complaint  Patient presents with   Hypertension    Follow up    balance issues    Patient feels off balanced    HPI Leah [name] is a 67 y.o. female with past medical history of CAD s/p stent placement, HTN, CKD, emphysema, HLD, OA and tobacco abuse who presents for annual physical.  CAD: She has had stent placed to RCA in 1998.  She denies any anginal pain, dyspnea or palpitations currently.  She is on aspirin, statin, Zetia and metoprolol currently.  She follows up with cardiology. BP is well-controlled. Takes medications regularly. Patient denies headache, dizziness, chest pain, dyspnea or palpitations.  CKD: Her last CMP showed improving GFR to 61, but states that she has been taking only 16 ounces of fluid in a day now. Her ALP has been borderline elevated.  She denies any dysuria, hematuria or urinary retention or resistance.  She takes vitamin D, vitamin B12 and vitamin D supplements.   She also reports history of prediabetes.  Denies any fatigue, polyuria or polydipsia.  She reports an episode of dizziness while dragging the cooler while walking backwards and uphill.  She also has had episodes of dizziness when she suddenly tries to get up from bending position.  Her BP is WNL today.  Orthostatics are negative, but she has history of poor p.o. intake.  Denies visual disturbance currently.  Her gait is overall normal.   Past Medical History:  Diagnosis Date   Arthritis    Borderline diabetes    Breast lump on right side at 3 o'clock position    Benign, fatty mass   History of kidney stones    History of shingles    Hyperlipidemia    Hypertension    Myocardial infarction (HCC) 1996    HAD ANGIOPLASTY WITH 2 STENTS   Tinnitus    right ear    Vitamin D deficiency 10/17/2019   Wears glasses     Past Surgical History:   Procedure Laterality Date   BREAST LUMPECTOMY  >10 YRS AGO   RT BREAST   COLONOSCOPY WITH PROPOFOL N/A 04/22/2021   Procedure: COLONOSCOPY WITH PROPOFOL;  Surgeon: Lanelle Bal, DO;  Location: [facility];  Service: Endoscopy;  Laterality: N/A;  11:45am   CORONARY ANGIOPLASTY  1996   ANGIOPLASTY / 2 STENTS   CYSTOSCOPY WITH STENT PLACEMENT Right 12/30/2015   Procedure: CYSTOSCOPY;  Surgeon: Malen Gauze, MD;  Location: [facility];  Service: Urology;  Laterality: Right;   CYSTOSCOPY WITH STENT PLACEMENT Right 01/09/2016   Procedure: CYSTOSCOPY WITH STENT PLACEMENT;  Surgeon: Malen Gauze, MD;  Location: [facility];  Service: Urology;  Laterality: Right;   HOLMIUM LASER APPLICATION Right 01/09/2016   Procedure: HOLMIUM LASER APPLICATION;  Surgeon: Malen Gauze, MD;  Location: [facility];  Service: Urology;  Laterality: Right;   LITHOTRIPSY     NEPHROLITHOTOMY Right 12/30/2015   Procedure: RIght Nephrostomy with access;  Surgeon: Malen Gauze, MD;  Location: [facility];  Service: Urology;  Laterality: Right;   NEPHROLITHOTOMY Right 01/09/2016   Procedure: RIGHT PERCUTANEOUS NEPHROLITHOTOMY SECOND LOOK , POSSIBLE DILATION OF TRACT;  Surgeon: Malen Gauze, MD;  Location: [facility];  Service: Urology;  Laterality: Right;   POLYPECTOMY  04/22/2021   Procedure: POLYPECTOMY;  Surgeon: Lanelle Bal, DO;  Location:  [facility];  Service: Endoscopy;;    Family History  Problem Relation Age of Onset   Heart disease Mother    Diabetes Mother    Cancer Father        bone   Cirrhosis Father    Stroke Maternal Grandmother    Stroke Maternal Grandfather    Heart disease Sister    Obesity Brother    Healthy Daughter     Social History   Socioeconomic History   Marital status: Widowed    Spouse name: Not on file   Number of children: 1   Years of education: 20   Highest education level: Master's degree (e.g., MA, MS, MEng, MEd, MSW, MBA)  Occupational History   Occupation:  Retired  Tobacco Use   Smoking status: Every Day    Current packs/day: 1.00    Average packs/day: 1 pack/day for 40.0 years (40.0 ttl pk-yrs)    Types: Cigarettes   Smokeless tobacco: Never  Vaping Use   Vaping status: Never Used  Substance and Sexual Activity   Alcohol use: No    Alcohol/week: 0.0 standard drinks of alcohol   Drug use: No   Sexual activity: Yes  Other Topics Concern   Not on file  Social History Narrative   Single,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   Social Determinants of Health   Financial Resource Strain: Low Risk  (11/25/2021)   Overall Financial Resource Strain (CARDIA)    Difficulty of Paying Living Expenses: Not hard at all  Food Insecurity: No Food Insecurity (06/19/2022)   Hunger Vital Sign    Worried About Running Out of Food in the Last Year: Never true    Ran Out of Food in the Last Year: Never true  Transportation Needs: No Transportation Needs (06/10/2023)   PRAPARE - Administrator, Civil Service (Medical): No    Lack of Transportation (Non-Medical): No  Physical Activity: Sufficiently Active (11/25/2021)   Exercise Vital Sign    Days of Exercise per Week: 5 days    Minutes of Exercise per Session: 30 min  Stress: No Stress Concern Present (11/25/2021)   [employer] of Occupational Health - Occupational Stress Questionnaire    Feeling of Stress : Not at all  Social Connections: Socially Isolated (11/25/2021)   Social Connection and Isolation Panel [NHANES]    Frequency of Communication with Friends and Family: More than three times a week    Frequency of Social Gatherings with Friends and Family: Once a week    Attends Religious Services: Never    Database administrator or Organizations: No    Attends Banker Meetings: Never    Marital Status: Widowed  Intimate Partner Violence: Not At Risk (11/25/2021)   Humiliation, Afraid, Rape, and Kick questionnaire    Fear of Current or Ex-Partner: No    Emotionally Abused:  No    Physically Abused: No    Sexually Abused: No    Outpatient Medications Prior to Visit  Medication Sig Dispense Refill   amLODipine (NORVASC) 10 MG tablet TAKE ONE TABLET BY MOUTH ONCE DAILY. 90 tablet 0   aspirin EC 81 MG tablet Take 81 mg by mouth every morning.      Blood Pressure Monitoring (BLOOD PRESSURE MONITOR AUTOMAT) DEVI 1 Device by Does not apply route daily. Check blood pressure daily 1 each 0   cyanocobalamin (VITAMIN B12) 1000 MCG tablet Take 1,000 mcg by mouth daily.     Ergocalciferol (VITAMIN D2) 50 MCG (2000 UT)  TABS Take 2,000 Units by mouth daily.     ibuprofen (ADVIL) 200 MG tablet Take 400 mg by mouth every 6 (six) hours as needed for headache or moderate pain.     Multiple Vitamin (MULTIVITAMIN WITH MINERALS) TABS tablet Take 1 tablet by mouth every morning.     nicotine (NICODERM CQ - DOSED IN MG/24 HOURS) 14 mg/24hr patch Place 1 patch (14 mg total) onto the skin daily. (Patient not taking: Reported on 12/08/2022) 28 patch 1   Omega 3 1000 MG CAPS Take 1,000 mg by mouth daily.     vitamin E 400 UNIT capsule Take 400 Units by mouth daily.     atorvastatin (LIPITOR) 80 MG tablet Take 1 tablet (80 mg total) by mouth daily. 90 tablet 0   ezetimibe (ZETIA) 10 MG tablet TAKE ONE TABLET BY MOUTH ONCE DAILY. 90 tablet 0   hydrochlorothiazide (HYDRODIURIL) 12.5 MG tablet Take 1 tablet (12.5 mg total) by mouth daily. 90 tablet 0   metoprolol tartrate (LOPRESSOR) 50 MG tablet TAKE 1 TABLET BY MOUTH ONCE A DAY. 90 tablet 0   olmesartan (BENICAR) 20 MG tablet TAKE (1) TABLET BY MOUTH ONCE DAILY. 90 tablet 0   No facility-administered medications prior to visit.    Allergies  Allergen Reactions   Codeine     REACTION: " Makes me high and blacked out in the 1980s"    ROS Review of Systems  Constitutional:  Negative for chills and fever.  HENT:  Positive for tinnitus. Negative for congestion, sinus pressure, sinus pain and sore throat.        Hearing problem  Eyes:   Negative for pain and discharge.  Respiratory:  Negative for cough and shortness of breath.   Cardiovascular:  Negative for chest pain and palpitations.  Gastrointestinal:  Negative for abdominal pain, constipation, diarrhea, nausea and vomiting.  Endocrine: Negative for polydipsia and polyuria.  Genitourinary:  Negative for dysuria and hematuria.  Musculoskeletal:  Negative for neck pain and neck stiffness.  Skin:  Negative for rash.  Neurological:  Negative for dizziness and weakness.  Psychiatric/Behavioral:  Negative for agitation and behavioral problems.       Objective:    Physical Exam Vitals reviewed.  Constitutional:      General: She is not in acute distress.    Appearance: She is not diaphoretic.  HENT:     Head: Normocephalic and atraumatic.     Nose: Nose normal.     Mouth/Throat:     Mouth: Mucous membranes are moist.  Eyes:     General: No scleral icterus.    Extraocular Movements: Extraocular movements intact.  Neck:     Vascular: No carotid bruit.  Cardiovascular:     Rate and Rhythm: Normal rate and regular rhythm.     Heart sounds: Normal heart sounds. No murmur heard. Pulmonary:     Breath sounds: Normal breath sounds. No wheezing or rales.  Abdominal:     Palpations: Abdomen is soft.     Tenderness: There is no abdominal tenderness.  Musculoskeletal:     Cervical back: Neck supple. No tenderness.     Right lower leg: No edema.     Left lower leg: No edema.  Skin:    General: Skin is warm.     Findings: No rash.  Neurological:     General: No focal deficit present.     Mental Status: She is alert and oriented to person, place, and time.     Cranial  Nerves: No cranial nerve deficit.     Sensory: No sensory deficit.     Motor: No weakness.  Psychiatric:        Mood and Affect: Mood normal.        Behavior: Behavior normal.     BP 130/80 (BP Location: Right Arm, Patient Position: Sitting, Cuff Size: Normal)   Pulse 93   Ht 5\' 3"  (1.6 m)    Wt 170 lb 6.4 oz (77.3 kg)   LMP 05/16/2002   SpO2 97%   BMI 30.19 kg/m  Wt Readings from Last 3 Encounters:  08/11/23 170 lb 6.4 oz (77.3 kg)  02/24/23 174 lb 12.8 oz (79.3 kg)  12/08/22 175 lb (79.4 kg)    Lab Results  Component Value Date   TSH 2.660 03/12/2022   Lab Results  Component Value Date   WBC 5.9 03/12/2022   HGB 14.6 03/12/2022   HCT 44.2 03/12/2022   MCV 88 03/12/2022   PLT 212 03/12/2022   Lab Results  Component Value Date   NA 141 01/14/2023   K 4.2 01/14/2023   CO2 25 01/14/2023   GLUCOSE 109 (H) 01/14/2023   BUN 10 01/14/2023   CREATININE 1.00 01/14/2023   BILITOT 0.4 01/14/2023   ALKPHOS 144 (H) 01/14/2023   AST 22 01/14/2023   ALT 20 01/14/2023   PROT 7.0 01/14/2023   ALBUMIN 4.2 01/14/2023   CALCIUM 9.4 01/14/2023   ANIONGAP 7 12/12/2019   EGFR 61 01/14/2023   Lab Results  Component Value Date   CHOL 139 01/14/2023   Lab Results  Component Value Date   HDL 50 01/14/2023   Lab Results  Component Value Date   LDLCALC 75 01/14/2023   Lab Results  Component Value Date   TRIG 68 01/14/2023   Lab Results  Component Value Date   CHOLHDL 2.8 01/14/2023   Lab Results  Component Value Date   HGBA1C 6.4 (H) 01/14/2023      Assessment & Plan:   Problem List Items Addressed This Visit       Cardiovascular and Mediastinum   Essential hypertension    BP Readings from Last 1 Encounters:  08/11/23 130/80   Well-controlled with amlodipine, HCTZ and Olmesartan Episodes of dizziness likely due to dehydration leading to orthostatic hypotension, needs to maintain adequate hydration as she is currently on ARB and hydrochlorothiazide, which help with HTN and leg swelling Counseled for compliance with the medications Advised DASH diet and moderate exercise/walking, at least 150 mins/week      Relevant Medications   atorvastatin (LIPITOR) 80 MG tablet   hydrochlorothiazide (HYDRODIURIL) 12.5 MG tablet   ezetimibe (ZETIA) 10 MG tablet    metoprolol tartrate (LOPRESSOR) 25 MG tablet   olmesartan (BENICAR) 20 MG tablet   Other Relevant Orders   TSH   CMP14+EGFR   CBC with Differential/Platelet   Coronary artery disease involving native heart without angina pectoris    S/p stent placement, followed by cardiology On aspirin and statin On metoprolol, changed to 25 mg BID as she takes Lopressor No anginal symptoms currently      Relevant Medications   atorvastatin (LIPITOR) 80 MG tablet   hydrochlorothiazide (HYDRODIURIL) 12.5 MG tablet   ezetimibe (ZETIA) 10 MG tablet   metoprolol tartrate (LOPRESSOR) 25 MG tablet   olmesartan (BENICAR) 20 MG tablet     Respiratory   Pulmonary emphysema (HCC)    Noted on CT chest Asymptomatic currently Advised to cut down -> quit smoking  Genitourinary   Stage 3a chronic kidney disease (HCC)    Last 2 CMP showed GFR 58 -> 61, but has h/o poor PO fluid intake Avoid nephrotoxic agents Needs to improve fluid intake On Olmesartan and HCTZ Checked urine microalbumin/creatinine ratio      Relevant Orders   VITAMIN D 25 Hydroxy (Vit-D Deficiency, Fractures)   CMP14+EGFR   CBC with Differential/Platelet   Parathyroid hormone, intact (no Ca)     Other   Hyperlipidemia    On statin, Zetia and omega-3 Check lipid profile      Relevant Medications   atorvastatin (LIPITOR) 80 MG tablet   hydrochlorothiazide (HYDRODIURIL) 12.5 MG tablet   ezetimibe (ZETIA) 10 MG tablet   metoprolol tartrate (LOPRESSOR) 25 MG tablet   olmesartan (BENICAR) 20 MG tablet   Encounter for general adult medical examination with abnormal findings - Primary    Physical exam as documented. Fasting blood tests ordered. Advised to get Shingrix and Tdap vaccine at local pharmacy.      Tobacco abuse    Smoked about 1 pack/day X >45 years, has cut down to 0.5 pack/day now  Asked about quitting: confirms that she currently smokes cigarettes Advise to quit smoking: Educated about QUITTING to  reduce the risk of cancer, cardio and cerebrovascular disease. Assess willingness: Unwilling to quit at this time, but is working on cutting back. Assist with counseling and pharmacotherapy: Counseled for 5 minutes and literature provided. Arrange for follow up: Follow up in 3 months and continue to offer help.      Prediabetes    Lab Results  Component Value Date   HGBA1C 6.4 (H) 01/14/2023   Continue to follow low-carb diet and ambulate as tolerated      Relevant Orders   Hemoglobin A1c   CMP14+EGFR   Screening for lung cancer    Has > 20-pack-year smoking history Ordered low-dose CT chest after discussing with the patient.       Relevant Orders   CT CHEST LUNG CANCER SCREENING LOW DOSE WO CONTRAST   Other Visit Diagnoses     Encounter for immunization       Relevant Orders   Flu Vaccine Trivalent High Dose (Fluad) (Completed)        Meds ordered this encounter  Medications   atorvastatin (LIPITOR) 80 MG tablet    Sig: Take 1 tablet (80 mg total) by mouth daily.    Dispense:  90 tablet    Refill:  3   hydrochlorothiazide (HYDRODIURIL) 12.5 MG tablet    Sig: Take 1 tablet (12.5 mg total) by mouth daily.    Dispense:  90 tablet    Refill:  3   ezetimibe (ZETIA) 10 MG tablet    Sig: Take 1 tablet (10 mg total) by mouth daily.    Dispense:  90 tablet    Refill:  3   metoprolol tartrate (LOPRESSOR) 25 MG tablet    Sig: Take 1 tablet (25 mg total) by mouth daily.    Dispense:  180 tablet    Refill:  1   olmesartan (BENICAR) 20 MG tablet    Sig: Take 1 tablet (20 mg total) by mouth daily.    Dispense:  90 tablet    Refill:  3    Follow-up: Return in about 6 months (around 02/08/2024) for HTN and CAD.    Anabel Halon, MD

## 2023-08-11 NOTE — Assessment & Plan Note (Signed)
Physical exam as documented. Fasting blood tests ordered. Advised to get Shingrix and Tdap vaccine at local pharmacy.

## 2023-08-11 NOTE — Assessment & Plan Note (Addendum)
BP Readings from Last 1 Encounters:  08/11/23 130/80   Well-controlled with amlodipine, HCTZ and Olmesartan Episodes of dizziness likely due to dehydration leading to orthostatic hypotension, needs to maintain adequate hydration as she is currently on ARB and hydrochlorothiazide, which help with HTN and leg swelling Counseled for compliance with the medications Advised DASH diet and moderate exercise/walking, at least 150 mins/week

## 2023-08-11 NOTE — Assessment & Plan Note (Addendum)
Last 2 CMP showed GFR 58 -> 61, but has h/o poor PO fluid intake Avoid nephrotoxic agents Needs to improve fluid intake On Olmesartan and HCTZ Checked urine microalbumin/creatinine ratio

## 2023-08-11 NOTE — Assessment & Plan Note (Signed)
Smoked about 1 pack/day X >45 years, has cut down to 0.5 pack/day now  Asked about quitting: confirms that she currently smokes cigarettes Advise to quit smoking: Educated about QUITTING to reduce the risk of cancer, cardio and cerebrovascular disease. Assess willingness: Unwilling to quit at this time, but is working on cutting back. Assist with counseling and pharmacotherapy: Counseled for 5 minutes and literature provided. Arrange for follow up: Follow up in 3 months and continue to offer help.

## 2023-08-11 NOTE — Assessment & Plan Note (Addendum)
S/p stent placement, followed by cardiology On aspirin and statin On metoprolol, changed to 25 mg BID as she takes Lopressor No anginal symptoms currently

## 2023-08-11 NOTE — Patient Instructions (Addendum)
Please take at least 64 ounces of fluid in a day.  Please continue to take medications as prescribed.  Please continue to follow low carb diet and perform moderate exercise/walking as tolerated.

## 2023-08-11 NOTE — Assessment & Plan Note (Signed)
Noted on CT chest ?Asymptomatic currently ?Advised to cut down -> quit smoking ?

## 2023-08-12 LAB — CBC WITH DIFFERENTIAL/PLATELET
Basophils Absolute: 0.1 10*3/uL (ref 0.0–0.2)
Basos: 1 %
EOS (ABSOLUTE): 0.1 10*3/uL (ref 0.0–0.4)
Eos: 1 %
Hematocrit: 45.9 % (ref 34.0–46.6)
Hemoglobin: 15.1 g/dL (ref 11.1–15.9)
Immature Grans (Abs): 0 10*3/uL (ref 0.0–0.1)
Immature Granulocytes: 0 %
Lymphocytes Absolute: 2.6 10*3/uL (ref 0.7–3.1)
Lymphs: 41 %
MCH: 31.1 pg (ref 26.6–33.0)
MCHC: 32.9 g/dL (ref 31.5–35.7)
MCV: 95 fL (ref 79–97)
Monocytes Absolute: 0.4 10*3/uL (ref 0.1–0.9)
Monocytes: 7 %
Neutrophils Absolute: 3.2 10*3/uL (ref 1.4–7.0)
Neutrophils: 50 %
Platelets: 211 10*3/uL (ref 150–450)
RBC: 4.85 x10E6/uL (ref 3.77–5.28)
RDW: 13.3 % (ref 11.7–15.4)
WBC: 6.4 10*3/uL (ref 3.4–10.8)

## 2023-08-12 LAB — CMP14+EGFR
ALT: 25 IU/L (ref 0–32)
AST: 32 IU/L (ref 0–40)
Albumin: 4.3 g/dL (ref 3.9–4.9)
Alkaline Phosphatase: 140 IU/L — ABNORMAL HIGH (ref 44–121)
BUN/Creatinine Ratio: 7 — ABNORMAL LOW (ref 12–28)
BUN: 8 mg/dL (ref 8–27)
Bilirubin Total: 0.3 mg/dL (ref 0.0–1.2)
CO2: 20 mmol/L (ref 20–29)
Calcium: 9.1 mg/dL (ref 8.7–10.3)
Chloride: 98 mmol/L (ref 96–106)
Creatinine, Ser: 1.08 mg/dL — ABNORMAL HIGH (ref 0.57–1.00)
Globulin, Total: 2.7 g/dL (ref 1.5–4.5)
Glucose: 101 mg/dL — ABNORMAL HIGH (ref 70–99)
Potassium: 3.8 mmol/L (ref 3.5–5.2)
Sodium: 138 mmol/L (ref 134–144)
Total Protein: 7 g/dL (ref 6.0–8.5)
eGFR: 56 mL/min/{1.73_m2} — ABNORMAL LOW (ref >59)

## 2023-08-12 LAB — VITAMIN D 25 HYDROXY (VIT D DEFICIENCY, FRACTURES): Vit D, 25-Hydroxy: 60.9 ng/mL (ref 30.0–100.0)

## 2023-08-12 LAB — HEMOGLOBIN A1C
Est. average glucose Bld gHb Est-mCnc: 140 mg/dL
Hgb A1c MFr Bld: 6.5 % — ABNORMAL HIGH (ref 4.8–5.6)

## 2023-08-12 LAB — PARATHYROID HORMONE, INTACT (NO CA)

## 2023-08-12 LAB — TSH: TSH: 2.09 u[IU]/mL (ref 0.450–4.500)

## 2023-09-02 ENCOUNTER — Other Ambulatory Visit (HOSPITAL_COMMUNITY): Payer: Self-pay | Admitting: Internal Medicine

## 2023-09-02 DIAGNOSIS — Z0000 Encounter for general adult medical examination without abnormal findings: Secondary | ICD-10-CM

## 2023-09-10 ENCOUNTER — Ambulatory Visit (HOSPITAL_COMMUNITY): Payer: PPO

## 2023-09-13 ENCOUNTER — Ambulatory Visit (HOSPITAL_COMMUNITY)
Admission: RE | Admit: 2023-09-13 | Discharge: 2023-09-13 | Disposition: A | Discharge status: Home / Self Care | Payer: PPO | Source: Ambulatory Visit | Attending: Internal Medicine | Admitting: Internal Medicine

## 2023-09-13 DIAGNOSIS — Z0000 Encounter for general adult medical examination without abnormal findings: Secondary | ICD-10-CM | POA: Diagnosis present

## 2023-09-13 DIAGNOSIS — Z122 Encounter for screening for malignant neoplasm of respiratory organs: Secondary | ICD-10-CM | POA: Insufficient documentation

## 2023-09-13 DIAGNOSIS — Z1231 Encounter for screening mammogram for malignant neoplasm of breast: Secondary | ICD-10-CM | POA: Diagnosis not present

## 2023-09-13 DIAGNOSIS — F17210 Nicotine dependence, cigarettes, uncomplicated: Secondary | ICD-10-CM | POA: Diagnosis not present

## 2023-11-25 ENCOUNTER — Other Ambulatory Visit: Payer: Self-pay | Admitting: Internal Medicine

## 2023-11-25 DIAGNOSIS — I10 Essential (primary) hypertension: Secondary | ICD-10-CM

## 2023-11-30 ENCOUNTER — Ambulatory Visit: Payer: PPO | Admitting: Cardiology

## 2024-02-08 ENCOUNTER — Ambulatory Visit: Payer: PPO | Admitting: Internal Medicine

## 2024-02-08 ENCOUNTER — Encounter: Payer: Self-pay | Admitting: Internal Medicine

## 2024-02-08 VITALS — BP 131/71 | HR 88 | Ht 63.0 in | Wt 171.2 lb

## 2024-02-08 DIAGNOSIS — E1169 Type 2 diabetes mellitus with other specified complication: Secondary | ICD-10-CM | POA: Insufficient documentation

## 2024-02-08 DIAGNOSIS — I2510 Atherosclerotic heart disease of native coronary artery without angina pectoris: Secondary | ICD-10-CM | POA: Diagnosis not present

## 2024-02-08 DIAGNOSIS — I10 Essential (primary) hypertension: Secondary | ICD-10-CM

## 2024-02-08 DIAGNOSIS — Z720 Tobacco use: Secondary | ICD-10-CM | POA: Diagnosis not present

## 2024-02-08 DIAGNOSIS — N1831 Chronic kidney disease, stage 3a: Secondary | ICD-10-CM | POA: Diagnosis not present

## 2024-02-08 DIAGNOSIS — E119 Type 2 diabetes mellitus without complications: Secondary | ICD-10-CM

## 2024-02-08 HISTORY — DX: Type 2 diabetes mellitus without complications: E11.9

## 2024-02-08 MED ORDER — AMLODIPINE BESYLATE 10 MG PO TABS: 10.0000 mg | ORAL_TABLET | Freq: Every day | ORAL | 1 refills | Status: DC

## 2024-02-08 MED ORDER — METOPROLOL TARTRATE 25 MG PO TABS: 25.0000 mg | ORAL_TABLET | Freq: Every day | ORAL | 1 refills | Status: AC

## 2024-02-08 NOTE — Assessment & Plan Note (Signed)
 Lab Results  Component Value Date   HGBA1C 6.5 (H) 08/11/2023    New onset Associated with HTN, CAD, HLD and CKD She would be an excellent candidate for GLP-1 agonist therapy considering her history of CAD and CKD - check HbA1c, she agrees to start Ozempic if HbA1c is elevated > 7 Advised to follow diabetic diet On ARB and statin F/u CMP and lipid panel Diabetic foot exam: Today Diabetic eye exam: Advised to follow up with Ophthalmology for diabetic eye exam

## 2024-02-08 NOTE — Progress Notes (Signed)
 Established Patient Office Visit  Subjective:  Patient ID: Leah [name], female    DOB: 1956-06-03  Age: 68 y.o. MRN: [id number]  CC:  Chief Complaint  Patient presents with   Care Management    6 month f/u, reports d/c vitamin d caused constipation.     HPI Leah [name] is a 68 y.o. female with past medical history of CAD s/p stent placement, HTN, DM, CKD, emphysema, HLD, OA and tobacco abuse who presents for f/u of her chronic medical conditions.  CAD: She has had stent placed to RCA in 1998.  She denies any anginal pain, dyspnea or palpitations currently.  She is on aspirin, statin, Zetia and metoprolol currently.  She follows up with cardiology. BP is well-controlled. Takes medications regularly. Patient denies headache, dizziness, chest pain, dyspnea or palpitations.  Type 2 DM: Her last HbA1c was 6.5 in 09/24. She has long-standing h/o prediabetes.  She admits that she needs to cut down soft drink intake.  Denies polyuria or polyphagia currently.  CKD: Her last CMP showed stable GFR at 56, but states that she has been taking only 24 ounces of fluid in a day now. Her ALP has been borderline elevated.  She denies any dysuria, hematuria or urinary retention or resistance.  She takes vitamin D, vitamin B12 supplements. She has constipation from Vitamin D supplement.   Past Medical History:  Diagnosis Date   Arthritis    Borderline diabetes    Breast lump on right side at 3 o'clock position    Benign, fatty mass   Diabetes mellitus (HCC) 02/08/2024   History of kidney stones    History of shingles    Hyperlipidemia    Hypertension    Myocardial infarction (HCC) 1996    HAD ANGIOPLASTY WITH 2 STENTS   Tinnitus    right ear    Vitamin D deficiency 10/17/2019   Wears glasses     Past Surgical History:  Procedure Laterality Date   BREAST LUMPECTOMY  >10 YRS AGO   RT BREAST   COLONOSCOPY WITH PROPOFOL N/A 04/22/2021   Procedure: COLONOSCOPY WITH PROPOFOL;  Surgeon:  Lanelle Bal, DO;  Location: [facility];  Service: Endoscopy;  Laterality: N/A;  11:45am   CORONARY ANGIOPLASTY  1996   ANGIOPLASTY / 2 STENTS   CYSTOSCOPY WITH STENT PLACEMENT Right 12/30/2015   Procedure: CYSTOSCOPY;  Surgeon: Malen Gauze, MD;  Location: [facility];  Service: Urology;  Laterality: Right;   CYSTOSCOPY WITH STENT PLACEMENT Right 01/09/2016   Procedure: CYSTOSCOPY WITH STENT PLACEMENT;  Surgeon: Malen Gauze, MD;  Location: [facility];  Service: Urology;  Laterality: Right;   HOLMIUM LASER APPLICATION Right 01/09/2016   Procedure: HOLMIUM LASER APPLICATION;  Surgeon: Malen Gauze, MD;  Location: [facility];  Service: Urology;  Laterality: Right;   LITHOTRIPSY     NEPHROLITHOTOMY Right 12/30/2015   Procedure: RIght Nephrostomy with access;  Surgeon: Malen Gauze, MD;  Location: [facility];  Service: Urology;  Laterality: Right;   NEPHROLITHOTOMY Right 01/09/2016   Procedure: RIGHT PERCUTANEOUS NEPHROLITHOTOMY SECOND LOOK , POSSIBLE DILATION OF TRACT;  Surgeon: Malen Gauze, MD;  Location: [facility];  Service: Urology;  Laterality: Right;   POLYPECTOMY  04/22/2021   Procedure: POLYPECTOMY;  Surgeon: Lanelle Bal, DO;  Location: [facility];  Service: Endoscopy;;    Family History  Problem Relation Age of Onset   Heart disease Mother    Diabetes Mother    Cancer Father  bone   Cirrhosis Father    Stroke Maternal Grandmother    Stroke Maternal Grandfather    Heart disease Sister    Obesity Brother    Healthy Daughter     Social History   Socioeconomic History   Marital status: Widowed    Spouse name: Not on file   Number of children: 1   Years of education: 104   Highest education level: Master's degree (e.g., MA, MS, MEng, MEd, MSW, MBA)  Occupational History   Occupation: Retired  Tobacco Use   Smoking status: Every Day    Current packs/day: 1.00    Average packs/day: 1 pack/day for 40.0 years (40.0 ttl pk-yrs)    Types: Cigarettes    Smokeless tobacco: Never  Vaping Use   Vaping status: Never Used  Substance and Sexual Activity   Alcohol use: No    Alcohol/week: 0.0 standard drinks of alcohol   Drug use: No   Sexual activity: Yes  Other Topics Concern   Not on file  Social History Narrative   Single,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   Social Drivers of Corporate investment banker Strain: Low Risk  (11/25/2021)   Overall Financial Resource Strain (CARDIA)    Difficulty of Paying Living Expenses: Not hard at all  Food Insecurity: No Food Insecurity (06/19/2022)   Hunger Vital Sign    Worried About Running Out of Food in the Last Year: Never true    Ran Out of Food in the Last Year: Never true  Transportation Needs: No Transportation Needs (06/10/2023)   PRAPARE - Administrator, Civil Service (Medical): No    Lack of Transportation (Non-Medical): No  Physical Activity: Sufficiently Active (11/25/2021)   Exercise Vital Sign    Days of Exercise per Week: 5 days    Minutes of Exercise per Session: 30 min  Stress: No Stress Concern Present (11/25/2021)   [employer] of Occupational Health - Occupational Stress Questionnaire    Feeling of Stress : Not at all  Social Connections: Socially Isolated (11/25/2021)   Social Connection and Isolation Panel [NHANES]    Frequency of Communication with Friends and Family: More than three times a week    Frequency of Social Gatherings with Friends and Family: Once a week    Attends Religious Services: Never    Database administrator or Organizations: No    Attends Banker Meetings: Never    Marital Status: Widowed  Intimate Partner Violence: Not At Risk (11/25/2021)   Humiliation, Afraid, Rape, and Kick questionnaire    Fear of Current or Ex-Partner: No    Emotionally Abused: No    Physically Abused: No    Sexually Abused: No    Outpatient Medications Prior to Visit  Medication Sig Dispense Refill   aspirin EC 81 MG tablet Take 81 mg by  mouth every morning.      atorvastatin (LIPITOR) 80 MG tablet Take 1 tablet (80 mg total) by mouth daily. 90 tablet 3   Blood Pressure Monitoring (BLOOD PRESSURE MONITOR AUTOMAT) DEVI 1 Device by Does not apply route daily. Check blood pressure daily 1 each 0   cyanocobalamin (VITAMIN B12) 1000 MCG tablet Take 1,000 mcg by mouth daily.     ezetimibe (ZETIA) 10 MG tablet Take 1 tablet (10 mg total) by mouth daily. 90 tablet 3   hydrochlorothiazide (HYDRODIURIL) 12.5 MG tablet Take 1 tablet (12.5 mg total) by mouth daily. 90 tablet 3   ibuprofen (ADVIL) 200 MG tablet Take  400 mg by mouth every 6 (six) hours as needed for headache or moderate pain.     Multiple Vitamin (MULTIVITAMIN WITH MINERALS) TABS tablet Take 1 tablet by mouth every morning.     olmesartan (BENICAR) 20 MG tablet Take 1 tablet (20 mg total) by mouth daily. 90 tablet 3   Omega 3 1000 MG CAPS Take 1,000 mg by mouth daily.     vitamin E 400 UNIT capsule Take 400 Units by mouth daily.     amLODipine (NORVASC) 10 MG tablet TAKE ONE TABLET BY MOUTH ONCE DAILY. 90 tablet 0   metoprolol tartrate (LOPRESSOR) 25 MG tablet Take 1 tablet (25 mg total) by mouth daily. 180 tablet 1   nicotine (NICODERM CQ - DOSED IN MG/24 HOURS) 14 mg/24hr patch Place 1 patch (14 mg total) onto the skin daily. 28 patch 1   Ergocalciferol (VITAMIN D2) 50 MCG (2000 UT) TABS Take 2,000 Units by mouth daily. (Patient not taking: Reported on 02/08/2024)     No facility-administered medications prior to visit.    Allergies  Allergen Reactions   Codeine     REACTION: " Makes me high and blacked out in the 1980s"    ROS Review of Systems  Constitutional:  Negative for chills and fever.  HENT:  Positive for tinnitus. Negative for congestion, sinus pressure, sinus pain and sore throat.        Hearing problem  Eyes:  Negative for pain and discharge.  Respiratory:  Negative for cough and shortness of breath.   Cardiovascular:  Negative for chest pain and  palpitations.  Gastrointestinal:  Negative for abdominal pain, constipation, diarrhea, nausea and vomiting.  Endocrine: Negative for polydipsia and polyuria.  Genitourinary:  Negative for dysuria and hematuria.  Musculoskeletal:  Negative for neck pain and neck stiffness.  Skin:  Negative for rash.  Neurological:  Negative for dizziness and weakness.  Psychiatric/Behavioral:  Negative for agitation and behavioral problems.       Objective:    Physical Exam Vitals reviewed.  Constitutional:      General: She is not in acute distress.    Appearance: She is not diaphoretic.  HENT:     Head: Normocephalic and atraumatic.     Nose: Nose normal.     Mouth/Throat:     Mouth: Mucous membranes are moist.  Eyes:     General: No scleral icterus.    Extraocular Movements: Extraocular movements intact.  Neck:     Vascular: No carotid bruit.  Cardiovascular:     Rate and Rhythm: Normal rate and regular rhythm.     Heart sounds: Normal heart sounds. No murmur heard. Pulmonary:     Breath sounds: Normal breath sounds. No wheezing or rales.  Abdominal:     Palpations: Abdomen is soft.     Tenderness: There is no abdominal tenderness.  Musculoskeletal:     Cervical back: Neck supple. No tenderness.     Right lower leg: No edema.     Left lower leg: No edema.  Skin:    General: Skin is warm.     Findings: No rash.  Neurological:     General: No focal deficit present.     Mental Status: She is alert and oriented to person, place, and time.     Cranial Nerves: No cranial nerve deficit.     Sensory: No sensory deficit.     Motor: No weakness.  Psychiatric:        Mood and Affect: Mood normal.  Behavior: Behavior normal.     BP 131/71   Pulse 88   Ht 5\' 3"  (1.6 m)   Wt 171 lb 3.2 oz (77.7 kg)   LMP 05/16/2002   SpO2 97%   BMI 30.33 kg/m  Wt Readings from Last 3 Encounters:  02/08/24 171 lb 3.2 oz (77.7 kg)  08/11/23 170 lb 6.4 oz (77.3 kg)  02/24/23 174 lb 12.8 oz  (79.3 kg)    Lab Results  Component Value Date   TSH 2.090 08/11/2023   Lab Results  Component Value Date   WBC 6.4 08/11/2023   HGB 15.1 08/11/2023   HCT 45.9 08/11/2023   MCV 95 08/11/2023   PLT 211 08/11/2023   Lab Results  Component Value Date   NA 138 08/11/2023   K 3.8 08/11/2023   CO2 20 08/11/2023   GLUCOSE 101 (H) 08/11/2023   BUN 8 08/11/2023   CREATININE 1.08 (H) 08/11/2023   BILITOT 0.3 08/11/2023   ALKPHOS 140 (H) 08/11/2023   AST 32 08/11/2023   ALT 25 08/11/2023   PROT 7.0 08/11/2023   ALBUMIN 4.3 08/11/2023   CALCIUM 9.1 08/11/2023   ANIONGAP 7 12/12/2019   EGFR 56 (L) 08/11/2023   Lab Results  Component Value Date   CHOL 139 01/14/2023   Lab Results  Component Value Date   HDL 50 01/14/2023   Lab Results  Component Value Date   LDLCALC 75 01/14/2023   Lab Results  Component Value Date   TRIG 68 01/14/2023   Lab Results  Component Value Date   CHOLHDL 2.8 01/14/2023   Lab Results  Component Value Date   HGBA1C 6.5 (H) 08/11/2023      Assessment & Plan:   Problem List Items Addressed This Visit       Cardiovascular and Mediastinum   Essential hypertension   BP Readings from Last 1 Encounters:  02/08/24 131/71   Well-controlled with amlodipine, HCTZ and Olmesartan Counseled for compliance with the medications Advised DASH diet and moderate exercise/walking, at least 150 mins/week      Relevant Medications   amLODipine (NORVASC) 10 MG tablet   metoprolol tartrate (LOPRESSOR) 25 MG tablet   Coronary artery disease involving native heart without angina pectoris   S/p stent placement, followed by cardiology On aspirin and statin On Lopressor 25 mg QD No anginal symptoms currently      Relevant Medications   amLODipine (NORVASC) 10 MG tablet   metoprolol tartrate (LOPRESSOR) 25 MG tablet     Endocrine   Type 2 diabetes mellitus with other specified complication (HCC)   Lab Results  Component Value Date   HGBA1C 6.5  (H) 08/11/2023    New onset Associated with HTN, CAD, HLD and CKD She would be an excellent candidate for GLP-1 agonist therapy considering her history of CAD and CKD - check HbA1c, she agrees to start Ozempic if HbA1c is elevated > 7 Advised to follow diabetic diet On ARB and statin F/u CMP and lipid panel Diabetic foot exam: Today Diabetic eye exam: Advised to follow up with Ophthalmology for diabetic eye exam      Relevant Orders   CMP14+EGFR   Hemoglobin A1c   Urine Microalbumin w/creat. ratio     Genitourinary   Stage 3a chronic kidney disease (HCC) - Primary   Last 2 CMP showed GFR ~55-60, but has h/o poor PO fluid intake Avoid nephrotoxic agents Needs to improve fluid intake On Olmesartan and HCTZ Checked urine microalbumin/creatinine ratio  Relevant Orders   CMP14+EGFR     Other   Tobacco abuse   Smoked about 1 pack/day X >45 years, has cut down to 0.5 pack/day now  Asked about quitting: confirms that she currently smokes cigarettes Advise to quit smoking: Educated about QUITTING to reduce the risk of cancer, cardio and cerebrovascular disease. Assess willingness: Unwilling to quit at this time, but is working on cutting back. Assist with counseling and pharmacotherapy: Counseled for 5 minutes and literature provided. Arrange for follow up: Follow up in 3 months and continue to offer help.         Meds ordered this encounter  Medications   amLODipine (NORVASC) 10 MG tablet    Sig: Take 1 tablet (10 mg total) by mouth daily.    Dispense:  90 tablet    Refill:  1   metoprolol tartrate (LOPRESSOR) 25 MG tablet    Sig: Take 1 tablet (25 mg total) by mouth daily.    Dispense:  180 tablet    Refill:  1    Follow-up: Return in about 4 months (around 06/09/2024) for DM and CKD.    Anabel Halon, MD

## 2024-02-08 NOTE — Assessment & Plan Note (Signed)
 BP Readings from Last 1 Encounters:  02/08/24 131/71   Well-controlled with amlodipine, HCTZ and Olmesartan Counseled for compliance with the medications Advised DASH diet and moderate exercise/walking, at least 150 mins/week

## 2024-02-08 NOTE — Assessment & Plan Note (Signed)
Smoked about 1 pack/day X >45 years, has cut down to 0.5 pack/day now  Asked about quitting: confirms that she currently smokes cigarettes Advise to quit smoking: Educated about QUITTING to reduce the risk of cancer, cardio and cerebrovascular disease. Assess willingness: Unwilling to quit at this time, but is working on cutting back. Assist with counseling and pharmacotherapy: Counseled for 5 minutes and literature provided. Arrange for follow up: Follow up in 3 months and continue to offer help.

## 2024-02-08 NOTE — Assessment & Plan Note (Addendum)
 S/p stent placement, followed by cardiology On aspirin and statin On Lopressor 25 mg QD No anginal symptoms currently

## 2024-02-08 NOTE — Assessment & Plan Note (Signed)
 Last 2 CMP showed GFR ~55-60, but has h/o poor PO fluid intake Avoid nephrotoxic agents Needs to improve fluid intake On Olmesartan and HCTZ Checked urine microalbumin/creatinine ratio

## 2024-02-08 NOTE — Patient Instructions (Signed)
 Please continue to take medications as prescribed.  Please continue to follow low carb diet and perform moderate exercise/walking at least 150 mins/week.

## 2024-02-09 LAB — CMP14+EGFR
ALT: 20 IU/L (ref 0–32)
AST: 20 IU/L (ref 0–40)
Albumin: 4.1 g/dL (ref 3.9–4.9)
Alkaline Phosphatase: 164 IU/L — ABNORMAL HIGH (ref 44–121)
BUN/Creatinine Ratio: 9 — ABNORMAL LOW (ref 12–28)
BUN: 9 mg/dL (ref 8–27)
Bilirubin Total: 0.4 mg/dL (ref 0.0–1.2)
CO2: 26 mmol/L (ref 20–29)
Calcium: 9.5 mg/dL (ref 8.7–10.3)
Chloride: 101 mmol/L (ref 96–106)
Creatinine, Ser: 1.04 mg/dL — ABNORMAL HIGH (ref 0.57–1.00)
Globulin, Total: 2.7 g/dL (ref 1.5–4.5)
Glucose: 102 mg/dL — ABNORMAL HIGH (ref 70–99)
Potassium: 4.2 mmol/L (ref 3.5–5.2)
Sodium: 140 mmol/L (ref 134–144)
Total Protein: 6.8 g/dL (ref 6.0–8.5)
eGFR: 58 mL/min/{1.73_m2} — ABNORMAL LOW (ref >59)

## 2024-02-09 LAB — HEMOGLOBIN A1C
Est. average glucose Bld gHb Est-mCnc: 131 mg/dL
Hgb A1c MFr Bld: 6.2 % — ABNORMAL HIGH (ref 4.8–5.6)

## 2024-02-10 LAB — MICROALBUMIN / CREATININE URINE RATIO
Creatinine, Urine: 119.7 mg/dL
Microalb/Creat Ratio: 78 mg/g{creat} — ABNORMAL HIGH (ref 0–29)
Microalbumin, Urine: 93.1 ug/mL

## 2024-02-22 ENCOUNTER — Ambulatory Visit (INDEPENDENT_AMBULATORY_CARE_PROVIDER_SITE_OTHER): Payer: PPO

## 2024-02-22 VITALS — BP 131/71 | Ht 63.0 in | Wt 171.0 lb

## 2024-02-22 DIAGNOSIS — Z0000 Encounter for general adult medical examination without abnormal findings: Secondary | ICD-10-CM

## 2024-02-22 NOTE — Progress Notes (Signed)
 Because this visit was a virtual/telehealth visit,  certain criteria was not obtained, such a blood pressure, CBG if applicable, and timed get up and go. Any medications not marked as "taking" were not mentioned during the medication reconciliation part of the visit. Any vitals not documented were not able to be obtained due to this being a telehealth visit or patient was unable to self-report a recent blood pressure reading due to a lack of equipment at home via telehealth. Vitals that have been documented are verbally provided by the patient.   Subjective:   Leah [name] is a 68 y.o. who presents for a Medicare Wellness preventive visit.  Visit Complete: Virtual I connected with  Leah [name] on 02/22/24 by a audio enabled telemedicine application and verified that I am speaking with the correct person using two identifiers.  Patient Location: Home  Provider Location: Home Office  I discussed the limitations of evaluation and management by telemedicine. The patient expressed understanding and agreed to proceed.  Vital Signs: Because this visit was a virtual/telehealth visit, some criteria may be missing or patient reported. Any vitals not documented were not able to be obtained and vitals that have been documented are patient reported.  VideoDeclined- This patient declined Librarian, academic. Therefore the visit was completed with audio only.  Persons Participating in Visit: Patient.  AWV Questionnaire: No: Patient Medicare AWV questionnaire was not completed prior to this visit.  Cardiac Risk Factors include: advanced age (>92men, >24 women);diabetes mellitus;dyslipidemia;hypertension;obesity (BMI >30kg/m2);sedentary lifestyle;smoking/ tobacco exposure     Objective:    Today's Vitals   02/22/24 0857 02/22/24 0904  BP: 131/71   Weight: 171 lb (77.6 kg)   Height: 5\' 3"  (1.6 m)   PainSc:  0-No pain   Body mass index is 30.29 kg/m.     02/22/2024     9:28 AM 02/02/2023    9:31 AM 11/25/2021    9:39 AM 04/22/2021   10:08 AM 04/18/2021    9:12 AM 06/05/2020    2:53 PM 01/24/2020    2:44 PM  Advanced Directives  Does Patient Have a Medical Advance Directive? No Yes No No No No No  Does patient want to make changes to medical advance directive?      No - Patient declined   Would patient like information on creating a medical advance directive? No - Patient declined  No - Patient declined No - Patient declined No - Patient declined No - Patient declined No - Patient declined    Current Medications (verified) Outpatient Encounter Medications as of 02/22/2024  Medication Sig   amLODipine (NORVASC) 10 MG tablet Take 1 tablet (10 mg total) by mouth daily.   aspirin EC 81 MG tablet Take 81 mg by mouth every morning.    atorvastatin (LIPITOR) 80 MG tablet Take 1 tablet (80 mg total) by mouth daily.   Blood Pressure Monitoring (BLOOD PRESSURE MONITOR AUTOMAT) DEVI 1 Device by Does not apply route daily. Check blood pressure daily   cyanocobalamin (VITAMIN B12) 1000 MCG tablet Take 1,000 mcg by mouth daily.   ezetimibe (ZETIA) 10 MG tablet Take 1 tablet (10 mg total) by mouth daily.   hydrochlorothiazide (HYDRODIURIL) 12.5 MG tablet Take 1 tablet (12.5 mg total) by mouth daily.   ibuprofen (ADVIL) 200 MG tablet Take 400 mg by mouth every 6 (six) hours as needed for headache or moderate pain.   metoprolol tartrate (LOPRESSOR) 25 MG tablet Take 1 tablet (25 mg total) by  mouth daily.   Multiple Vitamin (MULTIVITAMIN WITH MINERALS) TABS tablet Take 1 tablet by mouth every morning.   olmesartan (BENICAR) 20 MG tablet Take 1 tablet (20 mg total) by mouth daily.   Omega 3 1000 MG CAPS Take 1,000 mg by mouth daily.   vitamin E 400 UNIT capsule Take 400 Units by mouth daily.   No facility-administered encounter medications on file as of 02/22/2024.    Allergies (verified) Codeine   History: Past Medical History:  Diagnosis Date   Arthritis    Borderline  diabetes    Breast lump on right side at 3 o'clock position    Benign, fatty mass   Diabetes mellitus (HCC) 02/08/2024   History of kidney stones    History of shingles    Hyperlipidemia    Hypertension    Myocardial infarction (HCC) 1996    HAD ANGIOPLASTY WITH 2 STENTS   Tinnitus    right ear    Vitamin D deficiency 10/17/2019   Wears glasses    Past Surgical History:  Procedure Laterality Date   BREAST LUMPECTOMY  >10 YRS AGO   RT BREAST   COLONOSCOPY WITH PROPOFOL N/A 04/22/2021   Procedure: COLONOSCOPY WITH PROPOFOL;  Surgeon: Lanelle Bal, DO;  [facility];  Service: Endoscopy;  Laterality: N/A;  11:45am   CORONARY ANGIOPLASTY  1996   ANGIOPLASTY / 2 STENTS   CYSTOSCOPY WITH STENT PLACEMENT Right 12/30/2015   Procedure: CYSTOSCOPY;  Surgeon: Malen Gauze, MD;  Location: [facility];  Service: Urology;  Laterality: Right;   CYSTOSCOPY WITH STENT PLACEMENT Right 01/09/2016   Procedure: CYSTOSCOPY WITH STENT PLACEMENT;  Surgeon: Malen Gauze, MD;  Location: [facility];  Service: Urology;  Laterality: Right;   HOLMIUM LASER APPLICATION Right 01/09/2016   Procedure: HOLMIUM LASER APPLICATION;  Surgeon: Malen Gauze, MD;  Location: [facility];  Service: Urology;  Laterality: Right;   LITHOTRIPSY     NEPHROLITHOTOMY Right 12/30/2015   Procedure: RIght Nephrostomy with access;  Surgeon: Malen Gauze, MD;  Location: [facility];  Service: Urology;  Laterality: Right;   NEPHROLITHOTOMY Right 01/09/2016   Procedure: RIGHT PERCUTANEOUS NEPHROLITHOTOMY SECOND LOOK , POSSIBLE DILATION OF TRACT;  Surgeon: Malen Gauze, MD;  Location: [facility];  Service: Urology;  Laterality: Right;   POLYPECTOMY  04/22/2021   Procedure: POLYPECTOMY;  Surgeon: Lanelle Bal, DO;  [facility];  Service: Endoscopy;;   Family History  Problem Relation Age of Onset   Heart disease Mother    Diabetes Mother    Cancer Father        bone   Cirrhosis Father    Stroke Maternal  Grandmother    Stroke Maternal Grandfather    Heart disease Sister    Obesity Brother    Healthy Daughter    Social History   Socioeconomic History   Marital status: Widowed    Spouse name: Not on file   Number of children: 1   Years of education: 40   Highest education level: Master's degree (e.g., MA, MS, MEng, MEd, MSW, MBA)  Occupational History   Occupation: Retired  Tobacco Use   Smoking status: Every Day    Current packs/day: 1.00    Average packs/day: 1 pack/day for 40.0 years (40.0 ttl pk-yrs)    Types: Cigarettes   Smokeless tobacco: Never  Vaping Use   Vaping status: Never Used  Substance and Sexual Activity   Alcohol use: No    Alcohol/week: 0.0  standard drinks of alcohol   Drug use: No   Sexual activity: Yes  Other Topics Concern   Not on file  Social History Narrative   Single,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   Social Drivers of Corporate investment banker Strain: Low Risk  (11/25/2021)   Overall Financial Resource Strain (CARDIA)    Difficulty of Paying Living Expenses: Not hard at all  Food Insecurity: No Food Insecurity (06/19/2022)   Hunger Vital Sign    Worried About Running Out of Food in the Last Year: Never true    Ran Out of Food in the Last Year: Never true  Transportation Needs: No Transportation Needs (06/10/2023)   PRAPARE - Administrator, Civil Service (Medical): No    Lack of Transportation (Non-Medical): No  Physical Activity: Insufficiently Active (02/22/2024)   Exercise Vital Sign    Days of Exercise per Week: 7 days    Minutes of Exercise per Session: 20 min  Stress: No Stress Concern Present (11/25/2021)   [employer] of Occupational Health - Occupational Stress Questionnaire    Feeling of Stress : Not at all  Social Connections: Socially Isolated (11/25/2021)   Social Connection and Isolation Panel [NHANES]    Frequency of Communication with Friends and Family: More than three times a week    Frequency of Social  Gatherings with Friends and Family: Once a week    Attends Religious Services: Never    Database administrator or Organizations: No    Attends Banker Meetings: Never    Marital Status: Widowed    Tobacco Counseling Ready to quit: Yes Counseling given: Yes    Clinical Intake:  Pre-visit preparation completed: Yes  Pain : No/denies pain Pain Score: 0-No pain     BMI - recorded: 30.29 Nutritional Status: BMI > 30  Obese Nutritional Risks: None Diabetes: No  Lab Results  Component Value Date   HGBA1C 6.2 (H) 02/08/2024   HGBA1C 6.5 (H) 08/11/2023   HGBA1C 6.4 (H) 01/14/2023     How often do you need to have someone help you when you read instructions, pamphlets, or other written materials from your doctor or pharmacy?: 1 - Never  Interpreter Needed?: No  Information entered by :: Maryjean Ka CMA   Activities of Daily Living     02/22/2024    9:28 AM  In your present state of health, do you have any difficulty performing the following activities:  Hearing? 0  Vision? 0  Difficulty concentrating or making decisions? 0  Walking or climbing stairs? 0  Dressing or bathing? 0  Doing errands, shopping? 0  Preparing Food and eating ? N  Using the Toilet? N  In the past six months, have you accidently leaked urine? N  Do you have problems with loss of bowel control? N  Managing your Medications? N  Managing your Finances? N  Housekeeping or managing your Housekeeping? N    Patient Care Team: Anabel Halon, MD as PCP - General (Internal Medicine) Wyline Mood Dorothe Pea, MD as PCP - Cardiology (Cardiology) Lanelle Bal, DO as Consulting Physician (Internal Medicine)  Indicate any recent Medical Services you may have received from other than [facility] providers in the past year (date may be approximate).     Assessment:   This is a routine wellness examination for [facility].  Hearing/Vision screen Hearing Screening - Comments:: Patient denies any hearing  difficulties.   Vision Screening - Comments:: Wears rx glasses - up to date with  routine eye exams  Patient sees Dr. Daisy Lazar w/ [facility] [place] office.     Goals Addressed             This Visit's Progress    Patient Stated       Remain active and healthy       Depression Screen     02/22/2024    9:30 AM 02/08/2024   10:02 AM 08/11/2023    9:58 AM 02/24/2023   10:55 AM 02/02/2023    9:34 AM 01/12/2023    8:34 AM 09/11/2022    8:50 AM  PHQ 2/9 Scores  PHQ - 2 Score 0 0 0 0 0 0 0  PHQ- 9 Score 0 0         Fall Risk     02/22/2024    9:28 AM 02/08/2024   10:03 AM 08/11/2023    9:58 AM 02/24/2023   10:55 AM 02/02/2023    9:34 AM  Fall Risk   Falls in the past year? 0 0 0 0 0  Number falls in past yr: 0 0 0 0   Injury with Fall? 0 0 0 0   Risk for fall due to : No Fall Risks No Fall Risks     Follow up Falls prevention discussed;Falls evaluation completed Falls evaluation completed       MEDICARE RISK AT HOME:  Medicare Risk at Home Any stairs in or around the home?: No If so, are there any without handrails?: No Home free of loose throw rugs in walkways, pet beds, electrical cords, etc?: Yes Adequate lighting in your home to reduce risk of falls?: Yes Life alert?: No Use of a cane, walker or w/c?: No Grab bars in the bathroom?: Yes Shower chair or bench in shower?: No Elevated toilet seat or a handicapped toilet?: No  TIMED UP AND GO:  Was the test performed?  No  Cognitive Function: 6CIT completed        02/22/2024    9:11 AM 02/02/2023    9:34 AM 11/25/2021    9:42 AM  6CIT Screen  What Year? 0 points 0 points 0 points  What month? 0 points 0 points 0 points  What time? 0 points 0 points 0 points  Count back from 20 0 points 0 points 0 points  Months in reverse 0 points 0 points 0 points  Repeat phrase 0 points 0 points 0 points  Total Score 0 points 0 points 0 points    Immunizations Immunization History  Administered Date(s)  Administered   Fluad Quad(high Dose 65+) 10/17/2019, 09/04/2020, 09/10/2021, 09/11/2022   Fluad Trivalent(High Dose 65+) 08/11/2023   H1N1 10/10/2008   Influenza Whole 12/07/2006, 08/29/2008   Influenza,inj,Quad PF,6+ Mos 01/10/2016   PFIZER(Purple Top)SARS-COV-2 Vaccination 02/29/2020, 03/22/2020, 09/27/2020, 03/07/2021   PNEUMOCOCCAL CONJUGATE-20 09/10/2021   Pneumococcal Polysaccharide-23 07/13/2008, 10/17/2019    Screening Tests Health Maintenance  Topic Date Due   OPHTHALMOLOGY EXAM  Never done   DTaP/Tdap/Td (1 - Tdap) Never done   Zoster Vaccines- Shingrix (1 of 2) Never done   COVID-19 Vaccine (5 - 2024-25 season) 07/18/2023   INFLUENZA VACCINE  06/16/2024   HEMOGLOBIN A1C  08/10/2024   Medicare Annual Wellness (AWV)  09/01/2024   Lung Cancer Screening  09/12/2024   MAMMOGRAM  09/12/2024   DEXA SCAN  02/01/2025   Diabetic kidney evaluation - eGFR measurement  02/07/2025   Diabetic kidney evaluation - Urine ACR  02/07/2025   FOOT EXAM  02/07/2025   Colonoscopy  04/23/2031   Pneumonia Vaccine 93+ Years old  Completed   Hepatitis C Screening  Completed   HPV VACCINES  Aged Out    Health Maintenance  Health Maintenance Due  Topic Date Due   OPHTHALMOLOGY EXAM  Never done   DTaP/Tdap/Td (1 - Tdap) Never done   Zoster Vaccines- Shingrix (1 of 2) Never done   COVID-19 Vaccine (5 - 2024-25 season) 07/18/2023   Health Maintenance Items Addressed: Patient is aware she is due for a DM eye exam. She states she will call to schedule that when the weather is warmer  Additional Screening:  Vision Screening: Recommended annual ophthalmology exams for early detection of glaucoma and other disorders of the eye.  Dental Screening: Recommended annual dental exams for proper oral hygiene  Community Resource Referral / Chronic Care Management: CRR required this visit?  No   CCM required this visit?  No     Plan:     I have personally reviewed and noted the following in  the patient's chart:   Medical and social history Use of alcohol, tobacco or illicit drugs  Current medications and supplements including opioid prescriptions. Patient is not currently taking opioid prescriptions. Functional ability and status Nutritional status Physical activity Advanced directives List of other physicians Hospitalizations, surgeries, and ER visits in previous 12 months Vitals Screenings to include cognitive, depression, and falls Referrals and appointments  In addition, I have reviewed and discussed with patient certain preventive protocols, quality metrics, and best practice recommendations. A written personalized care plan for preventive services as well as general preventive health recommendations were provided to patient.     Jordan Hawks Jettson Crable, CMA   02/22/2024   After Visit Summary: (MyChart) Due to this being a telephonic visit, the after visit summary with patients personalized plan was offered to patient via MyChart   Notes: Nothing significant to report at this time.

## 2024-02-22 NOTE — Patient Instructions (Signed)
 Leah [name] , Thank you for taking time to come for your Medicare Wellness Visit. I appreciate your ongoing commitment to your health goals. Please review the following plan we discussed and let me know if I can assist you in the future.   Referrals/Orders/Follow-Ups/Clinician Recommendations:  Next Medicare Annual Wellness Visit:   February 26, 2025 at 8:40 am video visit.  Aim for 30 minutes of exercise or brisk walking, 6-8 glasses of water, and 5 servings of fruits and vegetables each day. You are due for the vaccines checked below. You may have these done at your preferred pharmacy. Please have them fax the office proof of the vaccines so that we can update your chart.   []  Flu (due annually)  Recommended this fall either at PCP office or through your local pharmacy. The flu season starts August 1 of each year.   [x]  Shingrix (Shingles vaccine): CDC recommends 2 doses of Shingrix separated by 2-6 months for aged 50 years and older:  []  Pneumonia Vaccines: Recommended for adults 65 years or older  [x]  TDAP (Tetanus) Vaccine every 10 years:Recommended every 10 years; Please call your insurance company to determine your out of pocket expense. You also receive this vaccine at your local pharmacy or Health Dept.  [x]  Covid-19: Available now at any [facility] pharmacy (see info below)  You may also get your vaccines at any [facility] (locations listed below.) Vaccine hours are Monday - Friday 9:00 - 4:00. No appointments are required. Most insurances are accepted including Medicaid. Anyone can use the community pharmacies, and people are not required to have a [facility] provider.  Community Pharmacy Locations offering vaccines:   Sport and exercise psychologist   [facility]  10 vaccines are offered at the [employer]: Covid, flu, Tdap, shingles, RSV, pneumonia, meningococcal,  hepatitis A, hepatitis B, and HPV.    This is a list of the screening recommended for you and due dates:  Health Maintenance  Topic Date Due   Eye exam for diabetics  Never done   DTaP/Tdap/Td vaccine (1 - Tdap) Never done   Zoster (Shingles) Vaccine (1 of 2) Never done   COVID-19 Vaccine (5 - 2024-25 season) 07/18/2023   Flu Shot  06/16/2024   Hemoglobin A1C  08/10/2024   Medicare Annual Wellness Visit  09/01/2024   Screening for Lung Cancer  09/12/2024   Mammogram  09/12/2024   DEXA scan (bone density measurement)  02/01/2025   Yearly kidney function blood test for diabetes  02/07/2025   Yearly kidney health urinalysis for diabetes  02/07/2025   Complete foot exam   02/07/2025   Colon Cancer Screening  04/22/2026   Pneumonia Vaccine  Completed   Hepatitis C Screening  Completed   HPV Vaccine  Aged Out    Advanced directives: (Provided) Advance directive discussed with you today. I have provided a copy for you to complete at home and have notarized. Once this is complete, please bring a copy in to our office so we can scan it into your chart.   Next Medicare Annual Wellness Visit scheduled for next year: yes  Understanding Your Risk for Falls Millions of people have serious injuries from falls each year. It is important to understand your risk of falling. Talk with your health care provider about your risk and what you can do to lower it. If you do have a serious fall,  make sure to tell your provider. Falling once raises your risk of falling again. How can falls affect me? Serious injuries from falls are common. These include: Broken bones, such as hip fractures. Head injuries, such as traumatic brain injuries (TBI) or concussions. A fear of falling can cause you to avoid activities and stay at home. This can make your muscles weaker and raise your risk for a fall. What can increase my risk? There are a number of risk factors that increase your risk for falling. The more risk  factors you have, the higher your risk of falling. Serious injuries from a fall happen most often to people who are older than 69 years old. Teenagers and young adults ages 58-29 are also at higher risk. Common risk factors include: Weakness in the lower body. Being generally weak or confused due to long-term (chronic) illness. Dizziness or balance problems. Poor vision. Medicines that cause dizziness or drowsiness. These may include: Medicines for your blood pressure, heart, anxiety, insomnia, or swelling (edema). Pain medicines. Muscle relaxants. Other risk factors include: Drinking alcohol. Having had a fall in the past. Having foot pain or wearing improper footwear. Working at a dangerous job. Having any of the following in your home: Tripping hazards, such as floor clutter or loose rugs. Poor lighting. Pets. Having dementia or memory loss. What actions can I take to lower my risk of falling?     Physical activity Stay physically fit. Do strength and balance exercises. Consider taking a regular class to build strength and balance. Yoga and tai chi are good options. Vision Have your eyes checked every year and your prescription for glasses or contacts updated as needed. Shoes and walking aids Wear non-skid shoes. Wear shoes that have rubber soles and low heels. Do not wear high heels. Do not walk around the house in socks or slippers. Use a cane or walker as told by your provider. Home safety Attach secure railings on both sides of your stairs. Install grab bars for your bathtub, shower, and toilet. Use a non-skid mat in your bathtub or shower. Attach bath mats securely with double-sided, non-slip rug tape. Use good lighting in all rooms. Keep a flashlight near your bed. Make sure there is a clear path from your bed to the bathroom. Use night-lights. Do not use throw rugs. Make sure all carpeting is taped or tacked down securely. Remove all clutter from walkways and  stairways, including extension cords. Repair uneven or broken steps and floors. Avoid walking on icy or slippery surfaces. Walk on the grass instead of on icy or slick sidewalks. Use ice melter to get rid of ice on walkways in the winter. Use a cordless phone. Questions to ask your health care provider Can you help me check my risk for a fall? Do any of my medicines make me more likely to fall? Should I take a vitamin D supplement? What exercises can I do to improve my strength and balance? Should I make an appointment to have my vision checked? Do I need a bone density test to check for weak bones (osteoporosis)? Would it help to use a cane or a walker? Where to find more information Centers for Disease Control and Prevention, STEADI: TonerPromos.no Community-Based Fall Prevention Programs: TonerPromos.no [employer] on Aging: BaseRingTones.pl Contact a health care provider if: You fall at home. You are afraid of falling at home. You feel weak, drowsy, or dizzy. This information is not intended to replace advice given to you by your health  care provider. Make sure you discuss any questions you have with your health care provider. Document Revised: 07/06/2022 Document Reviewed: 07/06/2022 Elsevier Patient Education  2024 [employer].

## 2024-02-23 ENCOUNTER — Encounter: Payer: Self-pay | Admitting: Cardiology

## 2024-02-23 ENCOUNTER — Ambulatory Visit: Payer: PPO | Attending: Cardiology | Admitting: Cardiology

## 2024-02-23 VITALS — BP 132/80 | HR 81 | Ht 63.0 in | Wt 172.6 lb

## 2024-02-23 DIAGNOSIS — I2510 Atherosclerotic heart disease of native coronary artery without angina pectoris: Secondary | ICD-10-CM | POA: Diagnosis not present

## 2024-02-23 DIAGNOSIS — E782 Mixed hyperlipidemia: Secondary | ICD-10-CM

## 2024-02-23 DIAGNOSIS — I10 Essential (primary) hypertension: Secondary | ICD-10-CM

## 2024-02-23 NOTE — Progress Notes (Signed)
 Clinical Summary Leah [name] is a 68 y.o.female seen today for follow up of the following medical problems.       1. HTN - she is compliant with meds   2. History of CAD - epic notes mention prior stenting about 25 years ago. From further review she had an MI in 1998, received stenting to her RCA     - denies any chest pains, no SOB/DOE - compliant with meds - EKG today SR, no ischemic changes.      3. 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 Past Medical History:  Diagnosis Date   Arthritis    Borderline diabetes    Breast lump on right side at 3 o'clock position    Benign, fatty mass   Diabetes mellitus (HCC) 02/08/2024   History of kidney stones    History of shingles    Hyperlipidemia    Hypertension    Myocardial infarction (HCC) 1996    HAD ANGIOPLASTY WITH 2 STENTS   Tinnitus    right ear    Vitamin D deficiency 10/17/2019   Wears glasses      Allergies  Allergen Reactions   Codeine     REACTION: " Makes me high and blacked out in the 1980s"     Current Outpatient Medications  Medication Sig Dispense Refill   amLODipine (NORVASC) 10 MG tablet Take 1 tablet (10 mg total) by mouth daily. 90 tablet 1   aspirin EC 81 MG tablet Take 81 mg by mouth every morning.      atorvastatin (LIPITOR) 80 MG tablet Take 1 tablet (80 mg total) by mouth daily. 90 tablet 3   Blood Pressure Monitoring (BLOOD PRESSURE MONITOR AUTOMAT) DEVI 1 Device by Does not apply route daily. Check blood pressure daily 1 each 0   cyanocobalamin (VITAMIN B12) 1000 MCG tablet Take 1,000 mcg by mouth daily.     ezetimibe (ZETIA) 10 MG tablet Take 1 tablet (10 mg total) by mouth daily. 90 tablet 3   hydrochlorothiazide  (HYDRODIURIL) 12.5 MG tablet Take 1 tablet (12.5 mg total) by mouth daily. 90 tablet 3   ibuprofen (ADVIL) 200 MG tablet Take 400 mg by mouth every 6 (six) hours as needed for headache or moderate pain.     metoprolol tartrate (LOPRESSOR) 25 MG tablet Take 1 tablet (25 mg total) by mouth daily. 180 tablet 1   Multiple Vitamin (MULTIVITAMIN WITH MINERALS) TABS tablet Take 1 tablet by mouth every morning.     olmesartan (BENICAR) 20 MG tablet Take 1 tablet (20 mg total) by mouth daily. 90 tablet 3   Omega 3 1000 MG CAPS Take 1,000 mg by mouth daily.     vitamin E 400 UNIT capsule Take 400 Units by mouth daily.     No current facility-administered medications for this visit.     Past Surgical History:  Procedure Laterality Date   BREAST LUMPECTOMY  >10 YRS AGO   RT BREAST   COLONOSCOPY WITH PROPOFOL N/A 04/22/2021   Procedure: COLONOSCOPY WITH PROPOFOL;  Surgeon: Lanelle Bal, DO;  [facility];  Service: Endoscopy;  Laterality: N/A;  11:45am  CORONARY ANGIOPLASTY  1996   ANGIOPLASTY / 2 STENTS   CYSTOSCOPY WITH STENT PLACEMENT Right 12/30/2015   Procedure: CYSTOSCOPY;  Surgeon: Malen Gauze, MD;  [facility]: [facility];  Service: Urology;  Laterality: Right;   CYSTOSCOPY WITH STENT PLACEMENT Right 01/09/2016   Procedure: CYSTOSCOPY WITH STENT PLACEMENT;  Surgeon: Malen Gauze, MD;  [facility];  Service: Urology;  Laterality: Right;   HOLMIUM LASER APPLICATION Right 01/09/2016   Procedure: HOLMIUM LASER APPLICATION;  Surgeon: Malen Gauze, MD;  [facility]: [facility];  Service: Urology;  Laterality: Right;   LITHOTRIPSY     NEPHROLITHOTOMY Right 12/30/2015   Procedure: RIght Nephrostomy with access;  Surgeon: Malen Gauze, MD;  [facility];  Service: Urology;  Laterality: Right;   NEPHROLITHOTOMY Right 01/09/2016   Procedure: RIGHT PERCUTANEOUS NEPHROLITHOTOMY SECOND LOOK , POSSIBLE DILATION OF TRACT;  Surgeon: Malen Gauze, MD;  [facility];   Service: Urology;  Laterality: Right;   POLYPECTOMY  04/22/2021   Procedure: POLYPECTOMY;  Surgeon: Lanelle Bal, DO;  [facility];  Service: Endoscopy;;     Allergies  Allergen Reactions   Codeine     REACTION: " Makes me high and blacked out in the 1980s"      Family History  Problem Relation Age of Onset   Heart disease Mother    Diabetes Mother    Cancer Father        bone   Cirrhosis Father    Stroke Maternal Grandmother    Stroke Maternal Grandfather    Heart disease Sister    Obesity Brother    Healthy Daughter      Social History Leah [name] reports that she has been smoking cigarettes. She has a 40 pack-year smoking history. She has never used smokeless tobacco. Leah [name] reports no history of alcohol use.    Physical Examination Today's Vitals   02/23/24 0843  BP: 132/80  Pulse: 81  SpO2: 99%  Weight: 172 lb 9.6 oz (78.3 kg)  Height: 5\' 3"  (1.6 m)  PainSc: 0-No pain   Body mass index is 30.57 kg/m.  Gen: resting comfortably, no acute distress HEENT: no scleral icterus, pupils equal round and reactive, no palptable cervical adenopathy,  CV: RRR, no m/rg, no jvd Resp: Clear to auscultation bilaterally GI: abdomen is soft, non-tender, non-distended, normal bowel sounds, no hepatosplenomegaly MSK: extremities are warm, no edema.  Skin: warm, no rash Neuro:  no focal deficits Psych: appropriate affect     Assessment and Plan   1. History of CAD - remote MI as reported above - no recent symptoms - EKG today SR, no ischemic changes - continue current meds   2. HTN -bp at goal, continue current meds   3. Hyperlipidemia - repeat lipid panel. Goal LDL would be <70, would change atorvastatin to crestor if needed   F/u 1 year     Antoine Poche, M.D.

## 2024-02-23 NOTE — Patient Instructions (Signed)
 Medication Instructions:  Your physician recommends that you continue on your current medications as directed. Please refer to the Current Medication list given to you today.  *If you need a refill on your cardiac medications before your next appointment, please call your pharmacy*  Lab Work: Fasting Lipid Panel   If you have labs (blood work) drawn today and your tests are completely normal, you will receive your results only by: MyChart Message (if you have MyChart) OR A paper copy in the mail If you have any lab test that is abnormal or we need to change your treatment, we will call you to review the results.  Testing/Procedures: None  Follow-Up: At [facility], you and your health needs are our priority.  As part of our continuing mission to provide you with exceptional heart care, our providers are all part of one team.  This team includes your primary Cardiologist (physician) and Advanced Practice Providers or APPs (Physician Assistants and Nurse Practitioners) who all work together to provide you with the care you need, when you need it.  Your next appointment:   1 year(s)  Provider:   You may see Dina Rich, MD or one of the following Advanced Practice Providers on your designated Care Team:   Randall An, PA-C  [place], [place] Jacolyn Reedy, [place]     We recommend signing up for the patient portal called "MyChart".  Sign up information is provided on this After Visit Summary.  MyChart is used to connect with patients for Virtual Visits (Telemedicine).  Patients are able to view lab/test results, encounter notes, upcoming appointments, etc.  Non-urgent messages can be sent to your provider as well.   To learn more about what you can do with MyChart, go to [email or web address].   Other Instructions

## 2024-02-24 LAB — LIPID PANEL
Cholesterol: 140 mg/dL (ref <200)
HDL: 50 mg/dL (ref >=50)
LDL Cholesterol (Calc): 72 mg/dL
Non-HDL Cholesterol (Calc): 90 mg/dL (ref <130)
Total CHOL/HDL Ratio: 2.8 (calc) (ref <5.0)
Triglycerides: 93 mg/dL (ref <150)

## 2024-06-22 ENCOUNTER — Ambulatory Visit: Admitting: Internal Medicine

## 2024-06-27 ENCOUNTER — Ambulatory Visit: Payer: Self-pay

## 2024-06-27 NOTE — Telephone Encounter (Addendum)
  Third attempt; no answer; left vm Second attempt; no answer; left vm First attempt; no answer    Copied from CRM [id number]. Topic: Clinical - Medical Advice >> Jun 27, 2024 11:55 AM Tiffany B wrote: Reason for CRM: Patient requesting to speak with PCP nurse. Patient experiencing congestion,  coughing and weakness for 4 to 5 days. Patient states OTC medication is not working.

## 2024-06-27 NOTE — Telephone Encounter (Signed)
 Copied from CRM [id number]. Topic: General - Other >> Jun 27, 2024  1:51 PM Jasmin G wrote: Reason for CRM: Pt called regarding recent missed phone call, please refer to Encounter with [facility] on 8/12, pt was unsure if clinic or Mrs. Candace, Reed from [facility] called her back, please give her a call at [phone] if needed, I tried to reach CAL but did not answer the phone.

## 2024-07-04 ENCOUNTER — Encounter: Payer: Self-pay | Admitting: Internal Medicine

## 2024-07-04 ENCOUNTER — Ambulatory Visit (INDEPENDENT_AMBULATORY_CARE_PROVIDER_SITE_OTHER): Admitting: Internal Medicine

## 2024-07-04 VITALS — BP 138/78 | HR 112 | Ht 63.0 in | Wt 163.8 lb

## 2024-07-04 DIAGNOSIS — E1169 Type 2 diabetes mellitus with other specified complication: Secondary | ICD-10-CM | POA: Diagnosis not present

## 2024-07-04 DIAGNOSIS — I10 Essential (primary) hypertension: Secondary | ICD-10-CM

## 2024-07-04 DIAGNOSIS — J439 Emphysema, unspecified: Secondary | ICD-10-CM

## 2024-07-04 DIAGNOSIS — E782 Mixed hyperlipidemia: Secondary | ICD-10-CM | POA: Diagnosis not present

## 2024-07-04 DIAGNOSIS — I2510 Atherosclerotic heart disease of native coronary artery without angina pectoris: Secondary | ICD-10-CM | POA: Diagnosis not present

## 2024-07-04 DIAGNOSIS — N1831 Chronic kidney disease, stage 3a: Secondary | ICD-10-CM | POA: Diagnosis not present

## 2024-07-04 NOTE — Patient Instructions (Signed)
 Please continue to take medications as prescribed.  Please continue to follow low carb diet and perform moderate exercise/walking at least 150 mins/week.

## 2024-07-04 NOTE — Progress Notes (Signed)
 Established Patient Office Visit  Subjective:  Patient ID: Leah [name], female    DOB: 04/25/1956  Age: 68 y.o. MRN: [id number]  CC:  Chief Complaint  Patient presents with   Diabetes Mellitus    4 month f/u    Chronic Kidney Disease    4 month f/u     HPI Leah [name] is a 68 y.o. female with past medical history of CAD s/p stent placement, HTN, DM, CKD, emphysema, HLD, OA and tobacco abuse who presents for f/u of her chronic medical conditions.  CAD: She has had stent placed to RCA in 1998.  She denies any anginal pain, dyspnea or palpitations currently.  She is on aspirin , statin, Zetia  and metoprolol  currently.  She follows up with cardiology. BP is well-controlled. Takes medications regularly. Patient denies headache, dizziness, chest pain, dyspnea or palpitations.  Type 2 DM: Her last HbA1c was 6.2 in 03/25. Diet controlled currently. She has cut down soft drink intake.  Denies polyuria or polyphagia currently.  CKD: Her last CMP showed stable GFR at 58, but states that she has been taking only 24 ounces of fluid in a day now. Her ALP has been borderline elevated.  She denies any dysuria, hematuria or urinary retention or resistance.  She has history of microalbuminuria as well.  She takes vitamin D , vitamin B12 supplements. She has constipation from Vitamin D  supplement.   Past Medical History:  Diagnosis Date   Arthritis    Borderline diabetes    Breast lump on right side at 3 o'clock position    Benign, fatty mass   Diabetes mellitus (HCC) 02/08/2024   History of kidney stones    History of shingles    Hyperlipidemia    Hypertension    Myocardial infarction (HCC) 1996    HAD ANGIOPLASTY WITH 2 STENTS   Tinnitus    right ear    Vitamin D  deficiency 10/17/2019   Wears glasses     Past Surgical History:  Procedure Laterality Date   BREAST LUMPECTOMY  >10 YRS AGO   RT BREAST   COLONOSCOPY WITH PROPOFOL  N/A 04/22/2021   Procedure: COLONOSCOPY WITH PROPOFOL ;   Surgeon: Cindie Carlin POUR, DO;  [facility];  Service: Endoscopy;  Laterality: N/A;  11:45am   CORONARY ANGIOPLASTY  1996   ANGIOPLASTY / 2 STENTS   CYSTOSCOPY WITH STENT PLACEMENT Right 12/30/2015   Procedure: CYSTOSCOPY;  Surgeon: Belvie LITTIE Clara, MD;  [facility];  Service: Urology;  Laterality: Right;   CYSTOSCOPY WITH STENT PLACEMENT Right 01/09/2016   Procedure: CYSTOSCOPY WITH STENT PLACEMENT;  Surgeon: Belvie LITTIE Clara, MD;  [facility];  Service: Urology;  Laterality: Right;   HOLMIUM LASER APPLICATION Right 01/09/2016   Procedure: HOLMIUM LASER APPLICATION;  Surgeon: Belvie LITTIE Clara, MD;  [facility];  Service: Urology;  Laterality: Right;   LITHOTRIPSY     NEPHROLITHOTOMY Right 12/30/2015   Procedure: RIght Nephrostomy with access;  Surgeon: Belvie LITTIE Clara, MD;  [facility];  Service: Urology;  Laterality: Right;   NEPHROLITHOTOMY Right 01/09/2016   Procedure: RIGHT PERCUTANEOUS NEPHROLITHOTOMY SECOND LOOK , POSSIBLE DILATION OF TRACT;  Surgeon: Belvie LITTIE Clara, MD;  [facility];  Service: Urology;  Laterality: Right;   POLYPECTOMY  04/22/2021   Procedure: POLYPECTOMY;  Surgeon: Cindie Carlin POUR, DO;  [facility];  Service: Endoscopy;;    Family History  Problem Relation Age of Onset   Heart disease Mother  Diabetes Mother    Cancer Father        bone   Cirrhosis Father    Stroke Maternal Grandmother    Stroke Maternal Grandfather    Heart disease Sister    Obesity Brother    Healthy Daughter     Social History   Socioeconomic History   Marital status: Widowed    Spouse name: Not on file   Number of children: 1   Years of education: 80   Highest education level: Master's degree (e.g., MA, MS, MEng, MEd, MSW, MBA)  Occupational History   Occupation: Retired  Tobacco Use   Smoking status: Every Day    Current packs/day: 1.00    Average packs/day: 1 pack/day for 40.0 years (40.0 ttl pk-yrs)    Types:  Cigarettes   Smokeless tobacco: Never  Vaping Use   Vaping status: Never Used  Substance and Sexual Activity   Alcohol use: No    Alcohol/week: 0.0 standard drinks of alcohol   Drug use: No   Sexual activity: Yes  Other Topics Concern   Not on file  Social History Narrative   Single,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   Social Drivers of Corporate investment banker Strain: Low Risk  (11/25/2021)   Overall Financial Resource Strain (CARDIA)    Difficulty of Paying Living Expenses: Not hard at all  Food Insecurity: No Food Insecurity (06/19/2022)   Hunger Vital Sign    Worried About Running Out of Food in the Last Year: Never true    Ran Out of Food in the Last Year: Never true  Transportation Needs: No Transportation Needs (06/10/2023)   PRAPARE - Administrator, Civil Service (Medical): No    Lack of Transportation (Non-Medical): No  Physical Activity: Insufficiently Active (02/22/2024)   Exercise Vital Sign    Days of Exercise per Week: 7 days    Minutes of Exercise per Session: 20 min  Stress: No Stress Concern Present (11/25/2021)   [employer] of Occupational Health - Occupational Stress Questionnaire    Feeling of Stress : Not at all  Social Connections: Socially Isolated (11/25/2021)   Social Connection and Isolation Panel    Frequency of Communication with Friends and Family: More than three times a week    Frequency of Social Gatherings with Friends and Family: Once a week    Attends Religious Services: Never    Database administrator or Organizations: No    Attends Banker Meetings: Never    Marital Status: Widowed  Intimate Partner Violence: Not At Risk (11/25/2021)   Humiliation, Afraid, Rape, and Kick questionnaire    Fear of Current or Ex-Partner: No    Emotionally Abused: No    Physically Abused: No    Sexually Abused: No    Outpatient Medications Prior to Visit  Medication Sig Dispense Refill   amLODipine  (NORVASC ) 10 MG tablet Take  1 tablet (10 mg total) by mouth daily. 90 tablet 1   aspirin  EC 81 MG tablet Take 81 mg by mouth every morning.      atorvastatin  (LIPITOR ) 80 MG tablet Take 1 tablet (80 mg total) by mouth daily. 90 tablet 3   Blood Pressure Monitoring (BLOOD PRESSURE MONITOR AUTOMAT) DEVI 1 Device by Does not apply route daily. Check blood pressure daily 1 each 0   cyanocobalamin (VITAMIN B12) 1000 MCG tablet Take 1,000 mcg by mouth daily.     ezetimibe  (ZETIA ) 10 MG tablet Take 1 tablet (10 mg total) by  mouth daily. 90 tablet 3   hydrochlorothiazide  (HYDRODIURIL ) 12.5 MG tablet Take 1 tablet (12.5 mg total) by mouth daily. 90 tablet 3   ibuprofen (ADVIL) 200 MG tablet Take 400 mg by mouth every 6 (six) hours as needed for headache or moderate pain.     metoprolol  tartrate (LOPRESSOR ) 25 MG tablet Take 1 tablet (25 mg total) by mouth daily. 180 tablet 1   Multiple Vitamin (MULTIVITAMIN WITH MINERALS) TABS tablet Take 1 tablet by mouth every morning.     olmesartan  (BENICAR ) 20 MG tablet Take 1 tablet (20 mg total) by mouth daily. 90 tablet 3   Omega 3 1000 MG CAPS Take 1,000 mg by mouth daily.     vitamin E 400 UNIT capsule Take 400 Units by mouth daily.     No facility-administered medications prior to visit.    Allergies  Allergen Reactions   Codeine     REACTION:  Makes me high and blacked out in the 1980s    ROS Review of Systems  Constitutional:  Negative for chills and fever.  HENT:  Positive for tinnitus. Negative for congestion, sinus pressure, sinus pain and sore throat.        Hearing problem  Eyes:  Negative for pain and discharge.  Respiratory:  Negative for cough and shortness of breath.   Cardiovascular:  Negative for chest pain and palpitations.  Gastrointestinal:  Negative for abdominal pain, diarrhea, nausea and vomiting.  Endocrine: Negative for polydipsia and polyuria.  Genitourinary:  Negative for dysuria and hematuria.  Musculoskeletal:  Negative for neck pain and neck  stiffness.  Skin:  Negative for rash.  Neurological:  Negative for dizziness and weakness.  Psychiatric/Behavioral:  Negative for agitation and behavioral problems.       Objective:    Physical Exam Vitals reviewed.  Constitutional:      General: She is not in acute distress.    Appearance: She is not diaphoretic.  HENT:     Head: Normocephalic and atraumatic.     Nose: Nose normal.     Mouth/Throat:     Mouth: Mucous membranes are moist.  Eyes:     General: No scleral icterus.    Extraocular Movements: Extraocular movements intact.  Cardiovascular:     Rate and Rhythm: Normal rate and regular rhythm.     Heart sounds: Normal heart sounds. No murmur heard. Pulmonary:     Breath sounds: Normal breath sounds. No wheezing or rales.  Musculoskeletal:     Cervical back: Neck supple. No tenderness.     Right lower leg: No edema.     Left lower leg: No edema.  Skin:    General: Skin is warm.     Findings: No rash.  Neurological:     General: No focal deficit present.     Mental Status: She is alert and oriented to person, place, and time.     Sensory: No sensory deficit.     Motor: No weakness.  Psychiatric:        Mood and Affect: Mood normal.        Behavior: Behavior normal.     BP 138/78 (BP Location: Right Arm)   Pulse (!) 112   Ht 5' 3 (1.6 m)   Wt 163 lb 12.8 oz (74.3 kg)   LMP 05/16/2002   SpO2 96%   BMI 29.02 kg/m  Wt Readings from Last 3 Encounters:  07/04/24 163 lb 12.8 oz (74.3 kg)  02/23/24 172 lb 9.6 oz (78.3  kg)  02/22/24 171 lb (77.6 kg)    Lab Results  Component Value Date   TSH 2.090 08/11/2023   Lab Results  Component Value Date   WBC 6.4 08/11/2023   HGB 15.1 08/11/2023   HCT 45.9 08/11/2023   MCV 95 08/11/2023   PLT 211 08/11/2023   Lab Results  Component Value Date   NA 140 02/08/2024   K 4.2 02/08/2024   CO2 26 02/08/2024   GLUCOSE 102 (H) 02/08/2024   BUN 9 02/08/2024   CREATININE 1.04 (H) 02/08/2024   BILITOT 0.4  02/08/2024   ALKPHOS 164 (H) 02/08/2024   AST 20 02/08/2024   ALT 20 02/08/2024   PROT 6.8 02/08/2024   ALBUMIN 4.1 02/08/2024   CALCIUM  9.5 02/08/2024   ANIONGAP 7 12/12/2019   EGFR 58 (L) 02/08/2024   Lab Results  Component Value Date   CHOL 140 02/23/2024   Lab Results  Component Value Date   HDL 50 02/23/2024   Lab Results  Component Value Date   LDLCALC 72 02/23/2024   Lab Results  Component Value Date   TRIG 93 02/23/2024   Lab Results  Component Value Date   CHOLHDL 2.8 02/23/2024   Lab Results  Component Value Date   HGBA1C 6.2 (H) 02/08/2024      Assessment & Plan:   Problem List Items Addressed This Visit       Cardiovascular and Mediastinum   Essential hypertension - Primary   BP Readings from Last 1 Encounters:  07/04/24 138/78   Well-controlled with amlodipine , HCTZ and Olmesartan  Counseled for compliance with the medications Advised DASH diet and moderate exercise/walking, at least 150 mins/week      Coronary artery disease involving native heart without angina pectoris   S/p stent placement, followed by cardiology On aspirin  and statin On Lopressor  25 mg QD No anginal symptoms currently        Respiratory   Pulmonary emphysema (HCC)   Noted on CT chest Asymptomatic currently Advised to cut down -> quit smoking        Endocrine   Type 2 diabetes mellitus with other specified complication (HCC)   Lab Results  Component Value Date   HGBA1C 6.2 (H) 02/08/2024   Well-controlled with diet alone Associated with HTN, CAD, HLD and CKD She would be an excellent candidate for GLP-1 agonist therapy considering her history of CAD and CKD - check HbA1c, she agrees to start Ozempic only if HbA1c is elevated > 7 Advised to follow diabetic diet On ARB and statin F/u CMP and lipid panel Diabetic eye exam: Advised to follow up with Ophthalmology for diabetic eye exam      Relevant Orders   CMP14+EGFR   Hemoglobin A1c     Genitourinary    Stage 3a chronic kidney disease (HCC)   Last 2 CMP showed GFR ~55-60, but has h/o poor PO fluid intake Avoid nephrotoxic agents Needs to improve fluid intake On Olmesartan  and HCTZ Checked urine microalbumin/creatinine ratio - has mild proteinuria, if persistent, plan to add [place]      Relevant Orders   CMP14+EGFR   Parathyroid  hormone, intact (no Ca)   CBC     Other   Hyperlipidemia   On statin, Zetia  and omega-3 Checked lipid profile          No orders of the defined types were placed in this encounter.   Follow-up: Return in about 4 months (around 11/03/2024).    Suzzane MARLA Blanch, MD

## 2024-07-04 NOTE — Assessment & Plan Note (Signed)
 BP Readings from Last 1 Encounters:  07/04/24 138/78   Well-controlled with amlodipine , HCTZ and Olmesartan  Counseled for compliance with the medications Advised DASH diet and moderate exercise/walking, at least 150 mins/week

## 2024-07-04 NOTE — Assessment & Plan Note (Signed)
 Last 2 CMP showed GFR ~55-60, but has h/o poor PO fluid intake Avoid nephrotoxic agents Needs to improve fluid intake On Olmesartan  and HCTZ Checked urine microalbumin/creatinine ratio - has mild proteinuria, if persistent, plan to add Farxiga

## 2024-07-04 NOTE — Assessment & Plan Note (Signed)
On statin, Zetia and omega-3 Checked lipid profile

## 2024-07-04 NOTE — Assessment & Plan Note (Signed)
 Lab Results  Component Value Date   HGBA1C 6.2 (H) 02/08/2024   Well-controlled with diet alone Associated with HTN, CAD, HLD and CKD She would be an excellent candidate for GLP-1 agonist therapy considering her history of CAD and CKD - check HbA1c, she agrees to start Ozempic only if HbA1c is elevated > 7 Advised to follow diabetic diet On ARB and statin F/u CMP and lipid panel Diabetic eye exam: Advised to follow up with Ophthalmology for diabetic eye exam

## 2024-07-04 NOTE — Assessment & Plan Note (Signed)
Noted on CT chest ?Asymptomatic currently ?Advised to cut down -> quit smoking ?

## 2024-07-04 NOTE — Assessment & Plan Note (Signed)
 S/p stent placement, followed by cardiology On aspirin and statin On Lopressor 25 mg QD No anginal symptoms currently

## 2024-07-06 ENCOUNTER — Ambulatory Visit: Payer: Self-pay | Admitting: Internal Medicine

## 2024-07-06 LAB — CBC
Hematocrit: 43.7 % (ref 34.0–46.6)
Hemoglobin: 14.5 g/dL (ref 11.1–15.9)
MCH: 30.7 pg (ref 26.6–33.0)
MCHC: 33.2 g/dL (ref 31.5–35.7)
MCV: 92 fL (ref 79–97)
Platelets: 358 x10E3/uL (ref 150–450)
RBC: 4.73 x10E6/uL (ref 3.77–5.28)
RDW: 12.8 % (ref 11.7–15.4)
WBC: 6 x10E3/uL (ref 3.4–10.8)

## 2024-07-06 LAB — CMP14+EGFR
ALT: 15 IU/L (ref 0–32)
AST: 22 IU/L (ref 0–40)
Albumin: 3.6 g/dL — ABNORMAL LOW (ref 3.9–4.9)
Alkaline Phosphatase: 121 IU/L (ref 44–121)
BUN/Creatinine Ratio: 5 — ABNORMAL LOW (ref 12–28)
BUN: 5 mg/dL — ABNORMAL LOW (ref 8–27)
Bilirubin Total: 0.3 mg/dL (ref 0.0–1.2)
CO2: 25 mmol/L (ref 20–29)
Calcium: 9.2 mg/dL (ref 8.7–10.3)
Chloride: 98 mmol/L (ref 96–106)
Creatinine, Ser: 0.93 mg/dL (ref 0.57–1.00)
Globulin, Total: 3.3 g/dL (ref 1.5–4.5)
Glucose: 114 mg/dL — ABNORMAL HIGH (ref 70–99)
Potassium: 3.3 mmol/L — ABNORMAL LOW (ref 3.5–5.2)
Sodium: 138 mmol/L (ref 134–144)
Total Protein: 6.9 g/dL (ref 6.0–8.5)
eGFR: 67 mL/min/1.73 (ref >59)

## 2024-07-06 LAB — HEMOGLOBIN A1C
Est. average glucose Bld gHb Est-mCnc: 131 mg/dL
Hgb A1c MFr Bld: 6.2 % — ABNORMAL HIGH (ref 4.8–5.6)

## 2024-07-06 LAB — PARATHYROID HORMONE, INTACT (NO CA): PTH: 22 pg/mL (ref 15–65)

## 2024-08-02 NOTE — Progress Notes (Signed)
   08/02/2024  Patient ID: Leah [name], female   DOB: 1956/10/11, 68 y.o.   MRN: [id number]  Pharmacy Quality Measure Review  This patient is appearing on a report for being at risk of failing the adherence measure for cholesterol (statin) medications this calendar year.   Medication: atorvastatin  Last fill date: 07/25/24 for 90 day supply  Insurance report was not up to date. No action needed at this time.   Lang Sieve, PharmD, BCGP Clinical Pharmacist  [phone]

## 2024-09-15 ENCOUNTER — Other Ambulatory Visit: Payer: Self-pay | Admitting: Internal Medicine

## 2024-09-15 DIAGNOSIS — E782 Mixed hyperlipidemia: Secondary | ICD-10-CM

## 2024-10-13 ENCOUNTER — Other Ambulatory Visit: Payer: Self-pay | Admitting: Internal Medicine

## 2024-10-13 DIAGNOSIS — I10 Essential (primary) hypertension: Secondary | ICD-10-CM

## 2024-10-13 DIAGNOSIS — I2510 Atherosclerotic heart disease of native coronary artery without angina pectoris: Secondary | ICD-10-CM

## 2024-10-15 ENCOUNTER — Other Ambulatory Visit: Payer: Self-pay | Admitting: Internal Medicine

## 2024-10-15 DIAGNOSIS — I10 Essential (primary) hypertension: Secondary | ICD-10-CM

## 2024-10-26 ENCOUNTER — Other Ambulatory Visit: Payer: Self-pay | Admitting: Internal Medicine

## 2024-10-26 DIAGNOSIS — I10 Essential (primary) hypertension: Secondary | ICD-10-CM

## 2024-11-06 NOTE — Progress Notes (Signed)
 Pharmacy Quality Measure Review  This patient is appearing on a report for being at risk of failing the adherence measure for cholesterol (statin) medications this calendar year.   Medication: Atorvastatin  80 mg Last fill date: 10/16/24 for 90 day supply, sold on 10/16/24  Insurance report was not up to date. No action needed at this time.   Jenkins Graces, PharmD PGY1 Pharmacy Resident

## 2024-11-07 ENCOUNTER — Ambulatory Visit: Admitting: Internal Medicine

## 2024-11-07 ENCOUNTER — Encounter: Payer: Self-pay | Admitting: Internal Medicine

## 2024-11-07 VITALS — BP 128/68 | HR 90 | Ht 63.0 in | Wt 151.0 lb

## 2024-11-07 DIAGNOSIS — I2510 Atherosclerotic heart disease of native coronary artery without angina pectoris: Secondary | ICD-10-CM

## 2024-11-07 DIAGNOSIS — J439 Emphysema, unspecified: Secondary | ICD-10-CM

## 2024-11-07 DIAGNOSIS — E782 Mixed hyperlipidemia: Secondary | ICD-10-CM

## 2024-11-07 DIAGNOSIS — J0110 Acute frontal sinusitis, unspecified: Secondary | ICD-10-CM

## 2024-11-07 DIAGNOSIS — E1169 Type 2 diabetes mellitus with other specified complication: Secondary | ICD-10-CM | POA: Diagnosis not present

## 2024-11-07 DIAGNOSIS — N1831 Chronic kidney disease, stage 3a: Secondary | ICD-10-CM

## 2024-11-07 DIAGNOSIS — I10 Essential (primary) hypertension: Secondary | ICD-10-CM | POA: Diagnosis not present

## 2024-11-07 MED ORDER — AMOXICILLIN-POT CLAVULANATE 875-125 MG PO TABS: 1.0000 | ORAL_TABLET | Freq: Two times a day (BID) | ORAL | 0 refills | Status: AC

## 2024-11-07 NOTE — Assessment & Plan Note (Signed)
 Lab Results  Component Value Date   HGBA1C 6.2 (H) 07/04/2024   Well-controlled with diet alone Associated with HTN, CAD, HLD and CKD She would be an excellent candidate for GLP-1 agonist therapy considering her history of CAD and CKD - check HbA1c, she agrees to start Ozempic only if HbA1c is elevated > 7 Advised to follow diabetic diet On ARB and statin F/u CMP and lipid panel Diabetic eye exam: Advised to follow up with Ophthalmology for diabetic eye exam

## 2024-11-07 NOTE — Assessment & Plan Note (Signed)
 On statin, Zetia  and omega-3 Checked lipid profile - LDL 72 Needs to continue to follow low carb diet

## 2024-11-07 NOTE — Patient Instructions (Addendum)
 Please start taking Augmentin  as prescribed for sinusitis.  Please continue to take medications as prescribed.  Please continue to follow low carb diet and perform moderate exercise/walking as tolerated.

## 2024-11-07 NOTE — Assessment & Plan Note (Signed)
 S/p stent placement, followed by cardiology On aspirin and statin On Lopressor 25 mg QD No anginal symptoms currently

## 2024-11-07 NOTE — Assessment & Plan Note (Signed)
Noted on CT chest ?Asymptomatic currently ?Advised to cut down -> quit smoking ?

## 2024-11-07 NOTE — Progress Notes (Signed)
 "  Established Patient Office Visit  Subjective:  Patient ID: Leah [name], female    DOB: [birth year]  Age: 68 y.o. MRN: [id number]  CC:  Chief Complaint  Patient presents with   Nasal Congestion   Hypertension   Hyperlipidemia   Diabetes    HPI Leah [name] is a 68 y.o. female with past medical history of CAD s/p stent placement, HTN, DM, CKD, emphysema, HLD, OA and tobacco abuse who presents for f/u of her chronic medical conditions.  CAD: She has had stent placed to RCA in 1998.  She denies any anginal pain, dyspnea or palpitations currently.  She is on aspirin , statin, Zetia  and metoprolol  currently.  She follows up with cardiology. BP is well-controlled. Takes medications regularly. Patient denies headache, dizziness, chest pain, dyspnea or palpitations.  Type 2 DM: Her last HbA1c was 6.2 in 08/25. Diet controlled currently. She has cut down soft drink intake. Denies polyuria or polyphagia currently. She has lost about 12 lbs since the last visit with diet modification.  CKD: Her last CMP showed stable GFR at 58, but states that she has been taking only 24 ounces of fluid in a day now. Her ALP has been borderline elevated.  She denies any dysuria, hematuria or urinary retention or resistance.  She has history of microalbuminuria as well.  She takes vitamin D , vitamin B12 supplements.  She reports nasal congestion, postnasal drip and sinus pressure related headache for the last 1 week.  She has tried taking OTC antihistaminic without much relief.  She also reports mild dry cough and sore throat.  Denies any fever, chills. Denies dyspnea or wheezing.  Past Medical History:  Diagnosis Date   Arthritis    Borderline diabetes    Breast lump on right side at 3 o'clock position    Benign, fatty mass   Diabetes mellitus (HCC) 02/08/2024   History of kidney stones    History of shingles    Hyperlipidemia    Hypertension    Myocardial infarction (HCC) 1996    HAD ANGIOPLASTY WITH 2  STENTS   Tinnitus    right ear    Vitamin D  deficiency 10/17/2019   Wears glasses     Past Surgical History:  Procedure Laterality Date   BREAST LUMPECTOMY  >10 YRS AGO   RT BREAST   COLONOSCOPY WITH PROPOFOL  N/A 04/22/2021   Procedure: COLONOSCOPY WITH PROPOFOL ;  Surgeon: Cindie Carlin POUR, DO;  Location: [facility];  Service: Endoscopy;  Laterality: N/A;  11:45am   CORONARY ANGIOPLASTY  1996   ANGIOPLASTY / 2 STENTS   CYSTOSCOPY WITH STENT PLACEMENT Right 12/30/2015   Procedure: CYSTOSCOPY;  Surgeon: Belvie LITTIE Clara, MD;  Location: [facility];  Service: Urology;  Laterality: Right;   CYSTOSCOPY WITH STENT PLACEMENT Right 01/09/2016   Procedure: CYSTOSCOPY WITH STENT PLACEMENT;  Surgeon: Belvie LITTIE Clara, MD;  Location: [facility];  Service: Urology;  Laterality: Right;   HOLMIUM LASER APPLICATION Right 01/09/2016   Procedure: HOLMIUM LASER APPLICATION;  Surgeon: Belvie LITTIE Clara, MD;  Location: [facility];  Service: Urology;  Laterality: Right;   LITHOTRIPSY     NEPHROLITHOTOMY Right 12/30/2015   Procedure: RIght Nephrostomy with access;  Surgeon: Belvie LITTIE Clara, MD;  Location: [facility];  Service: Urology;  Laterality: Right;   NEPHROLITHOTOMY Right 01/09/2016   Procedure: RIGHT PERCUTANEOUS NEPHROLITHOTOMY SECOND LOOK , POSSIBLE DILATION OF TRACT;  Surgeon: Belvie LITTIE Clara, MD;  Location: [facility];  Service: Urology;  Laterality: Right;  POLYPECTOMY  04/22/2021   Procedure: POLYPECTOMY;  Surgeon: Cindie Carlin POUR, DO;  Location: [facility];  Service: Endoscopy;;    Family History  Problem Relation Age of Onset   Heart disease Mother    Diabetes Mother    Cancer Father        bone   Cirrhosis Father    Stroke Maternal Grandmother    Stroke Maternal Grandfather    Heart disease Sister    Obesity Brother    Healthy Daughter     Social History   Socioeconomic History   Marital status: Widowed    Spouse name: Not on file   Number of children: 1   Years of education: 2    Highest education level: Master's degree (e.g., MA, MS, MEng, MEd, MSW, MBA)  Occupational History   Occupation: Retired  Tobacco Use   Smoking status: Every Day    Current packs/day: 1.00    Average packs/day: 1 pack/day for 40.0 years (40.0 ttl pk-yrs)    Types: Cigarettes   Smokeless tobacco: Never  Vaping Use   Vaping status: Never Used  Substance and Sexual Activity   Alcohol use: No    Alcohol/week: 0.0 standard drinks of alcohol   Drug use: No   Sexual activity: Yes  Other Topics Concern   Not on file  Social History Narrative   Single,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   Social Drivers of Health   Tobacco Use: High Risk (11/07/2024)   Patient History    Smoking Tobacco Use: Every Day    Smokeless Tobacco Use: Never    Passive Exposure: Not on file  Financial Resource Strain: Low Risk (11/25/2021)   Overall Financial Resource Strain (CARDIA)    Difficulty of Paying Living Expenses: Not hard at all  Food Insecurity: No Food Insecurity (06/19/2022)   Hunger Vital Sign    Worried About Running Out of Food in the Last Year: Never true    Ran Out of Food in the Last Year: Never true  Transportation Needs: No Transportation Needs (06/10/2023)   PRAPARE - Administrator, Civil Service (Medical): No    Lack of Transportation (Non-Medical): No  Physical Activity: Insufficiently Active (02/22/2024)   Exercise Vital Sign    Days of Exercise per Week: 7 days    Minutes of Exercise per Session: 20 min  Stress: No Stress Concern Present (11/25/2021)   [employer] of Occupational Health - Occupational Stress Questionnaire    Feeling of Stress : Not at all  Social Connections: Socially Isolated (11/25/2021)   Social Connection and Isolation Panel    Frequency of Communication with Friends and Family: More than three times a week    Frequency of Social Gatherings with Friends and Family: Once a week    Attends Religious Services: Never    Database Administrator or  Organizations: No    Attends Banker Meetings: Never    Marital Status: Widowed  Intimate Partner Violence: Not At Risk (11/25/2021)   Humiliation, Afraid, Rape, and Kick questionnaire    Fear of Current or Ex-Partner: No    Emotionally Abused: No    Physically Abused: No    Sexually Abused: No  Depression (PHQ2-9): Low Risk (11/07/2024)   Depression (PHQ2-9)    PHQ-2 Score: 0  Alcohol Screen: Low Risk (11/25/2021)   Alcohol Screen    Last Alcohol Screening Score (AUDIT): 0  Housing: Low Risk (11/25/2021)   Housing    Last Housing Risk Score: 0  Utilities: Not on file  Health Literacy: Not on file    Outpatient Medications Prior to Visit  Medication Sig Dispense Refill   amLODipine  (NORVASC ) 10 MG tablet Take 1 tablet (10 mg total) by mouth daily. 90 tablet 1   aspirin  EC 81 MG tablet Take 81 mg by mouth every morning.      atorvastatin  (LIPITOR ) 80 MG tablet Take 1 tablet (80 mg total) by mouth daily. 90 tablet 3   Blood Pressure Monitoring (BLOOD PRESSURE MONITOR AUTOMAT) DEVI 1 Device by Does not apply route daily. Check blood pressure daily 1 each 0   cyanocobalamin (VITAMIN B12) 1000 MCG tablet Take 1,000 mcg by mouth daily.     ezetimibe  (ZETIA ) 10 MG tablet Take 1 tablet (10 mg total) by mouth daily. 90 tablet 3   hydrochlorothiazide  (HYDRODIURIL ) 12.5 MG tablet Take 1 tablet (12.5 mg total) by mouth daily. 90 tablet 3   ibuprofen (ADVIL) 200 MG tablet Take 400 mg by mouth every 6 (six) hours as needed for headache or moderate pain.     metoprolol  tartrate (LOPRESSOR ) 25 MG tablet Take 1 tablet (25 mg total) by mouth daily. 180 tablet 1   Multiple Vitamin (MULTIVITAMIN WITH MINERALS) TABS tablet Take 1 tablet by mouth every morning.     olmesartan  (BENICAR ) 20 MG tablet Take 1 tablet (20 mg total) by mouth daily. 90 tablet 3   Omega 3 1000 MG CAPS Take 1,000 mg by mouth daily.     vitamin E 400 UNIT capsule Take 400 Units by mouth daily.     No  facility-administered medications prior to visit.    Allergies  Allergen Reactions   Codeine     REACTION:  Makes me high and blacked out in the 1980s    ROS Review of Systems  Constitutional:  Negative for chills and fever.  HENT:  Positive for congestion, sinus pressure, sore throat and tinnitus.        Hearing problem  Eyes:  Negative for pain and discharge.  Respiratory:  Positive for cough. Negative for shortness of breath.   Cardiovascular:  Negative for chest pain and palpitations.  Gastrointestinal:  Negative for abdominal pain, diarrhea, nausea and vomiting.  Endocrine: Negative for polydipsia and polyuria.  Genitourinary:  Negative for dysuria and hematuria.  Musculoskeletal:  Negative for neck pain and neck stiffness.  Skin:  Negative for rash.  Neurological:  Negative for dizziness and weakness.  Psychiatric/Behavioral:  Negative for agitation and behavioral problems.       Objective:    Physical Exam Vitals reviewed.  Constitutional:      General: She is not in acute distress.    Appearance: She is not diaphoretic.  HENT:     Head: Normocephalic and atraumatic.     Nose: Congestion present.     Right Sinus: Frontal sinus tenderness present.     Left Sinus: Frontal sinus tenderness present.     Mouth/Throat:     Mouth: Mucous membranes are moist.  Eyes:     General: No scleral icterus.    Extraocular Movements: Extraocular movements intact.  Cardiovascular:     Rate and Rhythm: Normal rate and regular rhythm.     Heart sounds: Normal heart sounds. No murmur heard. Pulmonary:     Breath sounds: Normal breath sounds. No wheezing or rales.  Musculoskeletal:     Cervical back: Neck supple. No tenderness.     Right lower leg: No edema.     Left lower leg:  No edema.  Skin:    General: Skin is warm.     Findings: No rash.  Neurological:     General: No focal deficit present.     Mental Status: She is alert and oriented to person, place, and time.      Sensory: No sensory deficit.     Motor: No weakness.  Psychiatric:        Mood and Affect: Mood normal.        Behavior: Behavior normal.     BP 128/68 (BP Location: Left Arm)   Pulse 90   Ht 5' 3 (1.6 m)   Wt 151 lb (68.5 kg)   LMP 05/16/2002   SpO2 93%   BMI 26.75 kg/m  Wt Readings from Last 3 Encounters:  11/07/24 151 lb (68.5 kg)  07/04/24 163 lb 12.8 oz (74.3 kg)  02/23/24 172 lb 9.6 oz (78.3 kg)    Lab Results  Component Value Date   TSH 2.090 08/11/2023   Lab Results  Component Value Date   WBC 6.0 07/04/2024   HGB 14.5 07/04/2024   HCT 43.7 07/04/2024   MCV 92 07/04/2024   PLT 358 07/04/2024   Lab Results  Component Value Date   NA 138 07/04/2024   K 3.3 (L) 07/04/2024   CO2 25 07/04/2024   GLUCOSE 114 (H) 07/04/2024   BUN 5 (L) 07/04/2024   CREATININE 0.93 07/04/2024   BILITOT 0.3 07/04/2024   ALKPHOS 121 07/04/2024   AST 22 07/04/2024   ALT 15 07/04/2024   PROT 6.9 07/04/2024   ALBUMIN 3.6 (L) 07/04/2024   CALCIUM  9.2 07/04/2024   ANIONGAP 7 12/12/2019   EGFR 67 07/04/2024   Lab Results  Component Value Date   CHOL 140 02/23/2024   Lab Results  Component Value Date   HDL 50 02/23/2024   Lab Results  Component Value Date   LDLCALC 72 02/23/2024   Lab Results  Component Value Date   TRIG 93 02/23/2024   Lab Results  Component Value Date   CHOLHDL 2.8 02/23/2024   Lab Results  Component Value Date   HGBA1C 6.2 (H) 07/04/2024      Assessment & Plan:   Problem List Items Addressed This Visit       Cardiovascular and Mediastinum   Essential hypertension   BP Readings from Last 1 Encounters:  11/07/24 128/68   Well-controlled with amlodipine , HCTZ and Olmesartan  Counseled for compliance with the medications Advised DASH diet and moderate exercise/walking, at least 150 mins/week      Coronary artery disease involving native heart without angina pectoris - Primary   S/p stent placement, followed by cardiology On aspirin   and statin On Lopressor  25 mg QD No anginal symptoms currently        Respiratory   Pulmonary emphysema (HCC)   Noted on CT chest Asymptomatic currently Advised to cut down -> quit smoking      Acute non-recurrent frontal sinusitis   Started empiric Augmentin  as she has persistent symptoms despite symptomatic treatment Advised to use nasal saline spray as needed for nasal congestion Advised to use vaporizer and/or sinus inhaler as needed for nasal congestion      Relevant Medications   amoxicillin -clavulanate (AUGMENTIN ) 875-125 MG tablet     Endocrine   Type 2 diabetes mellitus with other specified complication (HCC)   Lab Results  Component Value Date   HGBA1C 6.2 (H) 07/04/2024   Well-controlled with diet alone Associated with HTN, CAD, HLD and CKD  She would be an excellent candidate for GLP-1 agonist therapy considering her history of CAD and CKD - check HbA1c, she agrees to start Ozempic only if HbA1c is elevated > 7 Advised to follow diabetic diet On ARB and statin F/u CMP and lipid panel Diabetic eye exam: Advised to follow up with Ophthalmology for diabetic eye exam      Relevant Orders   CMP14+EGFR   Hemoglobin A1c   Urine Microalbumin w/creat. ratio     Genitourinary   Stage 3a chronic kidney disease (HCC)   Previous 2 CMP showed GFR ~55-60, but has h/o poor PO fluid intake Avoid nephrotoxic agents Needs to improve fluid intake On Olmesartan  and HCTZ Checked urine microalbumin/creatinine ratio - has mild proteinuria, if persistent, plan to add Farxiga      Relevant Orders   CBC with Differential/Platelet   CMP14+EGFR     Other   Hyperlipidemia   On statin, Zetia  and omega-3 Checked lipid profile - LDL 72 Needs to continue to follow low carb diet           Meds ordered this encounter  Medications   amoxicillin -clavulanate (AUGMENTIN ) 875-125 MG tablet    Sig: Take 1 tablet by mouth 2 (two) times daily.    Dispense:  14 tablet     Refill:  0    Follow-up: Return in about 4 months (around 03/08/2025) for DM and CKD.    Suzzane MARLA Blanch, MD "

## 2024-11-07 NOTE — Assessment & Plan Note (Signed)
 Previous 2 CMP showed GFR ~55-60, but has h/o poor PO fluid intake Avoid nephrotoxic agents Needs to improve fluid intake On Olmesartan  and HCTZ Checked urine microalbumin/creatinine ratio - has mild proteinuria, if persistent, plan to add Farxiga

## 2024-11-08 DIAGNOSIS — J0110 Acute frontal sinusitis, unspecified: Secondary | ICD-10-CM | POA: Insufficient documentation

## 2024-11-08 NOTE — Assessment & Plan Note (Signed)
 Started empiric Augmentin  as she has persistent symptoms despite symptomatic treatment Advised to use nasal saline spray as needed for nasal congestion Advised to use vaporizer and/or sinus inhaler as needed for nasal congestion

## 2024-11-08 NOTE — Assessment & Plan Note (Signed)
 BP Readings from Last 1 Encounters:  11/07/24 128/68   Well-controlled with amlodipine , HCTZ and Olmesartan  Counseled for compliance with the medications Advised DASH diet and moderate exercise/walking, at least 150 mins/week

## 2025-02-26 ENCOUNTER — Ambulatory Visit

## 2025-03-02 ENCOUNTER — Ambulatory Visit

## 2025-03-08 ENCOUNTER — Ambulatory Visit: Payer: Self-pay | Admitting: Internal Medicine
